# Patient Record
Sex: Male | Born: 1965 | Race: White | Hispanic: No | Marital: Married | Smoking: Never smoker
Health system: Southern US, Community
[De-identification: ages and names within clinical notes are randomized; demographics above are authoritative.]

## PROBLEM LIST (undated history)

## (undated) DIAGNOSIS — R519 Headache, unspecified: Secondary | ICD-10-CM

## (undated) DIAGNOSIS — T7840XA Allergy, unspecified, initial encounter: Secondary | ICD-10-CM

## (undated) DIAGNOSIS — G4730 Sleep apnea, unspecified: Secondary | ICD-10-CM

## (undated) DIAGNOSIS — J309 Allergic rhinitis, unspecified: Secondary | ICD-10-CM

## (undated) DIAGNOSIS — E119 Type 2 diabetes mellitus without complications: Secondary | ICD-10-CM

## (undated) DIAGNOSIS — R51 Headache: Secondary | ICD-10-CM

## (undated) DIAGNOSIS — E781 Pure hyperglyceridemia: Secondary | ICD-10-CM

## (undated) DIAGNOSIS — M109 Gout, unspecified: Secondary | ICD-10-CM

## (undated) HISTORY — DX: Allergic rhinitis, unspecified: J30.9

## (undated) HISTORY — DX: Allergy, unspecified, initial encounter: T78.40XA

## (undated) HISTORY — PX: POLYPECTOMY: SHX149

## (undated) HISTORY — PX: CHOLECYSTECTOMY: SHX55

## (undated) HISTORY — DX: Headache: R51

## (undated) HISTORY — DX: Type 2 diabetes mellitus without complications: E11.9

## (undated) HISTORY — DX: Gout, unspecified: M10.9

## (undated) HISTORY — PX: APPENDECTOMY: SHX54

## (undated) HISTORY — PX: SEPTOPLASTY: SUR1290

## (undated) HISTORY — DX: Headache, unspecified: R51.9

---

## 2007-07-26 HISTORY — PX: ROTATOR CUFF REPAIR: SHX139

## 2012-08-16 ENCOUNTER — Encounter (HOSPITAL_COMMUNITY): Payer: Self-pay | Admitting: Emergency Medicine

## 2012-08-16 ENCOUNTER — Emergency Department (HOSPITAL_COMMUNITY)
Admission: EM | Admit: 2012-08-16 | Discharge: 2012-08-16 | Disposition: A | Discharge status: Home / Self Care | Attending: Emergency Medicine | Admitting: Emergency Medicine

## 2012-08-16 DIAGNOSIS — Z79899 Other long term (current) drug therapy: Secondary | ICD-10-CM | POA: Insufficient documentation

## 2012-08-16 DIAGNOSIS — M545 Low back pain, unspecified: Secondary | ICD-10-CM | POA: Insufficient documentation

## 2012-08-16 DIAGNOSIS — Z9089 Acquired absence of other organs: Secondary | ICD-10-CM | POA: Insufficient documentation

## 2012-08-16 DIAGNOSIS — R109 Unspecified abdominal pain: Secondary | ICD-10-CM | POA: Insufficient documentation

## 2012-08-16 DIAGNOSIS — Z8639 Personal history of other endocrine, nutritional and metabolic disease: Secondary | ICD-10-CM | POA: Insufficient documentation

## 2012-08-16 DIAGNOSIS — Z862 Personal history of diseases of the blood and blood-forming organs and certain disorders involving the immune mechanism: Secondary | ICD-10-CM | POA: Insufficient documentation

## 2012-08-16 DIAGNOSIS — M549 Dorsalgia, unspecified: Secondary | ICD-10-CM

## 2012-08-16 HISTORY — DX: Pure hyperglyceridemia: E78.1

## 2012-08-16 LAB — POCT I-STAT, CHEM 8
BUN: 26 mg/dL — ABNORMAL HIGH (ref 6–23)
Chloride: 109 mEq/L (ref 96–112)
Creatinine, Ser: 1.1 mg/dL (ref 0.50–1.35)
Potassium: 4.2 mEq/L (ref 3.5–5.1)
Sodium: 142 mEq/L (ref 135–145)

## 2012-08-16 LAB — URINALYSIS, ROUTINE W REFLEX MICROSCOPIC
Glucose, UA: 100 mg/dL — AB
Hgb urine dipstick: NEGATIVE
Leukocytes, UA: NEGATIVE
Protein, ur: NEGATIVE mg/dL
Specific Gravity, Urine: 1.026 (ref 1.005–1.030)
Urobilinogen, UA: 1 mg/dL (ref 0.0–1.0)

## 2012-08-16 LAB — CBC WITH DIFFERENTIAL/PLATELET
Basophils Absolute: 0 10*3/uL (ref 0.0–0.1)
Lymphocytes Relative: 28 % (ref 12–46)
Lymphs Abs: 1.1 10*3/uL (ref 0.7–4.0)
Neutro Abs: 2.4 10*3/uL (ref 1.7–7.7)
Platelets: 199 10*3/uL (ref 150–400)
RBC: 5.22 MIL/uL (ref 4.22–5.81)
RDW: 12.9 % (ref 11.5–15.5)
WBC: 4 10*3/uL (ref 4.0–10.5)

## 2012-08-16 MED ORDER — HYDROCODONE-ACETAMINOPHEN 5-325 MG PO TABS: ORAL_TABLET | ORAL | Status: DC

## 2012-08-16 NOTE — ED Notes (Addendum)
Patient with lower back pain, on right flank.  No nausea or vomiting at this time.  No problems urinating.  Urine is clear per patient.  Pain is worse with movement.

## 2012-08-16 NOTE — ED Notes (Signed)
1000cc of NS started wide open

## 2012-08-16 NOTE — ED Notes (Signed)
Report given to Ricky, RN

## 2012-08-16 NOTE — ED Provider Notes (Signed)
History     CSN: [id number]  Arrival date & time 08/16/12  1191   First MD Initiated Contact with Patient 08/16/12 [phone]      Chief Complaint  Patient presents with  . Back Pain    (Consider location/radiation/quality/duration/timing/severity/associated sxs/prior treatment) HPI Comments: Patient presents with complaint of right flank pain over the 4 days that is has been moving from his flank to his right lower abdomen and groin. Pain has been 7/10 at its worst, currently 4/10. No urine or stool changes. No fever, nausea, vomiting, or diarrhea. Patient does not have a history of kidney stones. He has a history of appendectomy. Patient takes Flexeril and Mobic at home for pain and headaches. No other medications. Onset acute. Course is waxing and waning. Nothing makes symptoms better or worse.   Patient is a 47 y.o. male presenting with back pain. The history is provided by the patient and the spouse.  Back Pain  Pertinent negatives include no chest pain, no fever, no headaches, no abdominal pain and no dysuria.    Past Medical History  Diagnosis Date  . Hypertriglyceridemia     Past Surgical History  Procedure Date  . Appendectomy   . Septoplasty     History reviewed. No pertinent family history.  History  Substance Use Topics  . Smoking status: Never Smoker   . Smokeless tobacco: Not on file  . Alcohol Use: No      Review of Systems  Constitutional: Negative for fever.  HENT: Negative for sore throat and rhinorrhea.   Eyes: Negative for redness.  Respiratory: Negative for cough.   Cardiovascular: Negative for chest pain.  Gastrointestinal: Negative for nausea, vomiting, abdominal pain and diarrhea.  Genitourinary: Positive for flank pain. Negative for dysuria, hematuria, decreased urine volume and discharge.  Musculoskeletal: Positive for back pain. Negative for myalgias.  Skin: Negative for rash.  Neurological: Negative for headaches.    Allergies  Review of  patient's allergies indicates no known allergies.  Home Medications   Current Outpatient Rx  Name  Route  Sig  Dispense  Refill  . BACLOFEN 10 MG PO TABS   Oral   Take 10 mg by mouth 3 (three) times daily.         Marland Kitchen CLONAZEPAM 1 MG PO TABS   Oral   Take 1 mg by mouth 2 (two) times daily as needed. For anxiety         . CYCLOBENZAPRINE HCL 10 MG PO TABS   Oral   Take 10 mg by mouth 3 (three) times daily as needed. For pain         . MELOXICAM 15 MG PO TABS   Oral   Take 15 mg by mouth daily.         Marland Kitchen HYDROCODONE-ACETAMINOPHEN 5-325 MG PO TABS      Take 1-2 tablets every 6 hours as needed for severe pain   12 tablet   0     BP 122/62  Pulse 79  Temp 97.9 F (36.6 C) (Oral)  Resp 18  SpO2 98%  Physical Exam  Nursing note and vitals reviewed. Constitutional: He appears well-developed and well-nourished.  HENT:  Head: Normocephalic and atraumatic.  Eyes: Conjunctivae normal are normal. Right eye exhibits no discharge. Left eye exhibits no discharge.  Neck: Normal range of motion. Neck supple.  Cardiovascular: Normal rate, regular rhythm and normal heart sounds.   Pulmonary/Chest: Effort normal and breath sounds normal.  Abdominal: Soft. There is no  tenderness.  Genitourinary: Penis normal. Right testis shows no swelling and no tenderness. Left testis shows no swelling and no tenderness. No discharge found.  Musculoskeletal:       Thoracic back: He exhibits no tenderness and no bony tenderness.       Lumbar back: He exhibits tenderness. He exhibits normal range of motion, no bony tenderness and no swelling.       Back:  Neurological: He is alert.  Skin: Skin is warm and dry.  Psychiatric: He has a normal mood and affect.    ED Course  Procedures (including critical care time)  Labs Reviewed  URINALYSIS, ROUTINE W REFLEX MICROSCOPIC - Abnormal; Notable for the following:    Glucose, UA 100 (*)     All other components within normal limits  POCT  I-STAT, CHEM 8 - Abnormal; Notable for the following:    BUN 26 (*)     Glucose, Bld 131 (*)     All other components within normal limits  CBC WITH DIFFERENTIAL   No results found.   1. Back pain     7:14 AM Patient seen and examined. Work-up initiated. Patient declines pain medication.   Vital signs reviewed and are as follows: Filed Vitals:   08/16/12 0655  BP: 122/62  Pulse: 79  Temp: 97.9 F (36.6 C)  Resp: 18   8:42 AM Updated patient on results. He is pain free unless he moves.   Patient given option of symptomatic management vs CT to ensure no stone. He elects symptomatic management. Will d/c on pain medication and close f/u with his PCP.  Patient urged to return with worsening symptoms or other concerns. Patient verbalized understanding and agrees with plan.   Patient counseled on use of narcotic pain medications. Counseled not to combine these medications with others containing tylenol. Urged not to drink alcohol, drive, or perform any other activities that requires focus while taking these medications. The patient verbalizes understanding and agrees with the plan.   Wife states she will monitor and have patient f/u.    MDM  Patient with back pain, positional, seems musculoskeletal. Nephrolithiasis is also a possibility however given the positional nature with negative UA, normal kidney function, there is no indication for CT at this current time. Patient appears well. Will continue symptomatic control and have patient follow up with his primary care physician. He should and family are in agreement with this plan. Do not suspect PE based on history, risk factors, and age.        Renne Crigler, [place] 08/16/12 [phone]

## 2012-08-16 NOTE — ED Provider Notes (Signed)
Medical screening examination/treatment/procedure(s) were performed by non-physician practitioner and as supervising physician I was immediately available for consultation/collaboration.   Carleene Cooper III, MD 08/16/12 2041

## 2014-06-05 ENCOUNTER — Other Ambulatory Visit (HOSPITAL_COMMUNITY): Payer: Self-pay | Admitting: Respiratory Therapy

## 2014-06-05 DIAGNOSIS — R0602 Shortness of breath: Secondary | ICD-10-CM

## 2014-06-16 ENCOUNTER — Ambulatory Visit (HOSPITAL_COMMUNITY)
Admission: RE | Admit: 2014-06-16 | Discharge: 2014-06-16 | Disposition: A | Discharge status: Home / Self Care | Source: Ambulatory Visit | Attending: Internal Medicine | Admitting: Internal Medicine

## 2014-06-16 DIAGNOSIS — R0602 Shortness of breath: Secondary | ICD-10-CM | POA: Diagnosis present

## 2014-06-16 LAB — PULMONARY FUNCTION TEST
DL/VA % PRED: 125 %
DL/VA: 5.75 ml/min/mmHg/L
DLCO COR: 35.91 ml/min/mmHg
DLCO UNC % PRED: 115 %
DLCO UNC: 35.91 ml/min/mmHg
DLCO cor % pred: 115 %
FEF 25-75 PRE: 4.36 L/s
FEF2575-%Pred-Pre: 127 %
FEV1-%Pred-Pre: 103 %
FEV1-Pre: 3.94 L
FEV1FVC-%PRED-PRE: 106 %
FEV6-%PRED-PRE: 99 %
FEV6-Pre: 4.7 L
FEV6FVC-%PRED-PRE: 102 %
FVC-%Pred-Pre: 96 %
FVC-Pre: 4.73 L
PRE FEV1/FVC RATIO: 83 %
Pre FEV6/FVC Ratio: 99 %
RV % pred: 77 %
RV: 1.52 L
TLC % pred: 93 %
TLC: 6.35 L

## 2014-06-16 MED ORDER — ALBUTEROL SULFATE (2.5 MG/3ML) 0.083% IN NEBU
2.5000 mg | INHALATION_SOLUTION | Freq: Once | RESPIRATORY_TRACT | Status: AC
  Administered 2014-06-16: 2.5 mg via RESPIRATORY_TRACT

## 2014-07-25 LAB — HM COLONOSCOPY

## 2015-09-23 ENCOUNTER — Encounter: Payer: Self-pay | Admitting: Primary Care

## 2015-09-23 ENCOUNTER — Ambulatory Visit (INDEPENDENT_AMBULATORY_CARE_PROVIDER_SITE_OTHER): Admitting: Primary Care

## 2015-09-23 VITALS — BP 144/86 | HR 97 | Temp 98.9°F | Ht 69.0 in | Wt 255.4 lb

## 2015-09-23 DIAGNOSIS — E1165 Type 2 diabetes mellitus with hyperglycemia: Secondary | ICD-10-CM | POA: Insufficient documentation

## 2015-09-23 DIAGNOSIS — J309 Allergic rhinitis, unspecified: Secondary | ICD-10-CM | POA: Diagnosis not present

## 2015-09-23 DIAGNOSIS — R51 Headache: Secondary | ICD-10-CM

## 2015-09-23 DIAGNOSIS — E119 Type 2 diabetes mellitus without complications: Secondary | ICD-10-CM

## 2015-09-23 DIAGNOSIS — E785 Hyperlipidemia, unspecified: Secondary | ICD-10-CM | POA: Insufficient documentation

## 2015-09-23 DIAGNOSIS — Z794 Long term (current) use of insulin: Secondary | ICD-10-CM

## 2015-09-23 DIAGNOSIS — R519 Headache, unspecified: Secondary | ICD-10-CM | POA: Insufficient documentation

## 2015-09-23 NOTE — Assessment & Plan Note (Signed)
No prior history. Symptoms of nasal congestion x 1 month. No improvement with Dymista. Will have him stop Afrin, switch to Flonase, add Zyrtec HS.

## 2015-09-23 NOTE — Assessment & Plan Note (Signed)
Diagnosed 4-5 years ago. Currently managed on Janumet 100/1000, Metformin 1000, and Levemir 45 units HS. Poor diet. Does not exercise. Unsure of his last A1C, believes he may be due, Will have him schedule labs in the next month, in the meantime will obtain records.

## 2015-09-23 NOTE — Patient Instructions (Signed)
Nasal Congestion: Try using Flonase (fluticasone) nasal spray. Instill 2 sprays in each nostril once daily.   Start taking Zyrtec every night at bedtime. This is an antihistamine which will help to reduce your congestion.  I will obtain records from Dr. Julianne Rice office.  Please schedule a physical with me within the next 3-6 months. You may also schedule a lab only appointment 3-4 days prior. We will discuss your lab results in detail during your physical.  It was a pleasure to meet you today! Please don't hesitate to call me with any questions. Welcome to [facility]!

## 2015-09-23 NOTE — Progress Notes (Signed)
Subjective:    Patient ID: Devon Jackson, male    DOB: 04-12-1966, 50 y.o.   MRN: [id number]  HPI  Mr. Wydra is a 50 year old male who presents today to establish care and discuss the problems mentioned below. Will obtain old records. He's unsure of his last physical.   1) Type 2 Diabetes: Diagnosed about 4 years ago. Currently managed on Janumet XR 100/1000mg  once daily, Levemir 45 units HS, and Metformin 1000 mg every morning. His last A1C was about 3 months ago and was approximately 7 per patient.   He endorses a fair diet which consists of: Breakfast: Fast food Lunch: Fast food Dinner: Home cooked meals (casserole, ham, pasta, etc) Snacks: Chips, popcorn, jerky Desserts: None Beverages: Diet pepsi, occasional sweet tea.  Exercise: He does not currently exercise.  2) Frequent Headaches: Chronic for years. He is managed on a few medications for his headaches by prior PCP. He will start by taking Aleve. If no improvement then will take 1 flexeril. If no improvement then will taken an additional flexeril. If his headache persists he will take 1 clonopin tablet.   Headaches occur once weekly on average. Most of the time the aleve will provide relief in pain, and will occasionally have to take flexeril. Headaches are located to the left occipital region mostly. Denies photophobia and nausea. His clonopin RX is from over 1.5 years ago.  3) Gout: Diagnosed years ago. Has a prescription for colchicine 0.6 mg PRN. His last use of the colchicine was about year ago. No recent symptoms.  4) Hyperlipidemia: Diagnosed years ago. Currently managed on fenofibrate for elevated triglycerides. Unsure of last lipid panel. He was told that he was going to be started on a new prescription strength fish oil pill but this required prior authorization from his insurance. He's been trialed on Lovaza, gemfibrozil, and Lipitor in the past. Unable to tolerate Lipitor as it caused moderate muscle cramping.   5)  Allergic Rhinitis: Nasal congestion. Intermittently for the past one month. He's been using Afrin nasal spray and Dymista . The dymista did not help to improve symptoms, the Afrin provides temporary relief. Denies fevers, cough, sinus pressure, prior history of this in the past.  Review of Systems  Constitutional: Negative for fever.  HENT: Positive for congestion.   Respiratory: Negative for shortness of breath.   Cardiovascular: Negative for chest pain.  Musculoskeletal: Negative for arthralgias.  Allergic/Immunologic: Positive for environmental allergies.  Neurological: Positive for headaches. Negative for numbness.       Past Medical History  Diagnosis Date  . Hypertriglyceridemia     Social History   Social History  . Marital Status: Married    Spouse Name: N/A  . Number of Children: N/A  . Years of Education: N/A   Occupational History  . Not on file.   Social History Main Topics  . Smoking status: Never Smoker   . Smokeless tobacco: Not on file  . Alcohol Use: 0.0 oz/week    0 Standard drinks or equivalent per week     Comment: rarely  . Drug Use: No  . Sexual Activity: Not on file   Other Topics Concern  . Not on file   Social History Narrative   Married.   3 children.   Works as a Therapist, nutritional.   Enjoys relaxing, spending time outdoors.    Past Surgical History  Procedure Laterality Date  . Appendectomy    . Septoplasty    . Rotator cuff  repair  2009  . Polypectomy      Family History  Problem Relation Age of Onset  . Alcohol abuse Mother   . Diabetes Father   . Breast cancer Paternal Grandmother   . Lung cancer Paternal Grandfather     Allergies  Allergen Reactions  . Atorvastatin     Other reaction(s): Myalgias (intolerance)    Current Outpatient Prescriptions on File Prior to Visit  Medication Sig Dispense Refill  . clonazePAM (KLONOPIN) 1 MG tablet Take 1 mg by mouth as needed. For anxiety    . cyclobenzaprine (FLEXERIL) 10 MG  tablet Take 10 mg by mouth as needed. For pain    . meloxicam (MOBIC) 15 MG tablet Take 15 mg by mouth daily.     No current facility-administered medications on file prior to visit.    BP 144/86 mmHg  Pulse 97  Temp(Src) 98.9 F (37.2 C) (Oral)  Ht 5\' 9"  (1.753 m)  Wt 255 lb 6.4 oz (115.849 kg)  BMI 37.70 kg/m2  SpO2 94%    Objective:   Physical Exam  Constitutional: He appears well-nourished.  HENT:  Right Ear: Tympanic membrane and ear canal normal.  Left Ear: Tympanic membrane and ear canal normal.  Nose: Mucosal edema present.  Mouth/Throat: Oropharynx is clear and moist.  Neck: Neck supple.  Cardiovascular: Normal rate and regular rhythm.   Pulmonary/Chest: Effort normal and breath sounds normal.  Skin: Skin is warm and dry.  Psychiatric: He has a normal mood and affect.          Assessment & Plan:

## 2015-09-23 NOTE — Progress Notes (Signed)
Pre visit review using our clinic review tool, if applicable. No additional management support is needed unless otherwise documented below in the visit note. 

## 2015-09-23 NOTE — Assessment & Plan Note (Signed)
More so elevation in trigs. Managed on fenofibrate 150 mg. Failed treatment on gemfibrozil, lipitor (myalgias), and lovaza. Will obtain records and repeat lipids at upcoming physical.

## 2015-09-23 NOTE — Assessment & Plan Note (Signed)
Once weekly for years. Takes aleve mostly, flexeril occasionally, clonopin rarely.

## 2015-09-24 ENCOUNTER — Telehealth: Payer: Self-pay | Admitting: Primary Care

## 2015-11-13 ENCOUNTER — Other Ambulatory Visit: Payer: Self-pay

## 2015-11-13 MED ORDER — FREESTYLE LANCETS MISC: Status: DC

## 2015-11-13 MED ORDER — GLUCOSE BLOOD VI STRP: ORAL_STRIP | Status: DC

## 2015-11-13 NOTE — Telephone Encounter (Signed)
V/M left requesting test strips and lancets sent to [facility]. I spoke with pt and he uses freestyle insulinx test strip and lancets and checks BS 2-3 times a day. Pt seen 09/23/2015. Advised pt done.

## 2015-11-18 ENCOUNTER — Other Ambulatory Visit: Payer: Self-pay | Admitting: Primary Care

## 2015-11-18 DIAGNOSIS — Z125 Encounter for screening for malignant neoplasm of prostate: Secondary | ICD-10-CM

## 2015-11-18 DIAGNOSIS — M109 Gout, unspecified: Secondary | ICD-10-CM

## 2015-11-18 DIAGNOSIS — E119 Type 2 diabetes mellitus without complications: Secondary | ICD-10-CM

## 2015-11-18 DIAGNOSIS — E785 Hyperlipidemia, unspecified: Secondary | ICD-10-CM

## 2015-11-20 ENCOUNTER — Other Ambulatory Visit: Payer: Self-pay | Admitting: Primary Care

## 2015-11-20 ENCOUNTER — Telehealth: Payer: Self-pay | Admitting: Primary Care

## 2015-11-20 ENCOUNTER — Encounter: Payer: Self-pay | Admitting: Primary Care

## 2015-11-20 NOTE — Telephone Encounter (Signed)
Will you please check on the status of Mr. Devon Jackson test strips through [facility]? He is having difficulty. Thanks.

## 2015-11-20 NOTE — Telephone Encounter (Signed)
Pt left v/m requesting cb about PA for test strips; see 11/20/15 pt email also.

## 2015-11-23 ENCOUNTER — Other Ambulatory Visit (INDEPENDENT_AMBULATORY_CARE_PROVIDER_SITE_OTHER)

## 2015-11-23 DIAGNOSIS — E119 Type 2 diabetes mellitus without complications: Secondary | ICD-10-CM

## 2015-11-23 DIAGNOSIS — M109 Gout, unspecified: Secondary | ICD-10-CM

## 2015-11-23 DIAGNOSIS — Z125 Encounter for screening for malignant neoplasm of prostate: Secondary | ICD-10-CM | POA: Diagnosis not present

## 2015-11-23 DIAGNOSIS — E785 Hyperlipidemia, unspecified: Secondary | ICD-10-CM

## 2015-11-23 LAB — COMPREHENSIVE METABOLIC PANEL
ALT: 21 U/L (ref 0–53)
AST: 14 U/L (ref 0–37)
Albumin: 4.4 g/dL (ref 3.5–5.2)
Alkaline Phosphatase: 47 U/L (ref 39–117)
BUN: 22 mg/dL (ref 6–23)
CALCIUM: 9.3 mg/dL (ref 8.4–10.5)
CHLORIDE: 105 meq/L (ref 96–112)
CO2: 28 meq/L (ref 19–32)
CREATININE: 1.09 mg/dL (ref 0.40–1.50)
GFR: 75.84 mL/min (ref >60.00)
Glucose, Bld: 165 mg/dL — ABNORMAL HIGH (ref 70–99)
POTASSIUM: 4.6 meq/L (ref 3.5–5.1)
Sodium: 139 mEq/L (ref 135–145)
Total Bilirubin: 0.5 mg/dL (ref 0.2–1.2)
Total Protein: 6.6 g/dL (ref 6.0–8.3)

## 2015-11-23 LAB — URIC ACID: Uric Acid, Serum: 3.6 mg/dL — ABNORMAL LOW (ref 4.0–7.8)

## 2015-11-23 LAB — LIPID PANEL
CHOL/HDL RATIO: 6
CHOLESTEROL: 161 mg/dL (ref 0–200)
HDL: 27.6 mg/dL — AB (ref >39.00)
NonHDL: 133.85
TRIGLYCERIDES: 249 mg/dL — AB (ref 0.0–149.0)
VLDL: 49.8 mg/dL — AB (ref 0.0–40.0)

## 2015-11-23 LAB — MICROALBUMIN / CREATININE URINE RATIO
Creatinine,U: 137.5 mg/dL
Microalb Creat Ratio: 0.5 mg/g (ref 0.0–30.0)

## 2015-11-23 LAB — PSA: PSA: 0.44 ng/mL (ref 0.10–4.00)

## 2015-11-23 LAB — LDL CHOLESTEROL, DIRECT: LDL DIRECT: 107 mg/dL

## 2015-11-23 LAB — HEMOGLOBIN A1C: Hgb A1c MFr Bld: 8.2 % — ABNORMAL HIGH (ref 4.6–6.5)

## 2015-11-23 MED ORDER — GLUCOSE BLOOD VI STRP: ORAL_STRIP | Status: DC

## 2015-11-23 NOTE — Addendum Note (Signed)
Addended by: Jacqualin Combes on: 11/23/2015 11:41 AM   Modules accepted: Orders

## 2015-11-23 NOTE — Telephone Encounter (Signed)
Did not get anything that would be faxed from [facility] for patient. Called [facility] and asked them to re-fax the forms for the PA.

## 2015-11-23 NOTE — Telephone Encounter (Signed)
Received the forms from [place]. Completed the forms and faxed to [facility]. Now waiting on response.

## 2015-11-23 NOTE — Telephone Encounter (Deleted)
Did not get anything from [facility] regarding PA. Will sent PA for the test strips.

## 2015-11-25 MED ORDER — BLOOD GLUCOSE MONITOR KIT: PACK | Status: AC

## 2015-11-25 NOTE — Telephone Encounter (Signed)
Received fax from [place] that the prior auth for freestyle insulinx test strip

## 2015-11-25 NOTE — Telephone Encounter (Signed)
Called and notified patient of the outcome of the prior auth. Patient verbalized understanding. Patient stated that can we send him a new meter and test strips that the insurance would cover. Per Anda Kraft, okay to send.

## 2015-11-25 NOTE — Telephone Encounter (Signed)
Already faxed the Rx for the new meter and test strips.

## 2015-11-26 ENCOUNTER — Ambulatory Visit (INDEPENDENT_AMBULATORY_CARE_PROVIDER_SITE_OTHER): Admitting: Primary Care

## 2015-11-26 ENCOUNTER — Encounter: Payer: Self-pay | Admitting: Primary Care

## 2015-11-26 VITALS — BP 126/80 | HR 82 | Temp 98.2°F | Ht 69.0 in | Wt 255.4 lb

## 2015-11-26 DIAGNOSIS — E785 Hyperlipidemia, unspecified: Secondary | ICD-10-CM | POA: Diagnosis not present

## 2015-11-26 DIAGNOSIS — J309 Allergic rhinitis, unspecified: Secondary | ICD-10-CM

## 2015-11-26 DIAGNOSIS — Z0001 Encounter for general adult medical examination with abnormal findings: Secondary | ICD-10-CM | POA: Insufficient documentation

## 2015-11-26 DIAGNOSIS — E119 Type 2 diabetes mellitus without complications: Secondary | ICD-10-CM

## 2015-11-26 DIAGNOSIS — Z794 Long term (current) use of insulin: Secondary | ICD-10-CM

## 2015-11-26 DIAGNOSIS — Z0000 Encounter for general adult medical examination without abnormal findings: Secondary | ICD-10-CM | POA: Insufficient documentation

## 2015-11-26 MED ORDER — METFORMIN HCL 1000 MG PO TABS: 1000.0000 mg | ORAL_TABLET | Freq: Every day | ORAL | Status: DC

## 2015-11-26 MED ORDER — JANUMET XR 100-1000 MG PO TB24: 1.0000 | ORAL_TABLET | Freq: Every day | ORAL | Status: DC

## 2015-11-26 MED ORDER — INSULIN DETEMIR 100 UNIT/ML ~~LOC~~ SOLN: SUBCUTANEOUS | Status: DC

## 2015-11-26 MED ORDER — MONTELUKAST SODIUM 10 MG PO TABS: 10.0000 mg | ORAL_TABLET | Freq: Every day | ORAL | Status: DC

## 2015-11-26 NOTE — Assessment & Plan Note (Signed)
TC stable, LDL slightly above goal, Trigs at 250. Managed on fenofibrate. He's failed Lovaza, Gemfibrozil, and experienced allergic reaction to Lipitor in the past. Will continue Fenofibrate now, add in OTC fish oil 1000 mg TID. Repeat Lipids in 3 months. If no improvement, will need to consider Crestor.  Discussed the importance of a healthy diet and regular exercise in order for weight loss and to reduce risk of other medical diseases.

## 2015-11-26 NOTE — Assessment & Plan Note (Signed)
A1C of 8.2 on recent labs which is worse from prior. Poor diet and does not regularly exercise. Will increase Levemir to 15 units in the morning and continue 45 units in the evening. Continue JanuMet and Metformin. Declines pneumonia vaccination today. Urine microalbumin on file and is unremarkable. Not managed on Ace. Cannot tolerate statins.  Discussed the importance of a healthy diet and regular exercise in order for weight loss and to reduce risk of other medical diseases.  Will repeat A1C in 3 months.

## 2015-11-26 NOTE — Progress Notes (Signed)
Subjective:    Patient ID: Devon Jackson, male    DOB: 1965/08/27, 50 y.o.   MRN: [id number]  HPI  Devon Jackson is a 50 year old male who presents today for complete physical.  Immunizations: -Tetanus: Unsure believes it's been within 10 years -Influenza: Did not complete last season -Pneumonia: Never completed.   Diet: He endorses a healthy diet. Breakfast: Sausage egg Mcmuffin, hashbrowns, soda Lunch: Fast food Dinner: Chicken casseroles, beef tips, vegetables Snacks: Hot fries, popcorn, pickles, hot dogs Desserts: Occasionally Beverages: Diet sodas, sweet tea, no water  Exercise: He does some mild weight lifting some days weekly. Eye exam: Completed 2 years ago, due again in June. Dental exam: Completed yesterday. Colonoscopy: Completed in 2016, due for repeat in 1 year.  1) Cough: Present for 1 week. Had cold symptoms for the past 2 weeks which resolved. Overall he's feeling better from the cold and does not feel sick today. He continues to experience nasal congestion, sneezing. He has been using his albuterol inhaler for the past several days with temporary improvement as he can get a deeper breath in to cough. Denies fevers. He's taken Sudafed, Flonase, and Zyrtec in the past without improvement.   Review of Systems  Constitutional: Positive for fatigue. Negative for fever.  HENT: Positive for congestion and rhinorrhea.   Respiratory: Positive for cough, shortness of breath and wheezing.   Cardiovascular: Negative for chest pain.  Gastrointestinal: Negative for diarrhea and constipation.  Genitourinary: Negative for difficulty urinating.  Musculoskeletal: Negative for myalgias and arthralgias.  Skin: Negative for rash.  Allergic/Immunologic: Positive for environmental allergies.  Neurological: Negative for dizziness, numbness and headaches.  Psychiatric/Behavioral:       Denies concerns with anxiety and depression       Past Medical History  Diagnosis Date  .  Hypertriglyceridemia   . Gout   . Frequent headaches   . Type 2 diabetes mellitus ([place])   . Allergic rhinitis      Social History   Social History  . Marital Status: Married    Spouse Name: N/A  . Number of Children: N/A  . Years of Education: N/A   Occupational History  . Not on file.   Social History Main Topics  . Smoking status: Never Smoker   . Smokeless tobacco: Not on file  . Alcohol Use: 0.0 oz/week    0 Standard drinks or equivalent per week     Comment: rarely  . Drug Use: No  . Sexual Activity: Not on file   Other Topics Concern  . Not on file   Social History Narrative   Married.   3 children.   Works as a Therapist, nutritional.   Enjoys relaxing, spending time outdoors.    Past Surgical History  Procedure Laterality Date  . Appendectomy    . Septoplasty    . Rotator cuff repair  2009  . Polypectomy      Family History  Problem Relation Age of Onset  . Alcohol abuse Mother   . Diabetes Father   . Breast cancer Paternal Grandmother   . Lung cancer Paternal Grandfather     Allergies  Allergen Reactions  . Atorvastatin     Other reaction(s): Myalgias (intolerance)    Current Outpatient Prescriptions on File Prior to Visit  Medication Sig Dispense Refill  . albuterol (PROAIR HFA) 108 (90 Base) MCG/ACT inhaler Inhale 2 puffs into the lungs every 4 (four) hours as needed.     . blood glucose meter  kit and supplies KIT Dispense based on patient and insurance preference. Use up to 3 times daily as directed. (Dx is E11.9). 1 each 0  . CIALIS 20 MG tablet Take 20 mg by mouth daily as needed.     . clonazePAM (KLONOPIN) 1 MG tablet Take 1 mg by mouth as needed. For anxiety    . clotrimazole-betamethasone (LOTRISONE) cream Apply topically.    . colchicine 0.6 MG tablet Take 0.6 mg by mouth as needed.     . cyclobenzaprine (FLEXERIL) 10 MG tablet Take 10 mg by mouth as needed. For pain    . DEXILANT 60 MG capsule Take 60 mg by mouth as needed.     .  Fenofibrate 150 MG CAPS Take 150 mg by mouth at bedtime.     Marland Kitchen glucose blood (FREESTYLE INSULINX TEST) test strip Test blood sugar 2-3 times daily and as directed. 100 each 5  . insulin detemir (LEVEMIR) 100 UNIT/ML injection Inject 45 Units into the skin at bedtime.    Marland Kitchen JANUMET XR [phone] MG TB24 Take 1 tablet by mouth at bedtime.     . Lancets (FREESTYLE) lancets Test blood sugar 2-3 times daily and as directed. Dx E11.9 300 each 1  . meloxicam (MOBIC) 15 MG tablet Take 15 mg by mouth daily.     No current facility-administered medications on file prior to visit.    BP 126/80 mmHg  Pulse 82  Temp(Src) 98.2 F (36.8 C) (Oral)  Ht _0  (1.753 m)  Wt 255 lb 6.4 oz (115.849 kg)  BMI 37.70 kg/m2  SpO2 96%    Objective:   Physical Exam  Constitutional: He is oriented to person, place, and time. He appears well-nourished.  HENT:  Right Ear: Tympanic membrane and ear canal normal.  Left Ear: Tympanic membrane and ear canal normal.  Nose: Nose normal. Right sinus exhibits no maxillary sinus tenderness and no frontal sinus tenderness. Left sinus exhibits no maxillary sinus tenderness and no frontal sinus tenderness.  Mouth/Throat: Oropharynx is clear and moist.  Eyes: Conjunctivae and EOM are normal. Pupils are equal, round, and reactive to light.  Neck: Neck supple. Carotid bruit is not present. No thyromegaly present.  Cardiovascular: Normal rate, regular rhythm and normal heart sounds.   Pulmonary/Chest: Effort normal and breath sounds normal. He has no wheezes. He has no rales.  Cough present during exam.  Abdominal: Soft. Bowel sounds are normal. There is no tenderness.  Musculoskeletal: Normal range of motion.  Neurological: He is alert and oriented to person, place, and time. He has normal reflexes. No cranial nerve deficit.  Skin: Skin is warm and dry.  Psychiatric: He has a normal mood and affect.          Assessment & Plan:

## 2015-11-26 NOTE — Progress Notes (Signed)
Pre visit review using our clinic review tool, if applicable. No additional management support is needed unless otherwise documented below in the visit note. 

## 2015-11-26 NOTE — Assessment & Plan Note (Signed)
No improvement with Zyrtec and Flonase. Now has cough x 1 week without evidence of viral or bacterial involvement. Will try Singulair 10 mg HS. He is to update me if no improvement.

## 2015-11-26 NOTE — Assessment & Plan Note (Signed)
Td UTD per patient, declines pneumovax. Colonoscopy completed in late 2016, due for repeat in 1 year due to numerous polyps. Poor diet and does not exercise. Stressed the importance of both, especially given diabetes and hyperlipidemia.  Exam with dry cough and evidence of allergic rhinitis. Labs with hyperglycemia and hyperlipidemia.  Follow up in 1 year for repeat physical, follow up in 6 months for re-evaluation of chronic conditions, repeat labs in 3 months.

## 2015-11-26 NOTE — Patient Instructions (Addendum)
Your blood sugars are too high. You MUST improve your diet.  Please limit carbohydrates in the form of white bread, rice, fast food, fried foods, sweets, sodas, etc. Increase your consumption of fresh fruits and vegetables, lean protein, and water.  Ensure you are consuming 64 ounces of water daily.  Start exercising. You should be getting 1 hour of moderate intensity exercise 5 days weekly.  Increase Levemir to 15 units in the morning. Continue 45 units in the evening.  Continue to check your sugars and notify me if you get any readings below 80.  Start Fish Oil 1000 mg three times daily with meals.   Complete xray(s) prior to leaving today. I will notify you of your results once received.  Schedule a lab only appointment in 3 months for re-evaluation of diabetes and cholesterol.  Follow up with me in 6 months for re-evaluation.  Diabetes Mellitus and Food It is important for you to manage your blood sugar (glucose) level. Your blood glucose level can be greatly affected by what you eat. Eating healthier foods in the appropriate amounts throughout the day at about the same time each day will help you control your blood glucose level. It can also help slow or prevent worsening of your diabetes mellitus. Healthy eating may even help you improve the level of your blood pressure and reach or maintain a healthy weight.  General recommendations for healthful eating and cooking habits include:  Eating meals and snacks regularly. Avoid going long periods of time without eating to lose weight.  Eating a diet that consists mainly of plant-based foods, such as fruits, vegetables, nuts, legumes, and whole grains.  Using low-heat cooking methods, such as baking, instead of high-heat cooking methods, such as deep frying. Work with your dietitian to make sure you understand how to use the Nutrition Facts information on food labels. HOW CAN FOOD AFFECT ME? Carbohydrates Carbohydrates affect your  blood glucose level more than any other type of food. Your dietitian will help you determine how many carbohydrates to eat at each meal and teach you how to count carbohydrates. Counting carbohydrates is important to keep your blood glucose at a healthy level, especially if you are using insulin or taking certain medicines for diabetes mellitus. Alcohol Alcohol can cause sudden decreases in blood glucose (hypoglycemia), especially if you use insulin or take certain medicines for diabetes mellitus. Hypoglycemia can be a life-threatening condition. Symptoms of hypoglycemia (sleepiness, dizziness, and disorientation) are similar to symptoms of having too much alcohol.  If your health care provider has given you approval to drink alcohol, do so in moderation and use the following guidelines:  Women should not have more than one drink per day, and men should not have more than two drinks per day. One drink is equal to:  12 oz of beer.  5 oz of wine.  1 oz of hard liquor.  Do not drink on an empty stomach.  Keep yourself hydrated. Have water, diet soda, or unsweetened iced tea.  Regular soda, juice, and other mixers might contain a lot of carbohydrates and should be counted. WHAT FOODS ARE NOT RECOMMENDED? As you make food choices, it is important to remember that all foods are not the same. Some foods have fewer nutrients per serving than other foods, even though they might have the same number of calories or carbohydrates. It is difficult to get your body what it needs when you eat foods with fewer nutrients. Examples of foods that you should avoid  that are high in calories and carbohydrates but low in nutrients include:  Trans fats (most processed foods list trans fats on the Nutrition Facts label).  Regular soda.  Juice.  Candy.  Sweets, such as cake, pie, doughnuts, and cookies.  Fried foods. WHAT FOODS CAN I EAT? Eat nutrient-rich foods, which will nourish your body and keep you  healthy. The food you should eat also will depend on several factors, including:  The calories you need.  The medicines you take.  Your weight.  Your blood glucose level.  Your blood pressure level.  Your cholesterol level. You should eat a variety of foods, including:  Protein.  Lean cuts of meat.  Proteins low in saturated fats, such as fish, egg whites, and beans. Avoid processed meats.  Fruits and vegetables.  Fruits and vegetables that may help control blood glucose levels, such as apples, mangoes, and yams.  Dairy products.  Choose fat-free or low-fat dairy products, such as milk, yogurt, and cheese.  Grains, bread, pasta, and rice.  Choose whole grain products, such as multigrain bread, whole oats, and brown rice. These foods may help control blood pressure.  Fats.  Foods containing healthful fats, such as nuts, avocado, olive oil, canola oil, and fish. DOES EVERYONE WITH DIABETES MELLITUS HAVE THE SAME MEAL PLAN? Because every person with diabetes mellitus is different, there is not one meal plan that works for everyone. It is very important that you meet with a dietitian who will help you create a meal plan that is just right for you.   This information is not intended to replace advice given to you by your health care provider. Make sure you discuss any questions you have with your health care provider.   Document Released: 04/07/2005 Document Revised: 08/01/2014 Document Reviewed: 06/07/2013 Elsevier Interactive Patient Education [employer].

## 2015-11-27 ENCOUNTER — Other Ambulatory Visit: Payer: Self-pay | Admitting: Primary Care

## 2015-11-27 DIAGNOSIS — R0602 Shortness of breath: Secondary | ICD-10-CM

## 2015-11-27 MED ORDER — ALBUTEROL SULFATE HFA 108 (90 BASE) MCG/ACT IN AERS: 2.0000 | INHALATION_SPRAY | Freq: Four times a day (QID) | RESPIRATORY_TRACT | Status: DC | PRN

## 2015-12-04 ENCOUNTER — Encounter: Payer: Self-pay | Admitting: Primary Care

## 2015-12-10 ENCOUNTER — Telehealth: Payer: Self-pay | Admitting: Primary Care

## 2015-12-10 NOTE — Telephone Encounter (Signed)
Message left for patient to return my call. Also sent patient a MyChart message. 

## 2015-12-10 NOTE — Telephone Encounter (Signed)
-----   Message from Pleas Koch, NP sent at 11/26/2015 12:05 PM EDT ----- Regarding: Blood sugars Will you please call and check on Mr. Devon Jackson blood sugars?

## 2015-12-11 NOTE — Telephone Encounter (Signed)
Noted  

## 2015-12-11 NOTE — Telephone Encounter (Signed)
Patient stated that he just received test strips about a week ago. The few times I have tested it was 120, 140 , and 170.

## 2015-12-14 ENCOUNTER — Encounter: Payer: Self-pay | Admitting: Primary Care

## 2015-12-15 ENCOUNTER — Other Ambulatory Visit: Payer: Self-pay | Admitting: Primary Care

## 2015-12-15 DIAGNOSIS — J309 Allergic rhinitis, unspecified: Secondary | ICD-10-CM

## 2015-12-15 DIAGNOSIS — R0602 Shortness of breath: Secondary | ICD-10-CM

## 2015-12-15 MED ORDER — BD PEN NEEDLE NANO U/F 32G X 4 MM MISC: Status: DC

## 2015-12-15 MED ORDER — MONTELUKAST SODIUM 10 MG PO TABS: 10.0000 mg | ORAL_TABLET | Freq: Every day | ORAL | Status: DC

## 2015-12-15 MED ORDER — ALBUTEROL SULFATE HFA 108 (90 BASE) MCG/ACT IN AERS: 2.0000 | INHALATION_SPRAY | Freq: Four times a day (QID) | RESPIRATORY_TRACT | Status: DC | PRN

## 2015-12-15 NOTE — Telephone Encounter (Signed)
Received faxed refill request from [facility] for montelukast 10 mg and proair inhaler to 90 days supply.  Sent to refills to [facility].

## 2016-02-02 ENCOUNTER — Encounter: Payer: Self-pay | Admitting: Primary Care

## 2016-02-03 ENCOUNTER — Other Ambulatory Visit: Payer: Self-pay | Admitting: Primary Care

## 2016-02-03 DIAGNOSIS — Z794 Long term (current) use of insulin: Principal | ICD-10-CM

## 2016-02-03 DIAGNOSIS — E119 Type 2 diabetes mellitus without complications: Secondary | ICD-10-CM

## 2016-02-03 MED ORDER — INSULIN DETEMIR 100 UNIT/ML ~~LOC~~ SOLN: SUBCUTANEOUS | Status: DC

## 2016-02-05 ENCOUNTER — Other Ambulatory Visit: Payer: Self-pay | Admitting: Primary Care

## 2016-02-05 DIAGNOSIS — E119 Type 2 diabetes mellitus without complications: Secondary | ICD-10-CM

## 2016-02-05 DIAGNOSIS — Z794 Long term (current) use of insulin: Principal | ICD-10-CM

## 2016-02-08 NOTE — Telephone Encounter (Signed)
Received faxed refill request for Levemir Flextouch Pen 3 mL from [facility].  According to the fax, reason for clarification:  The quantity originally prescribed is less than allowed on the patient's prescription plan. Increasing the days supply may allow the patient to take full advantage of their plan benefits.

## 2016-02-09 MED ORDER — INSULIN DETEMIR 100 UNIT/ML FLEXPEN: PEN_INJECTOR | SUBCUTANEOUS | Status: DC

## 2016-02-25 ENCOUNTER — Other Ambulatory Visit: Payer: Self-pay | Admitting: Primary Care

## 2016-02-25 DIAGNOSIS — E785 Hyperlipidemia, unspecified: Secondary | ICD-10-CM

## 2016-02-25 NOTE — Telephone Encounter (Signed)
Received faxed refill request for  Fenofibrate 150 mg capsules Take 1 capsule by mouth at bedtime.   Medication have not been prescribed by [facility]. Last seen on 11/26/2015. Next lab appt on 03/03/2016.

## 2016-02-28 MED ORDER — FENOFIBRATE 150 MG PO CAPS: 150.0000 mg | ORAL_CAPSULE | Freq: Every day | ORAL | 1 refills | Status: DC

## 2016-03-03 ENCOUNTER — Other Ambulatory Visit (INDEPENDENT_AMBULATORY_CARE_PROVIDER_SITE_OTHER)

## 2016-03-03 DIAGNOSIS — Z794 Long term (current) use of insulin: Secondary | ICD-10-CM

## 2016-03-03 DIAGNOSIS — E119 Type 2 diabetes mellitus without complications: Secondary | ICD-10-CM | POA: Diagnosis not present

## 2016-03-03 DIAGNOSIS — E785 Hyperlipidemia, unspecified: Secondary | ICD-10-CM | POA: Diagnosis not present

## 2016-03-03 LAB — LIPID PANEL
CHOL/HDL RATIO: 5
Cholesterol: 175 mg/dL (ref 0–200)
HDL: 32 mg/dL — ABNORMAL LOW (ref >39.00)
NONHDL: 143.07
Triglycerides: 372 mg/dL — ABNORMAL HIGH (ref 0.0–149.0)
VLDL: 74.4 mg/dL — ABNORMAL HIGH (ref 0.0–40.0)

## 2016-03-03 LAB — LDL CHOLESTEROL, DIRECT: LDL DIRECT: 97 mg/dL

## 2016-03-03 LAB — HEMOGLOBIN A1C: HEMOGLOBIN A1C: 8 % — AB (ref 4.6–6.5)

## 2016-03-07 ENCOUNTER — Ambulatory Visit (INDEPENDENT_AMBULATORY_CARE_PROVIDER_SITE_OTHER): Admitting: Primary Care

## 2016-03-07 ENCOUNTER — Encounter: Payer: Self-pay | Admitting: Primary Care

## 2016-03-07 VITALS — BP 126/82 | HR 83 | Temp 97.7°F | Ht 69.0 in | Wt 260.4 lb

## 2016-03-07 DIAGNOSIS — E119 Type 2 diabetes mellitus without complications: Secondary | ICD-10-CM | POA: Diagnosis not present

## 2016-03-07 DIAGNOSIS — R519 Headache, unspecified: Secondary | ICD-10-CM

## 2016-03-07 DIAGNOSIS — E785 Hyperlipidemia, unspecified: Secondary | ICD-10-CM | POA: Diagnosis not present

## 2016-03-07 DIAGNOSIS — R51 Headache: Secondary | ICD-10-CM | POA: Diagnosis not present

## 2016-03-07 DIAGNOSIS — K219 Gastro-esophageal reflux disease without esophagitis: Secondary | ICD-10-CM | POA: Diagnosis not present

## 2016-03-07 DIAGNOSIS — J309 Allergic rhinitis, unspecified: Secondary | ICD-10-CM

## 2016-03-07 DIAGNOSIS — Z794 Long term (current) use of insulin: Secondary | ICD-10-CM

## 2016-03-07 MED ORDER — CLONAZEPAM 1 MG PO TABS: 1.0000 mg | ORAL_TABLET | ORAL | 0 refills | Status: DC | PRN

## 2016-03-07 MED ORDER — DEXILANT 60 MG PO CPDR: 60.0000 mg | DELAYED_RELEASE_CAPSULE | ORAL | 3 refills | Status: DC | PRN

## 2016-03-07 MED ORDER — CHOLESTYRAMINE 4 G PO PACK: 4.0000 g | PACK | Freq: Two times a day (BID) | ORAL | 1 refills | Status: DC

## 2016-03-07 MED ORDER — INSULIN DETEMIR 100 UNIT/ML FLEXPEN: PEN_INJECTOR | SUBCUTANEOUS | 1 refills | Status: DC

## 2016-03-07 NOTE — Assessment & Plan Note (Signed)
Slightly improved, but still over goal, especially given age. Would like to see him less than 7. Long discussion today regarding importance of diabetic diet and regular exercise as this is what's holding him back.   Increase Levemir to 20 units in the morning, continue 45 HS. Also advised to check sugars BID and record readings.  Repeat A1C in 3 months.

## 2016-03-07 NOTE — Progress Notes (Signed)
Subjective:    Patient ID: Devon Jackson, male    DOB: 1965-11-20, 50 y.o.   MRN: [id number]  HPI  [name] is a 50 year old male who presents today for follow up of recent labs.  1) Type 2 Diabetes: Recent A1C of 8.0. He is currently managed on Levemir 15 units in the morning and 45 units in the evening; Janumet, and Metformin. Last visit he was encouraged to work on improvements in his diet and to start exercising.   He's not checking his blood sugars routinely, only sparingly. He continues to eat fast food, starchy foods, fried foods and is not exercising. He is aware of what constitutes a diabetic diet. He also works in a sedentary job with little exercise.  2) Hyperlipidemia: Recent TC and LDL stable, Triglycerides of 372, which has increased over 100 points since last visit. He is managed on Fenofibrate and Fish Oil. He cannot tolerate Lipitor due to myalgias/hives in the past. He's also failed Lovaza and Gemfibrozil.   He's currently taking Fish Oil 3000 mg and Fenofibrate. He continues to eat fast food 1-2 times daily and does not exercise. He's never tried cholestyramine for lipid control in the past.  3) Frequent Headaches: Long history of tension headaches, overall well managed. He will typically take Aleve as needed, and Flexeril if no improvement after Aleve. He has a prescription for Clonazepam that he uses very sparingly as needed for severe headaches. He is requesting a refill of this medication today. His last refill was 1 year ago per patient.  4) Allergic Rhinitis/Nasal Congestion: No improvement with Singulair that was initiated last visit. Also failed Zyrtec and Flonase. Continues to suffer from congestion to the left nostril with difficulty blowing out. He also sleeps with a Cpap and has had to remove due to decreased inability to breathe out of his left nostril.  Review of Systems  HENT: Positive for congestion. Negative for rhinorrhea and sinus pressure.     Respiratory: Negative for shortness of breath.   Cardiovascular: Negative for chest pain.  Neurological: Negative for dizziness and numbness.       Headaches overall stable.       Past Medical History:  Diagnosis Date  . Allergic rhinitis   . Frequent headaches   . Gout   . Hypertriglyceridemia   . Type 2 diabetes mellitus ([place])      Social History   Social History  . Marital status: Married    Spouse name: N/A  . Number of children: N/A  . Years of education: N/A   Occupational History  . Not on file.   Social History Main Topics  . Smoking status: Never Smoker  . Smokeless tobacco: Not on file  . Alcohol use 0.0 oz/week     Comment: rarely  . Drug use: No  . Sexual activity: Not on file   Other Topics Concern  . Not on file   Social History Narrative   Married.   3 children.   Works as a Therapist, nutritional.   Enjoys relaxing, spending time outdoors.    Past Surgical History:  Procedure Laterality Date  . APPENDECTOMY    . POLYPECTOMY    . ROTATOR CUFF REPAIR  2009  . SEPTOPLASTY      Family History  Problem Relation Age of Onset  . Alcohol abuse Mother   . Diabetes Father   . Breast cancer Paternal Grandmother   . Lung cancer Paternal Grandfather  Allergies  Allergen Reactions  . Atorvastatin     Other reaction(s): Myalgias (intolerance)    Current Outpatient Prescriptions on File Prior to Visit  Medication Sig Dispense Refill  . albuterol (PROAIR HFA) 108 (90 Base) MCG/ACT inhaler Inhale 2 puffs into the lungs every 6 (six) hours as needed. 24 g 4  . BD PEN NEEDLE NANO U/F 32G X 4 MM MISC Use to check blood sugar 300 each 4  . blood glucose meter kit and supplies KIT Dispense based on patient and insurance preference. Use up to 3 times daily as directed. (Dx is E11.9). 1 each 0  . CIALIS 20 MG tablet Take 20 mg by mouth daily as needed.     . clotrimazole-betamethasone (LOTRISONE) cream Apply topically.    . colchicine 0.6 MG tablet Take  0.6 mg by mouth as needed.     . cyclobenzaprine (FLEXERIL) 10 MG tablet Take 10 mg by mouth as needed. For pain    . Fenofibrate 150 MG CAPS Take 1 capsule (150 mg total) by mouth at bedtime. 90 each 1  . glucose blood (FREESTYLE INSULINX TEST) test strip Test blood sugar 2-3 times daily and as directed. 100 each 5  . insulin detemir (LEVEMIR) 100 UNIT/ML injection Inject 15 units in the morning and 45 units at bedtime. 30 mL 3  . JANUMET XR [phone] MG TB24 Take 1 tablet by mouth at bedtime. 90 tablet 3  . Lancets (FREESTYLE) lancets Test blood sugar 2-3 times daily and as directed. Dx E11.9 300 each 1  . meloxicam (MOBIC) 15 MG tablet Take 15 mg by mouth daily.    . metFORMIN (GLUCOPHAGE) 1000 MG tablet Take 1 tablet (1,000 mg total) by mouth at bedtime. 90 tablet 3  . montelukast (SINGULAIR) 10 MG tablet Take 1 tablet (10 mg total) by mouth at bedtime. (Patient not taking: Reported on 03/07/2016) 90 tablet 3   No current facility-administered medications on file prior to visit.     BP 126/82   Pulse 83   Temp 97.7 F (36.5 C) (Oral)   Ht 5' 9"  (1.753 m)   Wt 260 lb 6.4 oz (118.1 kg)   SpO2 96%   BMI 38.45 kg/m    Objective:   Physical Exam  Constitutional: He appears well-nourished.  HENT:  Right Ear: Tympanic membrane and ear canal normal.  Left Ear: Tympanic membrane and ear canal normal.  Nose: No mucosal edema. Right sinus exhibits no maxillary sinus tenderness and no frontal sinus tenderness. Left sinus exhibits no maxillary sinus tenderness and no frontal sinus tenderness.  Mouth/Throat: Oropharynx is clear and moist.  No obvious obstruction to nasal passageways.   Eyes: Conjunctivae are normal.  Neck: Neck supple.  Cardiovascular: Normal rate and regular rhythm.   Pulmonary/Chest: Effort normal and breath sounds normal. He has no wheezes. He has no rales.  Skin: Skin is warm and dry.          Assessment & Plan:

## 2016-03-07 NOTE — Assessment & Plan Note (Signed)
Trigs increased from 249 to 372. Stop Fish Oil as this is not working. Continue fenofibrate, start cholestyramine.  Cannot tolerate statins, failed Lovaza and Gemfibrozil in the past. Discussed the importance of a healthy diet and regular exercise in order for weight loss and to reduce cholesterol.3  Repeat lipids in 3 months.

## 2016-03-07 NOTE — Patient Instructions (Signed)
We've increased your Levemir to 20 units in the morning and continued 45 units in the evening.  You must check your blood sugars at least twice daily (once in the morning before breakfast and also in the evening 2 hours after dinner). Please keep track of these readings and notify me of levels above 200 or below 80 consistently.  Stop Fish Oil. Continue Fenofibrate. Start cholestyramine 4 g packets. Take 1 packet by mouth twice daily with food.   It is important that you improve your diet. Please limit carbohydrates in the form of white bread, rice, pasta, fast food, junk food, sugary drinks, etc. Increase your consumption of fresh fruits and vegetables, whole grains, lean protein.  Ensure you are consuming 64 ounces of water daily.  Start exercising. You should be getting 1 hour of moderate intensity exercise 3-5 days weekly.  Schedule a lab only appointment in 3 months for re-evaluation of cholesterol and diabetes.   It was a pleasure to see you today!  Food Choices to Lower Your Triglycerides Triglycerides are a type of fat in your blood. High levels of triglycerides can increase the risk of heart disease and stroke. If your triglyceride levels are high, the foods you eat and your eating habits are very important. Choosing the right foods can help lower your triglycerides.  WHAT GENERAL GUIDELINES DO I NEED TO FOLLOW?  Lose weight if you are overweight.   Limit or avoid alcohol.   Fill one half of your plate with vegetables and green salads.   Limit fruit to two servings a day. Choose fruit instead of juice.   Make one fourth of your plate whole grains. Look for the word "whole" as the first word in the ingredient list.  Fill one fourth of your plate with lean protein foods.  Enjoy fatty fish (such as salmon, mackerel, sardines, and tuna) three times a week.   Choose healthy fats.   Limit foods high in starch and sugar.  Eat more home-cooked food and less restaurant,  buffet, and fast food.  Limit fried foods.  Cook foods using methods other than frying.  Limit saturated fats.  Check ingredient lists to avoid foods with partially hydrogenated oils (trans fats) in them. WHAT FOODS CAN I EAT?  Grains Whole grains, such as whole wheat or whole grain breads, crackers, cereals, and pasta. Unsweetened oatmeal, bulgur, barley, quinoa, or brown rice. Corn or whole wheat flour tortillas.  Vegetables Fresh or frozen vegetables (raw, steamed, roasted, or grilled). Green salads. Fruits All fresh, canned (in natural juice), or frozen fruits. Meat and Other Protein Products Ground beef (85% or leaner), grass-fed beef, or beef trimmed of fat. Skinless chicken or [place]. Ground chicken or [place]. Pork trimmed of fat. All fish and seafood. Eggs. Dried beans, peas, or lentils. Unsalted nuts or seeds. Unsalted canned or dry beans. Dairy Low-fat dairy products, such as skim or 1% milk, 2% or reduced-fat cheeses, low-fat ricotta or cottage cheese, or plain low-fat yogurt. Fats and Oils Tub margarines without trans fats. Light or reduced-fat mayonnaise and salad dressings. Avocado. Safflower, olive, or canola oils. Natural peanut or almond butter. The items listed above may not be a complete list of recommended foods or beverages. Contact your dietitian for more options. WHAT FOODS ARE NOT RECOMMENDED?  Grains White bread. White pasta. White rice. Cornbread. Bagels, pastries, and croissants. Crackers that contain trans fat. Vegetables White potatoes. Corn. Creamed or fried vegetables. Vegetables in a cheese sauce. Fruits Dried fruits. Canned fruit in  light or heavy syrup. Fruit juice. Meat and Other Protein Products Fatty cuts of meat. Ribs, chicken wings, bacon, sausage, bologna, salami, chitterlings, fatback, hot dogs, bratwurst, and packaged luncheon meats. Dairy Whole or 2% milk, cream, half-and-half, and cream cheese. Whole-fat or sweetened yogurt. Full-fat  cheeses. Nondairy creamers and whipped toppings. Processed cheese, cheese spreads, or cheese curds. Sweets and Desserts Corn syrup, sugars, honey, and molasses. Candy. Jam and jelly. Syrup. Sweetened cereals. Cookies, pies, cakes, donuts, muffins, and ice cream. Fats and Oils Butter, stick margarine, lard, shortening, ghee, or bacon fat. Coconut, palm kernel, or palm oils. Beverages Alcohol. Sweetened drinks (such as sodas, lemonade, and fruit drinks or punches). The items listed above may not be a complete list of foods and beverages to avoid. Contact your dietitian for more information.   This information is not intended to replace advice given to you by your health care provider. Make sure you discuss any questions you have with your health care provider.   Document Released: 04/28/2004 Document Revised: 08/01/2014 Document Reviewed: 05/15/2013 Elsevier Interactive Patient Education [employer].

## 2016-03-07 NOTE — Assessment & Plan Note (Signed)
Overall stable. Requesting refill of Clonazepam that he uses very sparingly for severe headaches. Discussed this is to be used rarely. Refill of 30 tablets provided today which should last at least 6 months.

## 2016-03-07 NOTE — Assessment & Plan Note (Signed)
Patient describing today what sounds to be nasal obstruction/anatomical problem. No obvious obstruction noted on examination today. Discussed options and he would like to defer further evaluation for now. Advised he trial off singulair for now to see if he notices a difference.

## 2016-03-07 NOTE — Progress Notes (Signed)
Pre visit review using our clinic review tool, if applicable. No additional management support is needed unless otherwise documented below in the visit note. 

## 2016-05-18 ENCOUNTER — Encounter: Payer: Self-pay | Admitting: Primary Care

## 2016-05-19 ENCOUNTER — Other Ambulatory Visit: Payer: Self-pay | Admitting: Primary Care

## 2016-05-19 DIAGNOSIS — R51 Headache: Principal | ICD-10-CM

## 2016-05-19 DIAGNOSIS — R519 Headache, unspecified: Secondary | ICD-10-CM

## 2016-05-19 MED ORDER — CYCLOBENZAPRINE HCL 10 MG PO TABS: ORAL_TABLET | ORAL | 1 refills | Status: DC

## 2016-05-28 ENCOUNTER — Other Ambulatory Visit: Payer: Self-pay | Admitting: Primary Care

## 2016-05-28 DIAGNOSIS — Z794 Long term (current) use of insulin: Secondary | ICD-10-CM

## 2016-05-28 DIAGNOSIS — E782 Mixed hyperlipidemia: Secondary | ICD-10-CM

## 2016-05-28 DIAGNOSIS — E119 Type 2 diabetes mellitus without complications: Secondary | ICD-10-CM

## 2016-05-30 ENCOUNTER — Ambulatory Visit: Admitting: Primary Care

## 2016-05-30 ENCOUNTER — Other Ambulatory Visit (INDEPENDENT_AMBULATORY_CARE_PROVIDER_SITE_OTHER)

## 2016-05-30 DIAGNOSIS — E782 Mixed hyperlipidemia: Secondary | ICD-10-CM | POA: Diagnosis not present

## 2016-05-30 DIAGNOSIS — Z794 Long term (current) use of insulin: Secondary | ICD-10-CM | POA: Diagnosis not present

## 2016-05-30 DIAGNOSIS — E119 Type 2 diabetes mellitus without complications: Secondary | ICD-10-CM | POA: Diagnosis not present

## 2016-05-30 LAB — LIPID PANEL
CHOL/HDL RATIO: 5
CHOLESTEROL: 168 mg/dL (ref 0–200)
HDL: 32.1 mg/dL — ABNORMAL LOW (ref >39.00)

## 2016-05-30 LAB — LDL CHOLESTEROL, DIRECT: LDL DIRECT: 86 mg/dL

## 2016-05-30 LAB — HEMOGLOBIN A1C: Hgb A1c MFr Bld: 7 % — ABNORMAL HIGH (ref 4.6–6.5)

## 2016-06-07 ENCOUNTER — Ambulatory Visit: Admitting: Primary Care

## 2016-06-10 ENCOUNTER — Ambulatory Visit (INDEPENDENT_AMBULATORY_CARE_PROVIDER_SITE_OTHER): Admitting: Primary Care

## 2016-06-10 ENCOUNTER — Encounter: Payer: Self-pay | Admitting: Primary Care

## 2016-06-10 VITALS — BP 136/86 | HR 90 | Temp 98.2°F | Ht 69.0 in | Wt 261.4 lb

## 2016-06-10 DIAGNOSIS — E782 Mixed hyperlipidemia: Secondary | ICD-10-CM

## 2016-06-10 DIAGNOSIS — E781 Pure hyperglyceridemia: Secondary | ICD-10-CM

## 2016-06-10 DIAGNOSIS — Z794 Long term (current) use of insulin: Secondary | ICD-10-CM

## 2016-06-10 DIAGNOSIS — E119 Type 2 diabetes mellitus without complications: Secondary | ICD-10-CM

## 2016-06-10 DIAGNOSIS — Z23 Encounter for immunization: Secondary | ICD-10-CM | POA: Diagnosis not present

## 2016-06-10 NOTE — Progress Notes (Signed)
Pre visit review using our clinic review tool, if applicable. No additional management support is needed unless otherwise documented below in the visit note. 

## 2016-06-10 NOTE — Addendum Note (Signed)
Addended by: Jacqualin Combes on: 06/10/2016 11:16 AM   Modules accepted: Orders

## 2016-06-10 NOTE — Patient Instructions (Addendum)
Stop Cholestyramine. Continue Fenofibrate 150 mg. Start Fish Oil 1000 mg three times daily with meals.  You MUST stop eating fast food, fried foods, fatty foods.  Start exercising. You should be getting 150 minutes of moderate intensity exercise weekly.  Schedule a lab only appointment in 3 months for recheck of your cholesterol.  It was a pleasure to see you today!

## 2016-06-10 NOTE — Assessment & Plan Note (Signed)
Recent A1C of 7.0.  Continue Levemir 20 units in the morning and 45 units in the evening, Janumet and Metformin. Pneumovax 23 provided today.

## 2016-06-10 NOTE — Assessment & Plan Note (Addendum)
Trigs increased from 342 to 568 since initiation of cholestyramine.  LDL and TC stable. Stop Cholestyramine. Continue Fenofirbate. Start Fish Oil 1000 mg three times daily. Strongly encouraged changes in diet and he continues to eat fast food 2 times daily. Strongly encouraged he start exercising. Will have him start Aspirin 81 mg once daily. Repeat lipids in 3 months. May need referral to cards.

## 2016-06-10 NOTE — Progress Notes (Signed)
 Subjective:    Patient ID: Devon Jackson, male    DOB: 12/26/1965, 50 y.o.   MRN: [id number]  HPI  Devon Jackson is a 50 year old male who presents today for follow up.   1) Hypertriglyceridemia: Recent triglyceride level of 563. He is cannot tolerate statin medications. LDL of 86, TC of 168 now. He is currently managed on fenofibrate. He was prescribed cholestyramine last visit. He's failed treatment on Lovaza and Gemfibrozil in the past. He endorses a poor diet and is not currently exercise. He denies chest pain, lower extremity edema, visual changes.  2) Type 2 Diabetes: Currently managed on Levemir 20 units in the morning and 45 units in the evening, Janumet XR 100-1000 mg once daily, and Metformin 1000 mg once daily. His recent A1C was 7.0 which is an improvement from 8.0 three months ago.  His diet currently includes: Breakfast: Fast food, grits, egg sandwich, skips Lunch: Fast food Dinner: Beans, country ham, tacos, pasta, soup Snacks: Chips Desserts: Occasionally Beverages: Diet pepsi, no water  Exercise: He does not currently exercise.  Review of Systems  Eyes: Negative for visual disturbance.  Respiratory: Negative for shortness of breath.   Cardiovascular: Negative for chest pain and leg swelling.  Neurological: Negative for numbness.       Past Medical History:  Diagnosis Date  . Allergic rhinitis   . Frequent headaches   . Gout   . Hypertriglyceridemia   . Type 2 diabetes mellitus (HCC)      Social History   Social History  . Marital status: Married    Spouse name: N/A  . Number of children: N/A  . Years of education: N/A   Occupational History  . Not on file.   Social History Main Topics  . Smoking status: Never Smoker  . Smokeless tobacco: Not on file  . Alcohol use 0.0 oz/week     Comment: rarely  . Drug use: No  . Sexual activity: Not on file   Other Topics Concern  . Not on file   Social History Narrative   Married.   3 children.   Works  as a service manager.   Enjoys relaxing, spending time outdoors.    Past Surgical History:  Procedure Laterality Date  . APPENDECTOMY    . POLYPECTOMY    . ROTATOR CUFF REPAIR  2009  . SEPTOPLASTY      Family History  Problem Relation Age of Onset  . Alcohol abuse Mother   . Diabetes Father   . Breast cancer Paternal Grandmother   . Lung cancer Paternal Grandfather     Allergies  Allergen Reactions  . Atorvastatin     Other reaction(s): Myalgias (intolerance)    Current Outpatient Prescriptions on File Prior to Visit  Medication Sig Dispense Refill  . albuterol (PROAIR HFA) 108 (90 Base) MCG/ACT inhaler Inhale 2 puffs into the lungs every 6 (six) hours as needed. 24 g 4  . BD PEN NEEDLE NANO U/F 32G X 4 MM MISC Use to check blood sugar 300 each 4  . blood glucose meter kit and supplies KIT Dispense based on patient and insurance preference. Use up to 3 times daily as directed. (Dx is E11.9). 1 each 0  . cholestyramine (QUESTRAN) 4 g packet Take 1 packet (4 g total) by mouth 2 (two) times daily. 180 each 1  . CIALIS 20 MG tablet Take 20 mg by mouth daily as needed.     . clonazePAM (KLONOPIN) 1 MG tablet   Take 1 tablet (1 mg total) by mouth as needed (severe headache). 30 tablet 0  . clotrimazole-betamethasone (LOTRISONE) cream Apply topically.    . colchicine 0.6 MG tablet Take 0.6 mg by mouth as needed.     . cyclobenzaprine (FLEXERIL) 10 MG tablet Take 1 tablet by mouth once to twice daily as needed for severe headaches. 60 tablet 1  . DEXILANT 60 MG capsule Take 1 capsule (60 mg total) by mouth as needed. 90 capsule 3  . Fenofibrate 150 MG CAPS Take 1 capsule (150 mg total) by mouth at bedtime. 90 each 1  . glucose blood (FREESTYLE INSULINX TEST) test strip Test blood sugar 2-3 times daily and as directed. 100 each 5  . insulin detemir (LEVEMIR) 100 UNIT/ML injection Inject 15 units in the morning and 45 units at bedtime. 30 mL 3  . Insulin Detemir (LEVEMIR) 100 UNIT/ML Pen  Inject 20 units in the morning and 45 units in the evening. 45 mL 1  . JANUMET XR 100-1000 MG TB24 Take 1 tablet by mouth at bedtime. 90 tablet 3  . Lancets (FREESTYLE) lancets Test blood sugar 2-3 times daily and as directed. Dx E11.9 300 each 1  . meloxicam (MOBIC) 15 MG tablet Take 15 mg by mouth daily.    . metFORMIN (GLUCOPHAGE) 1000 MG tablet Take 1 tablet (1,000 mg total) by mouth at bedtime. 90 tablet 3  . montelukast (SINGULAIR) 10 MG tablet Take 1 tablet (10 mg total) by mouth at bedtime. 90 tablet 3   No current facility-administered medications on file prior to visit.     BP 136/86   Pulse 90   Temp 98.2 F (36.8 C) (Oral)   Ht 5' 9" (1.753 m)   Wt 261 lb 6.4 oz (118.6 kg)   SpO2 95%   BMI 38.60 kg/m    Objective:   Physical Exam  Constitutional: He appears well-nourished.  Neck: Neck supple.  Cardiovascular: Normal rate and regular rhythm.   Pulmonary/Chest: Breath sounds normal. No respiratory distress.  Skin: Skin is warm and dry.          Assessment & Plan:   

## 2016-07-24 ENCOUNTER — Encounter: Payer: Self-pay | Admitting: Primary Care

## 2016-07-26 ENCOUNTER — Other Ambulatory Visit: Payer: Self-pay | Admitting: Primary Care

## 2016-07-27 ENCOUNTER — Other Ambulatory Visit: Payer: Self-pay | Admitting: Primary Care

## 2016-07-27 DIAGNOSIS — N529 Male erectile dysfunction, unspecified: Secondary | ICD-10-CM

## 2016-07-27 MED ORDER — CIALIS 20 MG PO TABS: 20.0000 mg | ORAL_TABLET | Freq: Every day | ORAL | 1 refills | Status: DC | PRN

## 2016-08-01 ENCOUNTER — Other Ambulatory Visit: Payer: Self-pay | Admitting: Primary Care

## 2016-08-01 DIAGNOSIS — Z794 Long term (current) use of insulin: Principal | ICD-10-CM

## 2016-08-01 DIAGNOSIS — E119 Type 2 diabetes mellitus without complications: Secondary | ICD-10-CM

## 2016-08-03 MED ORDER — INSULIN DETEMIR 100 UNIT/ML FLEXPEN: PEN_INJECTOR | SUBCUTANEOUS | 1 refills | Status: DC

## 2016-08-03 NOTE — Addendum Note (Signed)
Addended by: Jacqualin Combes on: 08/03/2016 11:59 AM   Modules accepted: Orders

## 2016-08-07 ENCOUNTER — Encounter: Payer: Self-pay | Admitting: Primary Care

## 2016-08-08 ENCOUNTER — Other Ambulatory Visit: Payer: Self-pay | Admitting: Primary Care

## 2016-08-08 NOTE — Telephone Encounter (Signed)
Devon Jackson, please see My Chart message. Do you know anything about his Levemir or CPAP supplies? I told him to check with his insurance regarding the Levemir.

## 2016-08-26 ENCOUNTER — Other Ambulatory Visit: Payer: Self-pay | Admitting: Primary Care

## 2016-08-26 DIAGNOSIS — E785 Hyperlipidemia, unspecified: Secondary | ICD-10-CM

## 2016-09-14 ENCOUNTER — Other Ambulatory Visit (INDEPENDENT_AMBULATORY_CARE_PROVIDER_SITE_OTHER)

## 2016-09-14 DIAGNOSIS — E781 Pure hyperglyceridemia: Secondary | ICD-10-CM | POA: Diagnosis not present

## 2016-09-14 LAB — LIPID PANEL
Cholesterol: 149 mg/dL (ref 0–200)
HDL: 26.2 mg/dL — ABNORMAL LOW (ref >39.00)
NONHDL: 122.96
TRIGLYCERIDES: 330 mg/dL — AB (ref 0.0–149.0)
Total CHOL/HDL Ratio: 6
VLDL: 66 mg/dL — ABNORMAL HIGH (ref 0.0–40.0)

## 2016-09-14 LAB — LDL CHOLESTEROL, DIRECT: LDL DIRECT: 87 mg/dL

## 2016-10-31 ENCOUNTER — Other Ambulatory Visit: Payer: Self-pay | Admitting: Primary Care

## 2016-11-24 ENCOUNTER — Other Ambulatory Visit: Payer: Self-pay | Admitting: Primary Care

## 2016-11-24 DIAGNOSIS — J309 Allergic rhinitis, unspecified: Secondary | ICD-10-CM

## 2016-12-12 ENCOUNTER — Ambulatory Visit: Admitting: Primary Care

## 2016-12-12 ENCOUNTER — Other Ambulatory Visit: Payer: Self-pay | Admitting: Primary Care

## 2016-12-12 DIAGNOSIS — N529 Male erectile dysfunction, unspecified: Secondary | ICD-10-CM

## 2016-12-13 ENCOUNTER — Other Ambulatory Visit: Payer: Self-pay | Admitting: Primary Care

## 2016-12-13 ENCOUNTER — Ambulatory Visit (INDEPENDENT_AMBULATORY_CARE_PROVIDER_SITE_OTHER): Admitting: Primary Care

## 2016-12-13 VITALS — BP 122/82 | HR 62 | Temp 97.8°F | Ht 69.0 in | Wt 222.0 lb

## 2016-12-13 DIAGNOSIS — R51 Headache: Secondary | ICD-10-CM | POA: Diagnosis not present

## 2016-12-13 DIAGNOSIS — Z794 Long term (current) use of insulin: Secondary | ICD-10-CM | POA: Diagnosis not present

## 2016-12-13 DIAGNOSIS — E785 Hyperlipidemia, unspecified: Secondary | ICD-10-CM | POA: Diagnosis not present

## 2016-12-13 DIAGNOSIS — E119 Type 2 diabetes mellitus without complications: Secondary | ICD-10-CM

## 2016-12-13 DIAGNOSIS — M6530 Trigger finger, unspecified finger: Secondary | ICD-10-CM | POA: Insufficient documentation

## 2016-12-13 DIAGNOSIS — R519 Headache, unspecified: Secondary | ICD-10-CM

## 2016-12-13 DIAGNOSIS — Z23 Encounter for immunization: Secondary | ICD-10-CM

## 2016-12-13 DIAGNOSIS — M65331 Trigger finger, right middle finger: Secondary | ICD-10-CM

## 2016-12-13 LAB — COMPREHENSIVE METABOLIC PANEL
ALBUMIN: 4.5 g/dL (ref 3.5–5.2)
ALK PHOS: 37 U/L — AB (ref 39–117)
ALT: 21 U/L (ref 0–53)
AST: 16 U/L (ref 0–37)
BILIRUBIN TOTAL: 0.6 mg/dL (ref 0.2–1.2)
BUN: 20 mg/dL (ref 6–23)
CALCIUM: 9.9 mg/dL (ref 8.4–10.5)
CO2: 30 mEq/L (ref 19–32)
Chloride: 106 mEq/L (ref 96–112)
Creatinine, Ser: 0.99 mg/dL (ref 0.40–1.50)
GFR: 84.39 mL/min (ref >60.00)
GLUCOSE: 123 mg/dL — AB (ref 70–99)
Potassium: 4.4 mEq/L (ref 3.5–5.1)
Sodium: 141 mEq/L (ref 135–145)
TOTAL PROTEIN: 6.9 g/dL (ref 6.0–8.3)

## 2016-12-13 LAB — LIPID PANEL
CHOLESTEROL: 134 mg/dL (ref 0–200)
HDL: 39.1 mg/dL (ref >39.00)
LDL Cholesterol: 80 mg/dL (ref 0–99)
NONHDL: 95.1
Total CHOL/HDL Ratio: 3
Triglycerides: 75 mg/dL (ref 0.0–149.0)
VLDL: 15 mg/dL (ref 0.0–40.0)

## 2016-12-13 LAB — MICROALBUMIN / CREATININE URINE RATIO
CREATININE, U: 106.3 mg/dL
MICROALB/CREAT RATIO: 0.7 mg/g (ref 0.0–30.0)
Microalb, Ur: 0.7 mg/dL (ref 0.0–1.9)

## 2016-12-13 LAB — HEMOGLOBIN A1C: Hgb A1c MFr Bld: 6.3 % (ref 4.6–6.5)

## 2016-12-13 NOTE — Patient Instructions (Addendum)
Complete lab work prior to leaving today. I will notify you of your results once received.   For acid reflux try taking Pepcid or Zantac as needed. Please notify me if this medication doesn't help.   Congratulations on your weight loss, keep up the great work!   Follow up in 6 months for your physical.  It was a pleasure to see you today!

## 2016-12-13 NOTE — Addendum Note (Signed)
Addended by: Jacqualin Combes on: 12/13/2016 12:32 PM   Modules accepted: Orders

## 2016-12-13 NOTE — Assessment & Plan Note (Signed)
Weight loss of 30 pounds since last visit. Check lipids today, continue fenofibrate and Fish Oil.  Cannot tolerate statins.

## 2016-12-13 NOTE — Assessment & Plan Note (Addendum)
Weight loss of 30 pounds since last visit, commended him on this success. Will consider eliminating Levemir if A1C is appropriate. Long discussion regarding importance of continuing healhty lifestyle changes. A1C and urine microalbumin pending.

## 2016-12-13 NOTE — Assessment & Plan Note (Signed)
Overall no significant improvement on Singulair. Will have him continue this with the breathe right strips if improving.

## 2016-12-13 NOTE — Progress Notes (Signed)
 Subjective:    Patient ID: Devon Jackson, male    DOB: 06/09/1966, 51 y.o.   MRN: [id number]  HPI  Mr. Devon Jackson is a 51 year old male who presents today for follow up.  1) Hyperlipidemia: Currently managed on Fenofibrate. He cannot tolerate statin medications and has failed numerous medications. TC of 149 and LDL of 87 in February 2018. He is also taking Fish Oil 1200 mg x 3 every morning.   2) Type 2 Diabetes: Currently managed on Janumet 100/1000 mg, Metformin 1000 mg daily, Levemir 20 units in the morning and 45 units evening. His last A1c in November 2017 was 7.0. He is due for recheck today. Over the past 2 months he's been taking Levemir 10 units in the morning and has not been taking his Metformin 1000 mg. He's lost 30 pounds since his last visit since staring the Herbal Life diet in March 2018.  He's checking his sugars once-twice daily. His fasting AM sugars are running high 90's-low 110's; his evening sugars are running in the 80's-90's.   Wt Readings from Last 3 Encounters:  12/13/16 222 lb (100.7 kg)  06/10/16 261 lb 6.4 oz (118.6 kg)  03/07/16 260 lb 6.4 oz (118.1 kg)     3) Allergic Rhinitis: Currently managed on Singulair 10 mg. He's also using breathe right strips. Overall he's not noticed much improvement with Singulair, but the breathe right strips help.  4) GERD: Currently managed on Dexilant 60 mg for which he was initiated on by prior PCP.  He last took his Dexilant tablet 1-2 months ago. He uses this medication PRN. He does not use Tums or H2 Blockers.  5) Frequent Headaches: Currently managed on Meloxicam 15 mg, Flexeril 10 mg PRN. Using Clonazepam for severe headaches which occur infrequently. His headaches have significantly reduced since switching jobs in early March 2018.   6) Trigger Finger: Located to right third digit that is chronic for years. Once completed an injection which was ineffective, this was years ago. 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  Review of Systems  Constitutional: Negative for fatigue.  Eyes: Negative for visual disturbance.  Respiratory: Negative for shortness of breath.   Cardiovascular: Negative for chest pain.  Musculoskeletal:       Trigger finger  Neurological: Negative for dizziness, numbness and headaches.       Past Medical History:  Diagnosis Date  . Allergic rhinitis   . Frequent headaches   . Gout   . Hypertriglyceridemia   . Type 2 diabetes mellitus (HCC)      Social History   Social History  . Marital status: Married    Spouse name: N/A  . Number of children: N/A  . Years of education: N/A   Occupational History  . Not on file.   Social History Main Topics  . Smoking status: Never Smoker  . Smokeless tobacco: Not on file  . Alcohol use 0.0 oz/week     Comment: rarely  . Drug use: No  . Sexual activity: Not on file   Other Topics Concern  . Not on file   Social History Narrative   Married.   3 children.   Works as a service manager.   Enjoys relaxing, spending time outdoors.    Past Surgical History:  Procedure Laterality Date  . APPENDECTOMY    . POLYPECTOMY    . ROTATOR CUFF REPAIR  2009  . SEPTOPLASTY        Family History  Problem Relation Age of Onset  . Alcohol abuse Mother   . Diabetes Father   . Breast cancer Paternal Grandmother   . Lung cancer Paternal Grandfather     Allergies  Allergen Reactions  . Atorvastatin     Other reaction(s): Myalgias (intolerance)    Current Outpatient Prescriptions on File Prior to Visit  Medication Sig Dispense Refill  . albuterol (PROAIR HFA) 108 (90 Base) MCG/ACT inhaler Inhale 2 puffs into the lungs every 6 (six) hours as needed. 24 g 4  . BD PEN NEEDLE NANO U/F 32G X 4 MM MISC USE TO CHECK BLOOD SUGAR AS DIRECTED 300 each 4  . blood glucose meter kit and supplies KIT Dispense based on patient and insurance preference. Use up to 3 times daily as directed. (Dx is E11.9). 1 each 0  .  CIALIS 20 MG tablet TAKE 1 TABLET DAILY AS NEEDED FOR ERECTILE DYSFUNCTION 21 tablet 1  . clonazePAM (KLONOPIN) 1 MG tablet Take 1 tablet (1 mg total) by mouth as needed (severe headache). 30 tablet 0  . clotrimazole-betamethasone (LOTRISONE) cream Apply topically.    . colchicine 0.6 MG tablet Take 0.6 mg by mouth as needed.     . cyclobenzaprine (FLEXERIL) 10 MG tablet Take 1 tablet by mouth once to twice daily as needed for severe headaches. 60 tablet 1  . DEXILANT 60 MG capsule Take 1 capsule (60 mg total) by mouth as needed. 90 capsule 3  . FREESTYLE LITE test strip USE UP TO THREE TIMES A DAY AS DIRECTED 300 each 1  . Insulin Detemir (LEVEMIR) 100 UNIT/ML Pen Inject 20 units in the morning and 45 units in the evening. (Patient taking differently: Inject 10 units in the evening.) 15 pen 1  . JANUMET XR [phone] MG TB24 Take 1 tablet by mouth at bedtime. 90 tablet 3  . Lancets (FREESTYLE) lancets Test blood sugar 2-3 times daily and as directed. Dx E11.9 300 each 1  . LEVEMIR FLEXTOUCH 100 UNIT/ML Pen INJECT 15 UNITS IN THE MORNING AND 45 UNITS IN THE EVENING 45 mL 1  . LIPOFEN 150 MG CAPS TAKE 1 CAPSULE AT BEDTIME 90 capsule 1  . montelukast (SINGULAIR) 10 MG tablet TAKE 1 TABLET AT BEDTIME 90 tablet 3  . metFORMIN (GLUCOPHAGE) 1000 MG tablet TAKE 1 TABLET AT BEDTIME. (Patient not taking: Reported on 12/13/2016) 90 tablet 1   No current facility-administered medications on file prior to visit.     BP 122/82   Pulse 62   Temp 97.8 F (36.6 C) (Oral)   Ht 5' 9" (1.753 m)   Wt 222 lb (100.7 kg)   SpO2 97%   BMI 32.78 kg/m    Objective:   Physical Exam  Constitutional: He appears well-nourished.  Neck: Neck supple.  Cardiovascular: Normal rate and regular rhythm.   Pulmonary/Chest: Effort normal and breath sounds normal.  Skin: Skin is warm and dry.          Assessment & Plan:

## 2016-12-13 NOTE — Assessment & Plan Note (Signed)
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 He has cut himself several times since starting his new occupation and is unsure of his last tetanus. Td provided today. Continue to monitor.

## 2016-12-13 NOTE — Assessment & Plan Note (Signed)
Third digit of right hand. Chronic for years. Will have him start with Sports Medicine, may need to refer later.

## 2016-12-23 LAB — HM DIABETES EYE EXAM

## 2016-12-29 ENCOUNTER — Other Ambulatory Visit: Payer: Self-pay | Admitting: Primary Care

## 2017-01-04 ENCOUNTER — Encounter: Payer: Self-pay | Admitting: Internal Medicine

## 2017-02-22 ENCOUNTER — Other Ambulatory Visit: Payer: Self-pay | Admitting: Primary Care

## 2017-02-22 DIAGNOSIS — E785 Hyperlipidemia, unspecified: Secondary | ICD-10-CM

## 2017-03-04 ENCOUNTER — Other Ambulatory Visit: Payer: Self-pay | Admitting: Primary Care

## 2017-03-04 DIAGNOSIS — K219 Gastro-esophageal reflux disease without esophagitis: Secondary | ICD-10-CM

## 2017-03-08 ENCOUNTER — Ambulatory Visit (INDEPENDENT_AMBULATORY_CARE_PROVIDER_SITE_OTHER): Admitting: Family Medicine

## 2017-03-08 ENCOUNTER — Encounter: Payer: Self-pay | Admitting: Family Medicine

## 2017-03-08 VITALS — BP 114/68 | HR 84 | Temp 97.8°F | Ht 69.0 in | Wt 219.8 lb

## 2017-03-08 DIAGNOSIS — M65331 Trigger finger, right middle finger: Secondary | ICD-10-CM | POA: Diagnosis not present

## 2017-03-08 NOTE — Progress Notes (Signed)
Dr. Frederico Hamman T. Lex Linhares, MD, [facility] [place], [address] Phone: [phone] Fax: [phone]  03/08/2017  Patient: Devon Jackson, MRN: [id number], DOB: [birth year], 51 y.o.  Primary Physician:  Pleas Koch, NP   Chief Complaint  Patient presents with  . Acute Visit    Pt here today c/o stiffiness in his trigger finger on his right hand, Pt states finger does pop and has some pain   Subjective:   Devon Jackson is a 51 y.o. very pleasant male patient who presents with the following:  R middle finger: per report ongoing x "30 years." Triggering worse and pain x 2-3 years.   H/o injection x 1 distantly  Past Medical History, Surgical History, Social History, Family History, Problem List, Medications, and Allergies have been reviewed and updated if relevant.  Patient Active Problem List   Diagnosis Date Noted  . Trigger finger 12/13/2016  . Preventative health care 11/26/2015  . Type 2 diabetes mellitus ([place]) 09/23/2015  . Frequent headaches 09/23/2015  . Hyperlipidemia 09/23/2015  . Allergic rhinitis 09/23/2015    Past Medical History:  Diagnosis Date  . Allergic rhinitis   . Frequent headaches   . Gout   . Hypertriglyceridemia   . Type 2 diabetes mellitus ([place])     Past Surgical History:  Procedure Laterality Date  . APPENDECTOMY    . POLYPECTOMY    . ROTATOR CUFF REPAIR  2009  . SEPTOPLASTY      Social History   Social History  . Marital status: Married    Spouse name: N/A  . Number of children: N/A  . Years of education: N/A   Occupational History  . Not on file.   Social History Main Topics  . Smoking status: Never Smoker  . Smokeless tobacco: Never Used  . Alcohol use 0.0 oz/week     Comment: rarely  . Drug use: No  . Sexual activity: Not on file   Other Topics Concern  . Not on file   Social History Narrative   Married.   3 children.   Works as a Therapist, nutritional.   Enjoys relaxing, spending time outdoors.    Family History  Problem Relation Age of Onset  . Alcohol abuse Mother   . Diabetes Father   . Breast cancer Paternal Grandmother   . Lung cancer Paternal Grandfather     Allergies  Allergen Reactions  . Atorvastatin     Other reaction(s): Myalgias (intolerance)    Medication list reviewed and updated in full in [place].  GEN: No fevers, chills. Nontoxic. Primarily MSK c/o today. MSK: Detailed in the HPI GI: tolerating PO intake without difficulty Neuro: No numbness, parasthesias, or tingling associated. Otherwise the pertinent positives of the ROS are noted above.   Objective:   BP 114/68 (BP Location: Left Arm, Patient Position: Sitting, Cuff Size: Normal)   Pulse 84   Temp 97.8 F (36.6 C) (Oral)   Ht 5' 9"  (1.753 m)   Wt 219 lb 12.8 oz (99.7 kg)   SpO2 96%   BMI 32.46 kg/m    GEN: WDWN, NAD, Non-toxic, Alert & Oriented x 3 HEENT: Atraumatic, Normocephalic.  Ears and Nose: No external deformity. EXTR: No clubbing/cyanosis/edema NEURO: Normal gait.  PSYCH: Normally interactive. Conversant. Not depressed or anxious appearing.  Calm demeanor.   R hand Ecchymosis or edema: neg ROM wrist/hand/digits: full  Carpals, MCP's, digits: NT Distal Ulna and Radius: NT  Ecchymosis or edema: neg No instability Cysts/nodules: 3rd Digit triggering: 3rd Finkelstein's test: neg Snuffbox tenderness: neg Scaphoid tubercle: NT Resisted supination: NT Full composite fist, no malrotation Grip, all digits: 5/5 str DIPJT: NT PIP JT: NT MCP JT: NT No tenosynovitis Axial load test: neg Phalen's: neg Tinel's: neg Atrophy: neg  Hand sensation: intact   Radiology: No results found.  Assessment and Plan:   Acquired trigger finger of right middle finger - Plan: Ambulatory referral to Hand Surgery  Declines trigger finger injection.   He would like to see hand surgery for definitive release.   Follow-up: No Follow-up  on file.  Future Appointments Date Time Provider [place]  06/12/2017 7:30 AM [facility]  06/23/2017 7:30 AM Pleas Koch, NP [facility]   Orders Placed This Encounter  Procedures  . Ambulatory referral to Hand Surgery    Signed,  Frederico Hamman T. Chastin Riesgo, MD   Allergies as of 03/08/2017      Reactions   Atorvastatin    Other reaction(s): Myalgias (intolerance)      Medication List       Accurate as of 03/08/17 11:07 AM. Always use your most recent med list.          albuterol 108 (90 Base) MCG/ACT inhaler Commonly known as:  PROAIR HFA Inhale 2 puffs into the lungs every 6 (six) hours as needed.   BD PEN NEEDLE NANO U/F 32G X 4 MM Misc Generic drug:  Insulin Pen Needle USE TO CHECK BLOOD SUGAR AS DIRECTED   blood glucose meter kit and supplies Kit Dispense based on patient and insurance preference. Use up to 3 times daily as directed. (Dx is E11.9).   CIALIS 20 MG tablet Generic drug:  tadalafil TAKE 1 TABLET DAILY AS NEEDED FOR ERECTILE DYSFUNCTION   clonazePAM 1 MG tablet Commonly known as:  KLONOPIN Take 1 tablet (1 mg total) by mouth as needed (severe headache).   clotrimazole-betamethasone cream Commonly known as:  LOTRISONE Apply topically.   colchicine 0.6 MG tablet Take 0.6 mg by mouth as needed.   cyclobenzaprine 10 MG tablet Commonly known as:  FLEXERIL Take 1 tablet by mouth once to twice daily as needed for severe headaches.   DEXILANT 60 MG capsule Generic drug:  dexlansoprazole TAKE 1 CAPSULE AS NEEDED   freestyle lancets USE TO TEST BLOOD SUGAR 2 TO 3 TIMES DAILY AND AS DIRECTED   FREESTYLE LITE test strip Generic drug:  glucose blood USE UP TO THREE TIMES A DAY AS DIRECTED   JANUMET XR [phone] MG Tb24 Generic drug:  SitaGLIPtin-MetFORMIN HCl TAKE 1 TABLET AT BEDTIME   LIPOFEN 150 MG Caps Generic drug:  Fenofibrate TAKE 1 CAPSULE AT BEDTIME   montelukast 10 MG tablet Commonly  known as:  SINGULAIR TAKE 1 TABLET AT BEDTIME

## 2017-03-08 NOTE — Patient Instructions (Signed)

## 2017-04-29 ENCOUNTER — Other Ambulatory Visit: Payer: Self-pay | Admitting: Primary Care

## 2017-05-23 ENCOUNTER — Other Ambulatory Visit: Payer: Self-pay | Admitting: Primary Care

## 2017-05-23 DIAGNOSIS — N529 Male erectile dysfunction, unspecified: Secondary | ICD-10-CM

## 2017-06-02 ENCOUNTER — Other Ambulatory Visit: Payer: Self-pay | Admitting: Primary Care

## 2017-06-04 ENCOUNTER — Other Ambulatory Visit: Payer: Self-pay | Admitting: Primary Care

## 2017-06-04 DIAGNOSIS — E119 Type 2 diabetes mellitus without complications: Secondary | ICD-10-CM

## 2017-06-04 DIAGNOSIS — Z794 Long term (current) use of insulin: Principal | ICD-10-CM

## 2017-06-04 DIAGNOSIS — E782 Mixed hyperlipidemia: Secondary | ICD-10-CM

## 2017-06-12 ENCOUNTER — Other Ambulatory Visit: Payer: Self-pay | Admitting: Primary Care

## 2017-06-12 ENCOUNTER — Other Ambulatory Visit (INDEPENDENT_AMBULATORY_CARE_PROVIDER_SITE_OTHER)

## 2017-06-12 DIAGNOSIS — Z794 Long term (current) use of insulin: Secondary | ICD-10-CM | POA: Diagnosis not present

## 2017-06-12 DIAGNOSIS — E782 Mixed hyperlipidemia: Secondary | ICD-10-CM

## 2017-06-12 DIAGNOSIS — E119 Type 2 diabetes mellitus without complications: Secondary | ICD-10-CM

## 2017-06-12 LAB — BASIC METABOLIC PANEL
BUN: 20 mg/dL (ref 6–23)
CALCIUM: 9.4 mg/dL (ref 8.4–10.5)
CO2: 30 mEq/L (ref 19–32)
CREATININE: 1.01 mg/dL (ref 0.40–1.50)
Chloride: 104 mEq/L (ref 96–112)
GFR: 82.31 mL/min (ref >60.00)
Glucose, Bld: 137 mg/dL — ABNORMAL HIGH (ref 70–99)
Potassium: 4.5 mEq/L (ref 3.5–5.1)
Sodium: 139 mEq/L (ref 135–145)

## 2017-06-12 LAB — LIPID PANEL
CHOL/HDL RATIO: 5
Cholesterol: 156 mg/dL (ref 0–200)
HDL: 34.4 mg/dL — AB (ref >39.00)
LDL Cholesterol: 97 mg/dL (ref 0–99)
NONHDL: 121.33
TRIGLYCERIDES: 121 mg/dL (ref 0.0–149.0)
VLDL: 24.2 mg/dL (ref 0.0–40.0)

## 2017-06-12 LAB — HEMOGLOBIN A1C: HEMOGLOBIN A1C: 6.3 % (ref 4.6–6.5)

## 2017-06-19 ENCOUNTER — Encounter: Admitting: Primary Care

## 2017-06-23 ENCOUNTER — Encounter: Payer: Self-pay | Admitting: Primary Care

## 2017-06-23 ENCOUNTER — Ambulatory Visit (INDEPENDENT_AMBULATORY_CARE_PROVIDER_SITE_OTHER): Admitting: Primary Care

## 2017-06-23 VITALS — BP 110/70 | HR 84 | Temp 97.8°F | Ht 69.0 in | Wt 224.8 lb

## 2017-06-23 DIAGNOSIS — M65331 Trigger finger, right middle finger: Secondary | ICD-10-CM | POA: Diagnosis not present

## 2017-06-23 DIAGNOSIS — I499 Cardiac arrhythmia, unspecified: Secondary | ICD-10-CM

## 2017-06-23 DIAGNOSIS — Z794 Long term (current) use of insulin: Secondary | ICD-10-CM | POA: Diagnosis not present

## 2017-06-23 DIAGNOSIS — R51 Headache: Secondary | ICD-10-CM | POA: Diagnosis not present

## 2017-06-23 DIAGNOSIS — Z0000 Encounter for general adult medical examination without abnormal findings: Secondary | ICD-10-CM

## 2017-06-23 DIAGNOSIS — Z23 Encounter for immunization: Secondary | ICD-10-CM | POA: Diagnosis not present

## 2017-06-23 DIAGNOSIS — E782 Mixed hyperlipidemia: Secondary | ICD-10-CM

## 2017-06-23 DIAGNOSIS — J309 Allergic rhinitis, unspecified: Secondary | ICD-10-CM

## 2017-06-23 DIAGNOSIS — K219 Gastro-esophageal reflux disease without esophagitis: Secondary | ICD-10-CM | POA: Diagnosis not present

## 2017-06-23 DIAGNOSIS — R519 Headache, unspecified: Secondary | ICD-10-CM

## 2017-06-23 DIAGNOSIS — E119 Type 2 diabetes mellitus without complications: Secondary | ICD-10-CM

## 2017-06-23 DIAGNOSIS — G4733 Obstructive sleep apnea (adult) (pediatric): Secondary | ICD-10-CM | POA: Diagnosis not present

## 2017-06-23 NOTE — Patient Instructions (Addendum)
You will be contacted regarding your referral to pulmonology.  Please let us know if you have not heard back within one week.   Start exercising. You should be getting 150 minutes of moderate intensity exercise weekly.  Increase vegetables, fruit, whole grains.  Ensure you are consuming 64 ounces of water daily.  Schedule a follow up visit in 6 months.  It was a pleasure to see you today!

## 2017-06-23 NOTE — Assessment & Plan Note (Signed)
Improved overall. Not using clonazepam much.

## 2017-06-23 NOTE — Assessment & Plan Note (Addendum)
Recent A1C of 6.3. Remains off of insulin. Continues to count calories and watch portion sizes, commended him on this. Eye exam UTD. Pneumonia vaccination UTD. Urine microalbumin UTD, negative. Lipids stable. Follow up in 6 months.

## 2017-06-23 NOTE — Addendum Note (Signed)
Addended by: Jacqualin Combes on: 06/23/2017 12:29 PM   Modules accepted: Orders

## 2017-06-23 NOTE — Assessment & Plan Note (Signed)
Has not used CPAP in months given decreased ability to breathe. No recent sleep study. Has lost significant amount of weight through lifestyle changes. Referral placed to pulmonology for repeat study and evaluation.

## 2017-06-23 NOTE — Assessment & Plan Note (Signed)
Compliant to Singulair, using breathe right strips as CPAP causes difficulty breathing.

## 2017-06-23 NOTE — Assessment & Plan Note (Signed)
Td and pneumonia UTD, influenza vaccination UTD. PSA UTD. Commended him on maintaining his weight loss, recommended to continue. Exam unremarkable. Labs stable. Follow up in 1 year for CPE.

## 2017-06-23 NOTE — Assessment & Plan Note (Signed)
No use of Dexilant since weight loss. Continue to monitor.

## 2017-06-23 NOTE — Assessment & Plan Note (Signed)
Completed cortisone injections x 2. Will be undergoing procedure Monday next week.

## 2017-06-23 NOTE — Assessment & Plan Note (Signed)
LDL at goal. Commended him on weight loss, encouraged regular exercise and continued improvement on weight.

## 2017-06-23 NOTE — Assessment & Plan Note (Addendum)
Noted on exam, patient endorses long history of this. No documentation of this in chart.  ECG obtained today: NSR rate of 75. PVC's noted on ECG, bigeminy. No ST elevation.

## 2017-06-23 NOTE — Progress Notes (Signed)
Subjective:    Patient ID: Devon Jackson, male    DOB: 12-05-1965, 51 y.o.   MRN: [id number]  HPI  Devon Jackson is a 51 year old male who presents today for complete physical.  Immunizations: -Tetanus: Completed in 2018 -Influenza: Due -Pneumonia: Completed in 2017   Diet: He is semi-doing Herbal Life, he is calorie counting.  Breakfast: skips, sometimes protein shake Lunch: Fast food, trying to make healthier choices Dinner: Devon Jackson meat, baked meat, beans, some vegetables Snacks: Occasionally beef jerky, chips, peanut butter Desserts: 2-3 times weekly Beverages: Diet soda, little water  Exercise: He does not currently exercise  Eye exam: Completed  Dental exam: UTD Colonoscopy: Completed in 2016, due in 2021 PSA: Completed in 2017   Review of Systems  Constitutional: Negative for unexpected weight change.  HENT: Negative for rhinorrhea.   Respiratory: Negative for cough and shortness of breath.   Cardiovascular: Negative for chest pain.  Gastrointestinal: Negative for constipation and diarrhea.  Genitourinary: Negative for difficulty urinating.  Musculoskeletal: Negative for arthralgias and myalgias.  Skin: Negative for rash.  Allergic/Immunologic: Negative for environmental allergies.  Neurological: Negative for dizziness, numbness and headaches.  Psychiatric/Behavioral:       Denies concerns for anxiety or depression       Past Medical History:  Diagnosis Date  . Allergic rhinitis   . Frequent headaches   . Gout   . Hypertriglyceridemia   . Type 2 diabetes mellitus ([place])      Social History   Socioeconomic History  . Marital status: Married    Spouse name: Not on file  . Number of children: Not on file  . Years of education: Not on file  . Highest education level: Not on file  Social Needs  . Financial resource strain: Not on file  . Food insecurity - worry: Not on file  . Food insecurity - inability: Not on file  . Transportation needs - medical:  Not on file  . Transportation needs - non-medical: Not on file  Occupational History  . Not on file  Tobacco Use  . Smoking status: Never Smoker  . Smokeless tobacco: Never Used  Substance and Sexual Activity  . Alcohol use: Yes    Alcohol/week: 0.0 oz    Comment: rarely  . Drug use: No  . Sexual activity: Not on file  Other Topics Concern  . Not on file  Social History Narrative   Married.   3 children.   Works as a Therapist, nutritional.   Enjoys relaxing, spending time outdoors.    Past Surgical History:  Procedure Laterality Date  . APPENDECTOMY    . POLYPECTOMY    . ROTATOR CUFF REPAIR  2009  . SEPTOPLASTY      Family History  Problem Relation Age of Onset  . Alcohol abuse Mother   . Diabetes Father   . Breast cancer Paternal Grandmother   . Lung cancer Paternal Grandfather     Allergies  Allergen Reactions  . Atorvastatin     Other reaction(s): Myalgias (intolerance)    Current Outpatient Medications on File Prior to Visit  Medication Sig Dispense Refill  . albuterol (PROAIR HFA) 108 (90 Base) MCG/ACT inhaler Inhale 2 puffs into the lungs every 6 (six) hours as needed. 24 g 4  . BD PEN NEEDLE NANO U/F 32G X 4 MM MISC USE TO CHECK BLOOD SUGAR AS DIRECTED 300 each 4  . blood glucose meter kit and supplies KIT Dispense based on patient and insurance  preference. Use up to 3 times daily as directed. (Dx is E11.9). 1 each 0  . CIALIS 20 MG tablet TAKE 1 TABLET DAILY AS NEEDED FOR ERECTILE DYSFUNCTION 21 tablet 1  . clonazePAM (KLONOPIN) 1 MG tablet Take 1 tablet (1 mg total) by mouth as needed (severe headache). 30 tablet 0  . clotrimazole-betamethasone (LOTRISONE) cream Apply topically.    . colchicine 0.6 MG tablet Take 0.6 mg by mouth as needed.     . cyclobenzaprine (FLEXERIL) 10 MG tablet Take 1 tablet by mouth once to twice daily as needed for severe headaches. 60 tablet 1  . DEXILANT 60 MG capsule TAKE 1 CAPSULE AS NEEDED 90 capsule 1  . Exenatide ER 2 MG PEN  Inject 2 mg into the skin.    Marland Kitchen FREESTYLE LITE test strip USE UP TO THREE TIMES A DAY AS DIRECTED 300 each 1  . JANUMET XR [phone] MG TB24 TAKE 1 TABLET AT BEDTIME 90 tablet 1  . Lancets (FREESTYLE) lancets USE TO TEST BLOOD SUGAR 2 TO 3 TIMES DAILY AND AS DIRECTED 300 each 1  . LIPOFEN 150 MG CAPS TAKE 1 CAPSULE AT BEDTIME 90 capsule 1  . metFORMIN (GLUCOPHAGE) 1000 MG tablet Take 1,000 mg by mouth daily with breakfast.     . montelukast (SINGULAIR) 10 MG tablet TAKE 1 TABLET AT BEDTIME 90 tablet 3   No current facility-administered medications on file prior to visit.     BP 110/70   Pulse 84   Temp 97.8 F (36.6 C) (Oral)   Ht 5' 9"  (1.753 m)   Wt 224 lb 12.8 oz (102 kg)   SpO2 97%   BMI 33.20 kg/m    Objective:   Physical Exam  Constitutional: He is oriented to person, place, and time. He appears well-nourished.  HENT:  Right Ear: Tympanic membrane and ear canal normal.  Left Ear: Tympanic membrane and ear canal normal.  Nose: Nose normal. Right sinus exhibits no maxillary sinus tenderness and no frontal sinus tenderness. Left sinus exhibits no maxillary sinus tenderness and no frontal sinus tenderness.  Mouth/Throat: Oropharynx is clear and moist.  Eyes: Conjunctivae and EOM are normal. Pupils are equal, round, and reactive to light.  Neck: Neck supple. Carotid bruit is not present. No thyromegaly present.  Cardiovascular: Normal rate and normal heart sounds.  Endorses long history of regular irregular rate. Was told by numerous MD's this was benign.   Pulmonary/Chest: Effort normal and breath sounds normal. He has no wheezes. He has no rales.  Abdominal: Soft. Bowel sounds are normal. There is no tenderness.  Musculoskeletal: Normal range of motion.  Neurological: He is alert and oriented to person, place, and time. He has normal reflexes. No cranial nerve deficit.  Skin: Skin is warm and dry.  Psychiatric: He has a normal mood and affect.          Assessment &  Plan:

## 2017-06-24 ENCOUNTER — Encounter: Payer: Self-pay | Admitting: Primary Care

## 2017-06-28 ENCOUNTER — Ambulatory Visit (INDEPENDENT_AMBULATORY_CARE_PROVIDER_SITE_OTHER): Admitting: Internal Medicine

## 2017-06-28 ENCOUNTER — Encounter: Payer: Self-pay | Admitting: Internal Medicine

## 2017-06-28 VITALS — BP 142/82 | HR 77 | Resp 16 | Ht 69.0 in | Wt 231.0 lb

## 2017-06-28 DIAGNOSIS — G4719 Other hypersomnia: Secondary | ICD-10-CM | POA: Diagnosis not present

## 2017-06-28 NOTE — Progress Notes (Addendum)
[facility] Consultation      Assessment and Plan:  Excessive daytime sleepiness --Symptoms and signs of obstructive sleep apnea.  --Will send for sleep study.    Date: 06/28/2017  MRN# [id number] Devon Jackson 03-18-1966    Devon Jackson is a 51 y.o. old male seen in consultation for chief complaint of:    Chief Complaint  Patient presents with  . Advice Only    Referred by Alma Friendly for evaluation of sleep.  . 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 He quit using cpap after losing 40lbs and some discomfort. Pt wants to d/c use and wants new sleep study to see if he needs cpap therapy.    HPI:   Patient is a 51 year old male with a history of known obstructive sleep apnea.  He notes some daytime sleepiness.  He has a history of sleep apnea tested over 10 years ago, has since stopped using it as he has lost about 40 pounds and was not feeling sleepy anymore.  He goes to bed between 10 and 10:30 PM.  He falls asleep within 5-10 minutes.  He usually gets out of bed at 5:45 AM.  He stopped using CPAP about 4-6 months ago as he did not like it, but he is a Games developer. He notes that he was having trouble breathing with the machine because the pressure was too high other times he did not get enough pressure. He started using it in 2009 and has been struggling with it since that time.  He is currently using breathe rite strips, his wife says that he snores less. He is a Dealer and drives a service truck.  No history of TMJ, no jaw pain.     PMHX:   Past Medical History:  Diagnosis Date  . Allergic rhinitis   . Frequent headaches   . Gout   . Hypertriglyceridemia   . Type 2 diabetes mellitus ([place])    Surgical Hx:  Past Surgical History:  Procedure Laterality Date  . APPENDECTOMY    . POLYPECTOMY    . ROTATOR CUFF REPAIR  2009  . SEPTOPLASTY     Family Hx:  Family History  Problem Relation Age of Onset  . Alcohol  abuse Mother   . Diabetes Father   . Breast cancer Paternal Grandmother   . Lung cancer Paternal Grandfather    Social Hx:   Social History   Tobacco Use  . Smoking status: Never Smoker  . Smokeless tobacco: Never Used  Substance Use Topics  . Alcohol use: Yes    Alcohol/week: 0.0 oz    Comment: rarely  . Drug use: No   Medication:    Current Outpatient Medications:  .  albuterol (PROAIR HFA) 108 (90 Base) MCG/ACT inhaler, Inhale 2 puffs into the lungs every 6 (six) hours as needed., Disp: 24 g, Rfl: 4 .  BD PEN NEEDLE NANO U/F 32G X 4 MM MISC, USE TO CHECK BLOOD SUGAR AS DIRECTED, Disp: 300 each, Rfl: 4 .  blood glucose meter kit and supplies KIT, Dispense based on patient and insurance preference. Use up to 3 times daily as directed. (Dx is E11.9)., Disp: 1 each, Rfl: 0 .  CIALIS 20 MG tablet, TAKE 1 TABLET DAILY AS NEEDED FOR ERECTILE DYSFUNCTION, Disp: 21 tablet, Rfl: 1 .  clonazePAM (KLONOPIN) 1 MG tablet, Take 1 tablet (1 mg total) by mouth as needed (severe headache)., Disp: 30 tablet, Rfl: 0 .  clotrimazole-betamethasone (LOTRISONE) cream, Apply topically., Disp: , Rfl:  .  colchicine 0.6 MG tablet, Take 0.6 mg by mouth as needed. , Disp: , Rfl:  .  cyclobenzaprine (FLEXERIL) 10 MG tablet, Take 1 tablet by mouth once to twice daily as needed for severe headaches., Disp: 60 tablet, Rfl: 1 .  DEXILANT 60 MG capsule, TAKE 1 CAPSULE AS NEEDED, Disp: 90 capsule, Rfl: 1 .  Exenatide ER 2 MG PEN, Inject 2 mg into the skin., Disp: , Rfl:  .  FREESTYLE LITE test strip, USE UP TO THREE TIMES A DAY AS DIRECTED, Disp: 300 each, Rfl: 1 .  JANUMET XR [phone] MG TB24, TAKE 1 TABLET AT BEDTIME, Disp: 90 tablet, Rfl: 1 .  Lancets (FREESTYLE) lancets, USE TO TEST BLOOD SUGAR 2 TO 3 TIMES DAILY AND AS DIRECTED, Disp: 300 each, Rfl: 1 .  LIPOFEN 150 MG CAPS, TAKE 1 CAPSULE AT BEDTIME, Disp: 90 capsule, Rfl: 1 .  metFORMIN (GLUCOPHAGE) 1000 MG tablet, Take 1,000 mg by mouth daily with  breakfast. , Disp: , Rfl:  .  montelukast (SINGULAIR) 10 MG tablet, TAKE 1 TABLET AT BEDTIME, Disp: 90 tablet, Rfl: 3   Allergies:  Atorvastatin  Review of Systems: Gen:  Denies  fever, sweats, chills HEENT: Denies blurred vision, double vision. bleeds, sore throat Cvc:  No dizziness, chest pain. Resp:   Denies cough or sputum production, shortness of breath Gi: Denies swallowing difficulty, stomach pain. Gu:  Denies bladder incontinence, burning urine Ext:   No Joint pain, stiffness. Skin: No skin rash,  hives  Endoc:  No polyuria, polydipsia. Psych: No depression, insomnia. Other:  All other systems were reviewed with the patient and were negative other that what is mentioned in the HPI.   Physical Examination:   VS: BP (!) 142/82 (BP Location: Left Arm, Cuff Size: Large)   Pulse 77   Resp 16   Ht 5' 9"  (1.753 m)   Wt 231 lb (104.8 kg)   SpO2 99%   BMI 34.11 kg/m   General Appearance: No distress  Neuro:without focal findings,  speech normal,  HEENT: PERRLA, EOM intact.   Pulmonary: normal breath sounds, No wheezing.  CardiovascularNormal S1,S2.  No m/r/g.   Abdomen: Benign, Soft, non-tender. Renal:  No costovertebral tenderness  GU:  No performed at this time. Endoc: No evident thyromegaly, no signs of acromegaly. Skin:   warm, no rashes, no ecchymosis  Extremities: normal, no cyanosis, clubbing.  Other findings:    LABORATORY PANEL:   CBC No results for input(s): WBC, HGB, HCT, PLT in the last 168 hours. ------------------------------------------------------------------------------------------------------------------  Chemistries  No results for input(s): NA, K, CL, CO2, GLUCOSE, BUN, CREATININE, CALCIUM, MG, AST, ALT, ALKPHOS, BILITOT in the last 168 hours.  Invalid input(s): GFRCGP ------------------------------------------------------------------------------------------------------------------  Cardiac Enzymes No results for input(s): TROPONINI in the  last 168 hours. ------------------------------------------------------------  RADIOLOGY:  No results found.     Thank  you for the consultation and for allowing [facility] to assist in the care of your patient. Our recommendations are noted above.  Please contact us if we can be of further service.   Marda Stalker, MD.  Board Certified in Internal Medicine, Pulmonary Medicine, [place], and Sleep Medicine.  [facility] Office Number: [phone]  Patricia Pesa, M.D.  Merton Border, M.D  06/28/2017

## 2017-06-28 NOTE — Patient Instructions (Signed)
--

## 2017-07-11 ENCOUNTER — Ambulatory Visit: Attending: Internal Medicine

## 2017-07-11 DIAGNOSIS — G4733 Obstructive sleep apnea (adult) (pediatric): Secondary | ICD-10-CM | POA: Diagnosis present

## 2017-07-11 DIAGNOSIS — G4719 Other hypersomnia: Secondary | ICD-10-CM | POA: Diagnosis not present

## 2017-07-11 DIAGNOSIS — R0683 Snoring: Secondary | ICD-10-CM | POA: Diagnosis not present

## 2017-07-11 DIAGNOSIS — R5383 Other fatigue: Secondary | ICD-10-CM | POA: Insufficient documentation

## 2017-07-24 ENCOUNTER — Telehealth: Payer: Self-pay | Admitting: *Deleted

## 2017-07-24 DIAGNOSIS — G4733 Obstructive sleep apnea (adult) (pediatric): Secondary | ICD-10-CM

## 2017-07-24 NOTE — Telephone Encounter (Signed)
Message left for patient to return call for sleep study results.

## 2017-07-26 NOTE — Telephone Encounter (Signed)
Patient aware of results of sleep study. Pt currently has his own cpap machine. Orders need to go to [facility].

## 2017-08-21 ENCOUNTER — Other Ambulatory Visit: Payer: Self-pay | Admitting: Primary Care

## 2017-08-21 DIAGNOSIS — E785 Hyperlipidemia, unspecified: Secondary | ICD-10-CM

## 2017-08-27 ENCOUNTER — Other Ambulatory Visit: Payer: Self-pay | Admitting: Primary Care

## 2017-08-27 DIAGNOSIS — Z794 Long term (current) use of insulin: Principal | ICD-10-CM

## 2017-08-27 DIAGNOSIS — E119 Type 2 diabetes mellitus without complications: Secondary | ICD-10-CM

## 2017-08-28 NOTE — Telephone Encounter (Signed)
Ok to refill? Electronically refill request for metFORMIN (GLUCOPHAGE) 1000 MG tablet  It just that was added on 06/23/2017. Last seen on 06/23/2017

## 2017-08-28 NOTE — Telephone Encounter (Signed)
Refill sent to pharmacy.   

## 2017-08-31 ENCOUNTER — Other Ambulatory Visit: Payer: Self-pay | Admitting: Primary Care

## 2017-08-31 DIAGNOSIS — K219 Gastro-esophageal reflux disease without esophagitis: Secondary | ICD-10-CM

## 2017-10-05 ENCOUNTER — Telehealth: Payer: Self-pay | Admitting: Internal Medicine

## 2017-10-05 NOTE — Telephone Encounter (Signed)
Pt calling stating is going to see his previous sleep doctors for future appointments  Nothing else needed.

## 2017-10-23 ENCOUNTER — Ambulatory Visit (INDEPENDENT_AMBULATORY_CARE_PROVIDER_SITE_OTHER): Admitting: Primary Care

## 2017-10-23 ENCOUNTER — Ambulatory Visit: Payer: Self-pay

## 2017-10-23 ENCOUNTER — Encounter: Payer: Self-pay | Admitting: Primary Care

## 2017-10-23 VITALS — BP 140/84 | HR 114 | Temp 98.0°F | Ht 69.0 in | Wt 235.0 lb

## 2017-10-23 DIAGNOSIS — R6889 Other general symptoms and signs: Secondary | ICD-10-CM | POA: Diagnosis not present

## 2017-10-23 DIAGNOSIS — J069 Acute upper respiratory infection, unspecified: Secondary | ICD-10-CM | POA: Diagnosis not present

## 2017-10-23 LAB — POC INFLUENZA A&B (BINAX/QUICKVUE)
INFLUENZA A, POC: POSITIVE — AB
Influenza B, POC: NEGATIVE

## 2017-10-23 MED ORDER — HYDROCOD POLST-CPM POLST ER 10-8 MG/5ML PO SUER: 5.0000 mL | Freq: Two times a day (BID) | ORAL | 0 refills | Status: DC | PRN

## 2017-10-23 MED ORDER — AZITHROMYCIN 250 MG PO TABS: ORAL_TABLET | ORAL | 0 refills | Status: DC

## 2017-10-23 NOTE — Progress Notes (Signed)
Subjective:    Patient ID: Devon Jackson, male    DOB: 10-Nov-1965, 52 y.o.   MRN: [id number]  HPI  Devon Jackson is a 52 year old male with a history of allergic rhinitis, OSA, type 2 diabetes who presents today with a chief complaint of cough.  He also reports fevers (101), nasal congestion. His last fever was yesterday. He's been taking Dayquil and Nyquil around the clock with reduction in fever.  He's expelling thick, yellow mucous from his nasal cavity. His symptoms began 5 days ago. He denies exposure to influenza or pneumonia. He had his flu shot this past season.    Review of Systems  Constitutional: Positive for fatigue and fever.  HENT: Positive for congestion, sinus pressure and sore throat.   Respiratory: Positive for cough.        Past Medical History:  Diagnosis Date  . Allergic rhinitis   . Frequent headaches   . Gout   . Hypertriglyceridemia   . Type 2 diabetes mellitus ([place])      Social History   Socioeconomic History  . Marital status: Married    Spouse name: Not on file  . Number of children: Not on file  . Years of education: Not on file  . Highest education level: Not on file  Occupational History  . Not on file  Social Needs  . Financial resource strain: Not on file  . Food insecurity:    Worry: Not on file    Inability: Not on file  . Transportation needs:    Medical: Not on file    Non-medical: Not on file  Tobacco Use  . Smoking status: Never Smoker  . Smokeless tobacco: Never Used  Substance and Sexual Activity  . Alcohol use: Yes    Alcohol/week: 0.0 oz    Comment: rarely  . Drug use: No  . Sexual activity: Not on file  Lifestyle  . Physical activity:    Days per week: Not on file    Minutes per session: Not on file  . Stress: Not on file  Relationships  . Social connections:    Talks on phone: Not on file    Gets together: Not on file    Attends religious service: Not on file    Active member of club or organization: Not on file      Attends meetings of clubs or organizations: Not on file    Relationship status: Not on file  . Intimate partner violence:    Fear of current or ex partner: Not on file    Emotionally abused: Not on file    Physically abused: Not on file    Forced sexual activity: Not on file  Other Topics Concern  . Not on file  Social History Narrative   Married.   3 children.   Works as a Therapist, nutritional.   Enjoys relaxing, spending time outdoors.    Past Surgical History:  Procedure Laterality Date  . APPENDECTOMY    . POLYPECTOMY    . ROTATOR CUFF REPAIR  2009  . SEPTOPLASTY      Family History  Problem Relation Age of Onset  . Alcohol abuse Mother   . Diabetes Father   . Breast cancer Paternal Grandmother   . Lung cancer Paternal Grandfather     Allergies  Allergen Reactions  . Atorvastatin     Other reaction(s): Myalgias (intolerance)    Current Outpatient Medications on File Prior to Visit  Medication Sig Dispense Refill  .  albuterol (PROAIR HFA) 108 (90 Base) MCG/ACT inhaler Inhale 2 puffs into the lungs every 6 (six) hours as needed. 24 g 4  . BD PEN NEEDLE NANO U/F 32G X 4 MM MISC USE TO CHECK BLOOD SUGAR AS DIRECTED 300 each 4  . blood glucose meter kit and supplies KIT Dispense based on patient and insurance preference. Use up to 3 times daily as directed. (Dx is E11.9). 1 each 0  . CIALIS 20 MG tablet TAKE 1 TABLET DAILY AS NEEDED FOR ERECTILE DYSFUNCTION 21 tablet 1  . clonazePAM (KLONOPIN) 1 MG tablet Take 1 tablet (1 mg total) by mouth as needed (severe headache). 30 tablet 0  . clotrimazole-betamethasone (LOTRISONE) cream Apply topically.    . colchicine 0.6 MG tablet Take 0.6 mg by mouth as needed.     . cyclobenzaprine (FLEXERIL) 10 MG tablet Take 1 tablet by mouth once to twice daily as needed for severe headaches. 60 tablet 1  . DEXILANT 60 MG capsule TAKE 1 CAPSULE AS NEEDED 90 capsule 1  . Exenatide ER 2 MG PEN Inject 2 mg into the skin.    Marland Kitchen FREESTYLE LITE  test strip USE UP TO THREE TIMES A DAY AS DIRECTED 300 each 1  . JANUMET XR [phone] MG TB24 TAKE 1 TABLET AT BEDTIME 90 tablet 1  . Lancets (FREESTYLE) lancets USE TO TEST BLOOD SUGAR 2 TO 3 TIMES DAILY AND AS DIRECTED 300 each 1  . LIPOFEN 150 MG CAPS TAKE 1 CAPSULE AT BEDTIME 90 capsule 1  . metFORMIN (GLUCOPHAGE) 1000 MG tablet TAKE 1 TABLET AT BEDTIME. 90 tablet 1  . montelukast (SINGULAIR) 10 MG tablet TAKE 1 TABLET AT BEDTIME 90 tablet 3   No current facility-administered medications on file prior to visit.     BP 140/84   Pulse (!) 114   Temp 98 F (36.7 C) (Oral)   Ht 5' 9"  (1.753 m)   Wt 235 lb (106.6 kg)   SpO2 95%   BMI 34.70 kg/m    Objective:   Physical Exam  Constitutional: He appears well-nourished. He appears ill.  HENT:  Right Ear: Tympanic membrane and ear canal normal.  Left Ear: Tympanic membrane and ear canal normal.  Nose: No mucosal edema. Right sinus exhibits no maxillary sinus tenderness and no frontal sinus tenderness. Left sinus exhibits no maxillary sinus tenderness and no frontal sinus tenderness.  Mouth/Throat: Oropharynx is clear and moist.  Eyes: Conjunctivae are normal.  Neck: Neck supple.  Cardiovascular: Regular rhythm. Tachycardia present.  Pulmonary/Chest: Effort normal and breath sounds normal. He has no wheezes. He has no rales.  Skin: Skin is warm and dry.          Assessment & Plan:  Influenza:  Cough, congestion, fatigue, fevers x 5 days. Exam today with overall clear lungs, sinus tachycardia, appears ill. Rapid Flu: Positive for A Out of window for Tamiflu treatment. Rx for Tussionex course sent to pharmacy to use PRN.  Fluids, rest, work note provided.   Pleas Koch, NP

## 2017-10-23 NOTE — Telephone Encounter (Signed)
Pt has appt 10/23/17 at 4 pm with Gentry Fitz NP.

## 2017-10-23 NOTE — Telephone Encounter (Signed)
Noted, will evaluate. 

## 2017-10-23 NOTE — Addendum Note (Signed)
Addended by: Jacqualin Combes on: 10/23/2017 04:29 PM   Modules accepted: Orders

## 2017-10-23 NOTE — Telephone Encounter (Signed)
Pt. Called with c/o sinus pressure in face, nasal congestion, and cough with intermittent wheeze.  Stated the initial symptoms started about 5 days ago.  Reported fever of 101 on Sat.; reported temp. today of 98 degrees. Coughing without much production.  Stated the nasal secretions are "yellow with streaks of blood.  Also c/o "feeling run down".  Has been taking Dayquil and Nyquil.  Reported he has shortness of breath with exertion, at his baseline.  Denied any increased shortness of breath at this time.  Care advice given per protocol.  Appt. scheduled today at 4:00 PM with PCP.  Verb. Understanding; agreed with plan.        Reason for Disposition . [1] Sinus pain (not just congestion) AND [2] fever  Answer Assessment - Initial Assessment Questions 1. LOCATION: "Where does it hurt?"      Pain below the eyes/ pressure 2. ONSET: "When did the sinus pain start?"  (e.g., hours, days)     Wednesday 3/27 3. SEVERITY: "How bad is the pain?"   (Scale 1-10; mild, moderate or severe)   - MILD (1-3): doesn't interfere with normal activities    - MODERATE (4-7): interferes with normal activities (e.g., work or school) or awakens from sleep   - SEVERE (8-10): excruciating pain and patient unable to do any normal activities       Mild 4. RECURRENT SYMPTOM: "Have you ever had sinus problems before?" If so, ask: "When was the last time?" and "What happened that time?"      Rarely gets sick; when I do, this is how it presents 5. NASAL CONGESTION: "Is the nose blocked?" If so, ask, "Can you open it or must you breathe through the mouth?"     Nasal congestion; intermittent blockage 6. NASAL DISCHARGE: "Do you have discharge from your nose?" If so ask, "What color?"     Yellow with red streaks 7. FEVER: "Do you have a fever?" If so, ask: "What is it, how was it measured, and when did it start?"      101 degrees on Saturday; 99 degrees on Sun.; 98 degrees this AM  8. OTHER SYMPTOMS: "Do you have any other  symptoms?" (e.g., sore throat, cough, earache, difficulty breathing)     Denied sore throat, or earache; c/o shortness of breath with mild exertion; denied increased SOB; coughing with intermittent wheeze   S 9. PREGNANCY: "Is there any chance you are pregnant?" "When was your last menstrual period?"    n/a  Protocols used: SINUS PAIN OR CONGESTION-A-AH

## 2017-10-23 NOTE — Patient Instructions (Addendum)
You may take the Tussionex cough suppressant twice daily as needed for cough and rest. Caution this medication contains codeine and will make you feel drowsy.  Continue Dayquil or Tylenol for fevers and cough symptoms.  Be sure to get plenty of fluids and rest.  It was a pleasure to see you today!   Influenza, Adult Influenza, more commonly known as "the flu," is a viral infection that primarily affects the respiratory tract. The respiratory tract includes organs that help you breathe, such as the lungs, nose, and throat. The flu causes many common cold symptoms, as well as a high fever and body aches. The flu spreads easily from person to person (is contagious). Getting a flu shot (influenza vaccination) every year is the best way to prevent influenza. What are the causes? Influenza is caused by a virus. You can catch the virus by:  Breathing in droplets from an infected person's cough or sneeze.  Touching something that was recently contaminated with the virus and then touching your mouth, nose, or eyes.  What increases the risk? The following factors may make you more likely to get the flu:  Not cleaning your hands frequently with soap and water or alcohol-based hand sanitizer.  Having close contact with many people during cold and flu season.  Touching your mouth, eyes, or nose without washing or sanitizing your hands first.  Not drinking enough fluids or not eating a healthy diet.  Not getting enough sleep or exercise.  Being under a high amount of stress.  Not getting a yearly (annual) flu shot.  You may be at a higher risk of complications from the flu, such as a severe lung infection (pneumonia), if you:  Are over the age of 53.  Are pregnant.  Have a weakened disease-fighting system (immune system). You may have a weakened immune system if you: ? Have HIV or AIDS. ? Are undergoing chemotherapy. ? Aretaking medicines that reduce the activity of (suppress) the  immune system.  Have a long-term (chronic) illness, such as heart disease, kidney disease, diabetes, or lung disease.  Have a liver disorder.  Are obese.  Have anemia.  What are the signs or symptoms? Symptoms of this condition typically last 4-10 days and may include:  Fever.  Chills.  Headache, body aches, or muscle aches.  Sore throat.  Cough.  Runny or congested nose.  Chest discomfort and cough.  Poor appetite.  Weakness or tiredness (fatigue).  Dizziness.  Nausea or vomiting.  How is this diagnosed? This condition may be diagnosed based on your medical history and a physical exam. Your health care provider may do a nose or throat swab test to confirm the diagnosis. How is this treated? If influenza is detected early, you can be treated with antiviral medicine that can reduce the length of your illness and the severity of your symptoms. This medicine may be given by mouth (orally) or through an IV tube that is inserted in one of your veins. The goal of treatment is to relieve symptoms by taking care of yourself at home. This may include taking over-the-counter medicines, drinking plenty of fluids, and adding humidity to the air in your home. In some cases, influenza goes away on its own. Severe influenza or complications from influenza may be treated in a hospital. Follow these instructions at home:  Take over-the-counter and prescription medicines only as told by your health care provider.  Use a cool mist humidifier to add humidity to the air in your home.  This can make breathing easier.  Rest as needed.  Drink enough fluid to keep your urine clear or pale yellow.  Cover your mouth and nose when you cough or sneeze.  Wash your hands with soap and water often, especially after you cough or sneeze. If soap and water are not available, use hand sanitizer.  Stay home from work or school as told by your health care provider. Unless you are visiting your health  care provider, try to avoid leaving home until your fever has been gone for 24 hours without the use of medicine.  Keep all follow-up visits as told by your health care provider. This is important. How is this prevented?  Getting an annual flu shot is the best way to avoid getting the flu. You may get the flu shot in late summer, fall, or winter. Ask your health care provider when you should get your flu shot.  Wash your hands often or use hand sanitizer often.  Avoid contact with people who are sick during cold and flu season.  Eat a healthy diet, drink plenty of fluids, get enough sleep, and exercise regularly. Contact a health care provider if:  You develop new symptoms.  You have: ? Chest pain. ? Diarrhea. ? A fever.  Your cough gets worse.  You produce more mucus.  You feel nauseous or you vomit. Get help right away if:  You develop shortness of breath or difficulty breathing.  Your skin or nails turn a bluish color.  You have severe pain or stiffness in your neck.  You develop a sudden headache or sudden pain in your face or ear.  You cannot stop vomiting. This information is not intended to replace advice given to you by your health care provider. Make sure you discuss any questions you have with your health care provider. Document Released: 07/08/2000 Document Revised: 12/17/2015 Document Reviewed: 05/05/2015 Elsevier Interactive Patient Education  2017 [employer].

## 2017-11-19 ENCOUNTER — Other Ambulatory Visit: Payer: Self-pay | Admitting: Primary Care

## 2017-11-19 DIAGNOSIS — J309 Allergic rhinitis, unspecified: Secondary | ICD-10-CM

## 2017-11-19 DIAGNOSIS — N529 Male erectile dysfunction, unspecified: Secondary | ICD-10-CM

## 2017-11-29 ENCOUNTER — Other Ambulatory Visit: Payer: Self-pay | Admitting: Primary Care

## 2017-12-22 ENCOUNTER — Other Ambulatory Visit: Payer: Self-pay | Admitting: Primary Care

## 2017-12-22 ENCOUNTER — Encounter: Payer: Self-pay | Admitting: Primary Care

## 2017-12-22 ENCOUNTER — Ambulatory Visit (INDEPENDENT_AMBULATORY_CARE_PROVIDER_SITE_OTHER): Admitting: Primary Care

## 2017-12-22 VITALS — BP 124/80 | HR 78 | Temp 98.2°F | Ht 69.0 in | Wt 239.8 lb

## 2017-12-22 DIAGNOSIS — K219 Gastro-esophageal reflux disease without esophagitis: Secondary | ICD-10-CM

## 2017-12-22 DIAGNOSIS — E1165 Type 2 diabetes mellitus with hyperglycemia: Secondary | ICD-10-CM | POA: Diagnosis not present

## 2017-12-22 DIAGNOSIS — E782 Mixed hyperlipidemia: Secondary | ICD-10-CM

## 2017-12-22 DIAGNOSIS — R51 Headache: Secondary | ICD-10-CM | POA: Diagnosis not present

## 2017-12-22 DIAGNOSIS — R519 Headache, unspecified: Secondary | ICD-10-CM

## 2017-12-22 LAB — COMPREHENSIVE METABOLIC PANEL
ALT: 22 U/L (ref 0–53)
AST: 17 U/L (ref 0–37)
Albumin: 4.4 g/dL (ref 3.5–5.2)
Alkaline Phosphatase: 37 U/L — ABNORMAL LOW (ref 39–117)
BUN: 21 mg/dL (ref 6–23)
CHLORIDE: 102 meq/L (ref 96–112)
CO2: 28 mEq/L (ref 19–32)
Calcium: 9.3 mg/dL (ref 8.4–10.5)
Creatinine, Ser: 1 mg/dL (ref 0.40–1.50)
GFR: 83.09 mL/min (ref >60.00)
GLUCOSE: 204 mg/dL — AB (ref 70–99)
POTASSIUM: 4.1 meq/L (ref 3.5–5.1)
SODIUM: 136 meq/L (ref 135–145)
Total Bilirubin: 1 mg/dL (ref 0.2–1.2)
Total Protein: 6.9 g/dL (ref 6.0–8.3)

## 2017-12-22 LAB — HEMOGLOBIN A1C: HEMOGLOBIN A1C: 7.9 % — AB (ref 4.6–6.5)

## 2017-12-22 LAB — MICROALBUMIN / CREATININE URINE RATIO
Creatinine,U: 88.7 mg/dL
MICROALB/CREAT RATIO: 1.1 mg/g (ref 0.0–30.0)
Microalb, Ur: 0.9 mg/dL (ref 0.0–1.9)

## 2017-12-22 MED ORDER — CYCLOBENZAPRINE HCL 10 MG PO TABS: ORAL_TABLET | ORAL | 0 refills | Status: DC

## 2017-12-22 NOTE — Progress Notes (Signed)
Subjective:    Patient ID: Devon Jackson, male    DOB: 1966-06-22, 52 y.o.   MRN: [id number]  HPI  Devon Jackson is a 52 year old male who presents today for follow up.  1) Type 2 Diabetes  Current medications include: Metformin 1000 mg HS, Janumet XR [phone] mg, Exenatide ER 2 mg weekly   He is not checking his blood glucose levels.   Last A1C: 6.3 in November 2018 Last Eye Exam: Due in June 2019 Last Foot Exam:  Pneumonia Vaccination: Completed in 2017 ACE/ARB: Urine microalbumin due today Statin: None, LDL of 97   Diet currently consists of:  Breakfast: Fast food Lunch: Take out food, restaurants Dinner: Meat, beans, fried vegetables, rice Snacks: Occasionally chips Desserts: Ice cream, 2-3 times weekly  Beverages: Diet soda, occasional water  Exercise: He does not exercise   2) Frequent Headaches: Currently managed on Cyclobenzaprine 10 mg and Clonazepam 1 mg PRN. He is taking the Flexeril and Clonazepam sparingly. He is needing a refill of Flexeril. Overall headaches have improved since his occupation has changed.   3) GERD: Currently managed on Dexilant 60 mg as needed and is currently taking 2-3 times weekly. He does experience intermittent nausea when waking, started taking Dexilant more frequently and nausea resolved. No abdominal pain or vomiting.      Review of Systems  Eyes: Negative for visual disturbance.  Respiratory: Negative for shortness of breath.   Cardiovascular: Negative for chest pain.  Gastrointestinal:       Intermittent GERD  Neurological: Negative for dizziness, numbness and headaches.       Past Medical History:  Diagnosis Date  . Allergic rhinitis   . Frequent headaches   . Gout   . Hypertriglyceridemia   . Type 2 diabetes mellitus ([place])      Social History   Socioeconomic History  . Marital status: Married    Spouse name: Not on file  . Number of children: Not on file  . Years of education: Not on file  . Highest  education level: Not on file  Occupational History  . Not on file  Social Needs  . Financial resource strain: Not on file  . Food insecurity:    Worry: Not on file    Inability: Not on file  . Transportation needs:    Medical: Not on file    Non-medical: Not on file  Tobacco Use  . Smoking status: Never Smoker  . Smokeless tobacco: Never Used  Substance and Sexual Activity  . Alcohol use: Yes    Alcohol/week: 0.0 oz    Comment: rarely  . Drug use: No  . Sexual activity: Not on file  Lifestyle  . Physical activity:    Days per week: Not on file    Minutes per session: Not on file  . Stress: Not on file  Relationships  . Social connections:    Talks on phone: Not on file    Gets together: Not on file    Attends religious service: Not on file    Active member of club or organization: Not on file    Attends meetings of clubs or organizations: Not on file    Relationship status: Not on file  . Intimate partner violence:    Fear of current or ex partner: Not on file    Emotionally abused: Not on file    Physically abused: Not on file    Forced sexual activity: Not on file  Other Topics Concern  .  Not on file  Social History Narrative   Married.   3 children.   Works as a Therapist, nutritional.   Enjoys relaxing, spending time outdoors.    Past Surgical History:  Procedure Laterality Date  . APPENDECTOMY    . POLYPECTOMY    . ROTATOR CUFF REPAIR  2009  . SEPTOPLASTY      Family History  Problem Relation Age of Onset  . Alcohol abuse Mother   . Diabetes Father   . Breast cancer Paternal Grandmother   . Lung cancer Paternal Grandfather     Allergies  Allergen Reactions  . Atorvastatin     Other reaction(s): Myalgias (intolerance)    Current Outpatient Medications on File Prior to Visit  Medication Sig Dispense Refill  . albuterol (PROAIR HFA) 108 (90 Base) MCG/ACT inhaler Inhale 2 puffs into the lungs every 6 (six) hours as needed. 24 g 4  . BD PEN NEEDLE NANO  U/F 32G X 4 MM MISC USE TO CHECK BLOOD SUGAR AS DIRECTED 300 each 4  . blood glucose meter kit and supplies KIT Dispense based on patient and insurance preference. Use up to 3 times daily as directed. (Dx is E11.9). 1 each 0  . chlorpheniramine-HYDROcodone (TUSSIONEX PENNKINETIC ER) 10-8 MG/5ML SUER Take 5 mLs by mouth every 12 (twelve) hours as needed for cough. 50 mL 0  . CIALIS 20 MG tablet TAKE 1 TABLET DAILY AS NEEDED FOR ERECTILE DYSFUNCTION 21 tablet 1  . clonazePAM (KLONOPIN) 1 MG tablet Take 1 tablet (1 mg total) by mouth as needed (severe headache). 30 tablet 0  . clotrimazole-betamethasone (LOTRISONE) cream Apply topically.    . colchicine 0.6 MG tablet Take 0.6 mg by mouth as needed.     Marland Kitchen DEXILANT 60 MG capsule TAKE 1 CAPSULE AS NEEDED 90 capsule 1  . FREESTYLE LITE test strip USE UP TO THREE TIMES A DAY AS DIRECTED 300 each 1  . JANUMET XR [phone] MG TB24 TAKE 1 TABLET AT BEDTIME 90 tablet 1  . Lancets (FREESTYLE) lancets USE TO TEST BLOOD SUGAR 2 TO 3 TIMES DAILY AND AS DIRECTED 300 each 1  . LIPOFEN 150 MG CAPS TAKE 1 CAPSULE AT BEDTIME 90 capsule 1  . meloxicam (MOBIC) 15 MG tablet TK 1 T PO QD WC  3  . metFORMIN (GLUCOPHAGE) 1000 MG tablet TAKE 1 TABLET AT BEDTIME. 90 tablet 1  . montelukast (SINGULAIR) 10 MG tablet TAKE 1 TABLET AT BEDTIME 90 tablet 3   No current facility-administered medications on file prior to visit.     BP 124/80   Pulse 78   Temp 98.2 F (36.8 C) (Oral)   Ht 5' 9"  (1.753 m)   Wt 239 lb 12 oz (108.7 kg)   SpO2 98%   BMI 35.40 kg/m    Objective:   Physical Exam  Constitutional: He appears well-nourished.  Neck: Neck supple.  Cardiovascular: Normal rate and regular rhythm.  Respiratory: Effort normal and breath sounds normal.  Skin: Skin is warm and dry.           Assessment & Plan:

## 2017-12-22 NOTE — Assessment & Plan Note (Signed)
Unfortunately is no longer eating a healthy diet, suspect A1C to be increased. Repeat A1C, urine microalbumin pending.  Continue Metformin and Janumet for now, consider adding in insulin again if needed. Await results.   Discussed to change diet and start exercising.   Foot exam today. Eye exam due in June, he is scheduled. Pneumonia vaccination UTD.  Follow up in 6 months.

## 2017-12-22 NOTE — Assessment & Plan Note (Signed)
Using Dexilant PRN, suspect waking with nausea is GERD occurring HS. Discussed to resume Dexilant daily if nausea or other symptoms return.

## 2017-12-22 NOTE — Patient Instructions (Addendum)
Stop by the lab prior to leaving today. I will notify you of your results once received.   You MUST work on improving your diet. Stop eating fast food, fried food, fatty foods. Increase vegetables, fruit, whole grains, lean protein.  Ensure you are consuming 64 ounces of water daily.  Start exercising. You should be getting 150 minutes of moderate intensity exercise weekly.  I'll be in touch soon regarding your lab results.   Please schedule a follow up appointment in 6 months for diabetes check and physical.   It was a pleasure to see you today!    Diabetes Mellitus and Nutrition When you have diabetes (diabetes mellitus), it is very important to have healthy eating habits because your blood sugar (glucose) levels are greatly affected by what you eat and drink. Eating healthy foods in the appropriate amounts, at about the same times every day, can help you:  Control your blood glucose.  Lower your risk of heart disease.  Improve your blood pressure.  Reach or maintain a healthy weight.  Every person with diabetes is different, and each person has different needs for a meal plan. Your health care provider may recommend that you work with a diet and nutrition specialist (dietitian) to make a meal plan that is best for you. Your meal plan may vary depending on factors such as:  The calories you need.  The medicines you take.  Your weight.  Your blood glucose, blood pressure, and cholesterol levels.  Your activity level.  Other health conditions you have, such as heart or kidney disease.  How do carbohydrates affect me? Carbohydrates affect your blood glucose level more than any other type of food. Eating carbohydrates naturally increases the amount of glucose in your blood. Carbohydrate counting is a method for keeping track of how many carbohydrates you eat. Counting carbohydrates is important to keep your blood glucose at a healthy level, especially if you use insulin or  take certain oral diabetes medicines. It is important to know how many carbohydrates you can safely have in each meal. This is different for every person. Your dietitian can help you calculate how many carbohydrates you should have at each meal and for snack. Foods that contain carbohydrates include:  Bread, cereal, rice, pasta, and crackers.  Potatoes and corn.  Peas, beans, and lentils.  Milk and yogurt.  Fruit and juice.  Desserts, such as cakes, cookies, ice cream, and candy.  How does alcohol affect me? Alcohol can cause a sudden decrease in blood glucose (hypoglycemia), especially if you use insulin or take certain oral diabetes medicines. Hypoglycemia can be a life-threatening condition. Symptoms of hypoglycemia (sleepiness, dizziness, and confusion) are similar to symptoms of having too much alcohol. If your health care provider says that alcohol is safe for you, follow these guidelines:  Limit alcohol intake to no more than 1 drink per day for nonpregnant women and 2 drinks per day for men. One drink equals 12 oz of beer, 5 oz of wine, or 1 oz of hard liquor.  Do not drink on an empty stomach.  Keep yourself hydrated with water, diet soda, or unsweetened iced tea.  Keep in mind that regular soda, juice, and other mixers may contain a lot of sugar and must be counted as carbohydrates.  What are tips for following this plan? Reading food labels  Start by checking the serving size on the label. The amount of calories, carbohydrates, fats, and other nutrients listed on the label are based on  one serving of the food. Many foods contain more than one serving per package.  Check the total grams (g) of carbohydrates in one serving. You can calculate the number of servings of carbohydrates in one serving by dividing the total carbohydrates by 15. For example, if a food has 30 g of total carbohydrates, it would be equal to 2 servings of carbohydrates.  Check the number of grams (g)  of saturated and trans fats in one serving. Choose foods that have low or no amount of these fats.  Check the number of milligrams (mg) of sodium in one serving. Most people should limit total sodium intake to less than 2,300 mg per day.  Always check the nutrition information of foods labeled as "low-fat" or "nonfat". These foods may be higher in added sugar or refined carbohydrates and should be avoided.  Talk to your dietitian to identify your daily goals for nutrients listed on the label. Shopping  Avoid buying canned, premade, or processed foods. These foods tend to be high in fat, sodium, and added sugar.  Shop around the outside edge of the grocery store. This includes fresh fruits and vegetables, bulk grains, fresh meats, and fresh dairy. Cooking  Use low-heat cooking methods, such as baking, instead of high-heat cooking methods like deep frying.  Cook using healthy oils, such as olive, canola, or sunflower oil.  Avoid cooking with butter, cream, or high-fat meats. Meal planning  Eat meals and snacks regularly, preferably at the same times every day. Avoid going long periods of time without eating.  Eat foods high in fiber, such as fresh fruits, vegetables, beans, and whole grains. Talk to your dietitian about how many servings of carbohydrates you can eat at each meal.  Eat 4-6 ounces of lean protein each day, such as lean meat, chicken, fish, eggs, or tofu. 1 ounce is equal to 1 ounce of meat, chicken, or fish, 1 egg, or 1/4 cup of tofu.  Eat some foods each day that contain healthy fats, such as avocado, nuts, seeds, and fish. Lifestyle   Check your blood glucose regularly.  Exercise at least 30 minutes 5 or more days each week, or as told by your health care provider.  Take medicines as told by your health care provider.  Do not use any products that contain nicotine or tobacco, such as cigarettes and e-cigarettes. If you need help quitting, ask your health care  provider.  Work with a Social worker or diabetes educator to identify strategies to manage stress and any emotional and social challenges. What are some questions to ask my health care provider?  Do I need to meet with a diabetes educator?  Do I need to meet with a dietitian?  What number can I call if I have questions?  When are the best times to check my blood glucose? Where to find more information:  American Diabetes Association: diabetes.org/food-and-fitness/food  Academy of Nutrition and Dietetics: PokerClues.dk  [employer] of Diabetes and Digestive and Kidney Diseases (NIH): ContactWire.be Summary  A healthy meal plan will help you control your blood glucose and maintain a healthy lifestyle.  Working with a diet and nutrition specialist (dietitian) can help you make a meal plan that is best for you.  Keep in mind that carbohydrates and alcohol have immediate effects on your blood glucose levels. It is important to count carbohydrates and to use alcohol carefully. This information is not intended to replace advice given to you by your health care provider. Make sure you  discuss any questions you have with your health care provider. Document Released: 04/07/2005 Document Revised: 08/15/2016 Document Reviewed: 08/15/2016 Elsevier Interactive Patient Education  [employer].

## 2017-12-22 NOTE — Assessment & Plan Note (Signed)
Overall infrequent since changes in occupation. Refill sent for Flexeril. He is using both Flexeril and Clonazepam very sparingly. Continue same.

## 2018-02-17 ENCOUNTER — Other Ambulatory Visit: Payer: Self-pay | Admitting: Primary Care

## 2018-02-17 DIAGNOSIS — E785 Hyperlipidemia, unspecified: Secondary | ICD-10-CM

## 2018-02-24 ENCOUNTER — Other Ambulatory Visit: Payer: Self-pay | Admitting: Primary Care

## 2018-02-24 DIAGNOSIS — E119 Type 2 diabetes mellitus without complications: Secondary | ICD-10-CM

## 2018-02-24 DIAGNOSIS — Z794 Long term (current) use of insulin: Principal | ICD-10-CM

## 2018-02-27 ENCOUNTER — Other Ambulatory Visit: Payer: Self-pay | Admitting: Primary Care

## 2018-02-27 DIAGNOSIS — K219 Gastro-esophageal reflux disease without esophagitis: Secondary | ICD-10-CM

## 2018-02-27 NOTE — Telephone Encounter (Signed)
Last prescribed on 08/31/2017. Last office visit on 12/22/2017.

## 2018-02-28 NOTE — Telephone Encounter (Signed)
Noted, refill sent to pharmacy. 

## 2018-05-18 ENCOUNTER — Other Ambulatory Visit: Payer: Self-pay | Admitting: Primary Care

## 2018-05-18 DIAGNOSIS — E785 Hyperlipidemia, unspecified: Secondary | ICD-10-CM

## 2018-05-18 DIAGNOSIS — N529 Male erectile dysfunction, unspecified: Secondary | ICD-10-CM

## 2018-05-28 ENCOUNTER — Other Ambulatory Visit: Payer: Self-pay | Admitting: Primary Care

## 2018-05-28 DIAGNOSIS — K219 Gastro-esophageal reflux disease without esophagitis: Secondary | ICD-10-CM

## 2018-06-19 ENCOUNTER — Other Ambulatory Visit: Payer: Self-pay | Admitting: Primary Care

## 2018-06-19 DIAGNOSIS — Z794 Long term (current) use of insulin: Secondary | ICD-10-CM

## 2018-06-19 DIAGNOSIS — Z125 Encounter for screening for malignant neoplasm of prostate: Secondary | ICD-10-CM

## 2018-06-19 DIAGNOSIS — E782 Mixed hyperlipidemia: Secondary | ICD-10-CM

## 2018-06-19 DIAGNOSIS — E119 Type 2 diabetes mellitus without complications: Secondary | ICD-10-CM

## 2018-06-25 ENCOUNTER — Other Ambulatory Visit (INDEPENDENT_AMBULATORY_CARE_PROVIDER_SITE_OTHER)

## 2018-06-25 DIAGNOSIS — Z125 Encounter for screening for malignant neoplasm of prostate: Secondary | ICD-10-CM | POA: Diagnosis not present

## 2018-06-25 DIAGNOSIS — E119 Type 2 diabetes mellitus without complications: Secondary | ICD-10-CM | POA: Diagnosis not present

## 2018-06-25 DIAGNOSIS — Z794 Long term (current) use of insulin: Secondary | ICD-10-CM | POA: Diagnosis not present

## 2018-06-25 DIAGNOSIS — E782 Mixed hyperlipidemia: Secondary | ICD-10-CM | POA: Diagnosis not present

## 2018-06-25 LAB — LIPID PANEL
CHOL/HDL RATIO: 7
Cholesterol: 198 mg/dL (ref 0–200)
HDL: 30.1 mg/dL — ABNORMAL LOW (ref >39.00)
NonHDL: 167.62
Triglycerides: 376 mg/dL — ABNORMAL HIGH (ref 0.0–149.0)
VLDL: 75.2 mg/dL — ABNORMAL HIGH (ref 0.0–40.0)

## 2018-06-25 LAB — COMPREHENSIVE METABOLIC PANEL
ALK PHOS: 46 U/L (ref 39–117)
ALT: 17 U/L (ref 0–53)
AST: 9 U/L (ref 0–37)
Albumin: 4.4 g/dL (ref 3.5–5.2)
BUN: 17 mg/dL (ref 6–23)
CO2: 29 meq/L (ref 19–32)
CREATININE: 1.04 mg/dL (ref 0.40–1.50)
Calcium: 9.4 mg/dL (ref 8.4–10.5)
Chloride: 101 mEq/L (ref 96–112)
GFR: 79.26 mL/min (ref >60.00)
Glucose, Bld: 249 mg/dL — ABNORMAL HIGH (ref 70–99)
Potassium: 4.2 mEq/L (ref 3.5–5.1)
SODIUM: 139 meq/L (ref 135–145)
TOTAL PROTEIN: 6.7 g/dL (ref 6.0–8.3)
Total Bilirubin: 0.7 mg/dL (ref 0.2–1.2)

## 2018-06-25 LAB — PSA: PSA: 0.72 ng/mL (ref 0.10–4.00)

## 2018-06-25 LAB — HEMOGLOBIN A1C: Hgb A1c MFr Bld: 9.3 % — ABNORMAL HIGH (ref 4.6–6.5)

## 2018-06-25 LAB — LDL CHOLESTEROL, DIRECT: LDL DIRECT: 123 mg/dL

## 2018-06-28 ENCOUNTER — Encounter: Admitting: Primary Care

## 2018-06-28 ENCOUNTER — Encounter: Payer: Self-pay | Admitting: Primary Care

## 2018-06-28 ENCOUNTER — Ambulatory Visit (INDEPENDENT_AMBULATORY_CARE_PROVIDER_SITE_OTHER): Admitting: Primary Care

## 2018-06-28 VITALS — BP 124/86 | HR 85 | Temp 98.1°F | Ht 69.0 in | Wt 238.5 lb

## 2018-06-28 DIAGNOSIS — G4733 Obstructive sleep apnea (adult) (pediatric): Secondary | ICD-10-CM

## 2018-06-28 DIAGNOSIS — E119 Type 2 diabetes mellitus without complications: Secondary | ICD-10-CM

## 2018-06-28 DIAGNOSIS — K219 Gastro-esophageal reflux disease without esophagitis: Secondary | ICD-10-CM | POA: Diagnosis not present

## 2018-06-28 DIAGNOSIS — Z23 Encounter for immunization: Secondary | ICD-10-CM | POA: Diagnosis not present

## 2018-06-28 DIAGNOSIS — J309 Allergic rhinitis, unspecified: Secondary | ICD-10-CM

## 2018-06-28 DIAGNOSIS — E782 Mixed hyperlipidemia: Secondary | ICD-10-CM

## 2018-06-28 DIAGNOSIS — R519 Headache, unspecified: Secondary | ICD-10-CM

## 2018-06-28 DIAGNOSIS — Z0000 Encounter for general adult medical examination without abnormal findings: Secondary | ICD-10-CM | POA: Diagnosis not present

## 2018-06-28 DIAGNOSIS — R51 Headache: Secondary | ICD-10-CM

## 2018-06-28 DIAGNOSIS — Z794 Long term (current) use of insulin: Secondary | ICD-10-CM

## 2018-06-28 MED ORDER — METFORMIN HCL 1000 MG PO TABS: 1000.0000 mg | ORAL_TABLET | Freq: Two times a day (BID) | ORAL | 3 refills | Status: DC

## 2018-06-28 MED ORDER — INSULIN DETEMIR 100 UNIT/ML FLEXPEN: 10.0000 [IU] | PEN_INJECTOR | Freq: Every day | SUBCUTANEOUS | 5 refills | Status: DC

## 2018-06-28 MED ORDER — ROSUVASTATIN CALCIUM 5 MG PO TABS: 5.0000 mg | ORAL_TABLET | Freq: Every day | ORAL | 3 refills | Status: DC

## 2018-06-28 NOTE — Progress Notes (Signed)
Subjective:    Patient ID: Devon Jackson, male    DOB: 1966/04/05, 52 y.o.   MRN: [id number]  HPI  Devon Jackson is a 52 year old male who presents today for complete physical.  Immunizations: -Tetanus: Completed in 2018 -Influenza: Due -Pneumonia: Completed in 2017   Diet: He endorses  Breakfast: Fast food Lunch: Fast food Dinner: Meat, starch, vegetable Snacks: Chips, cakes Desserts: 3 days weekly  Beverages: Milk, diet soda, no water  Exercise: Active at work, no exercise Eye exam: Completed in 2019 Dental exam: Completes semi-annually  Colonoscopy: Completed in 2017, due in 2020 PSA: 0.72 in 2019   Review of Systems  Constitutional: Negative for unexpected weight change.  HENT: Negative for rhinorrhea.   Respiratory: Negative for cough and shortness of breath.   Cardiovascular: Negative for chest pain.  Gastrointestinal: Negative for constipation and diarrhea.  Genitourinary: Negative for difficulty urinating.  Musculoskeletal: Positive for arthralgias.       Chronic, intermittent back pain and hip pain  Skin: Negative for rash.  Allergic/Immunologic: Negative for environmental allergies.  Neurological: Negative for dizziness, numbness and headaches.  Psychiatric/Behavioral: The patient is not nervous/anxious.        Past Medical History:  Diagnosis Date  . Allergic rhinitis   . Frequent headaches   . Gout   . Hypertriglyceridemia   . Type 2 diabetes mellitus ([place])      Social History   Socioeconomic History  . Marital status: Married    Spouse name: Not on file  . Number of children: Not on file  . Years of education: Not on file  . Highest education level: Not on file  Occupational History  . Not on file  Social Needs  . Financial resource strain: Not on file  . Food insecurity:    Worry: Not on file    Inability: Not on file  . Transportation needs:    Medical: Not on file    Non-medical: Not on file  Tobacco Use  . Smoking status: Never  Smoker  . Smokeless tobacco: Never Used  Substance and Sexual Activity  . Alcohol use: Yes    Alcohol/week: 0.0 standard drinks    Comment: rarely  . Drug use: No  . Sexual activity: Not on file  Lifestyle  . Physical activity:    Days per week: Not on file    Minutes per session: Not on file  . Stress: Not on file  Relationships  . Social connections:    Talks on phone: Not on file    Gets together: Not on file    Attends religious service: Not on file    Active member of club or organization: Not on file    Attends meetings of clubs or organizations: Not on file    Relationship status: Not on file  . Intimate partner violence:    Fear of current or ex partner: Not on file    Emotionally abused: Not on file    Physically abused: Not on file    Forced sexual activity: Not on file  Other Topics Concern  . Not on file  Social History Narrative   Married.   3 children.   Works as a Therapist, nutritional.   Enjoys relaxing, spending time outdoors.    Past Surgical History:  Procedure Laterality Date  . APPENDECTOMY    . POLYPECTOMY    . ROTATOR CUFF REPAIR  2009  . SEPTOPLASTY      Family History  Problem Relation  Age of Onset  . Alcohol abuse Mother   . Diabetes Father   . Breast cancer Paternal Grandmother   . Lung cancer Paternal Grandfather     Allergies  Allergen Reactions  . Atorvastatin     Other reaction(s): Myalgias (intolerance)    Current Outpatient Medications on File Prior to Visit  Medication Sig Dispense Refill  . albuterol (PROAIR HFA) 108 (90 Base) MCG/ACT inhaler Inhale 2 puffs into the lungs every 6 (six) hours as needed. 24 g 4  . BD PEN NEEDLE NANO U/F 32G X 4 MM MISC USE TO CHECK BLOOD SUGAR AS DIRECTED 300 each 4  . blood glucose meter kit and supplies KIT Dispense based on patient and insurance preference. Use up to 3 times daily as directed. (Dx is E11.9). 1 each 0  . CIALIS 20 MG tablet TAKE 1 TABLET DAILY AS NEEDED FOR ERECTILE  DYSFUNCTION 21 tablet 0  . clotrimazole-betamethasone (LOTRISONE) cream Apply topically.    . cyclobenzaprine (FLEXERIL) 10 MG tablet Take 1 tablet by mouth once to twice daily as needed for severe headaches. 60 tablet 0  . FREESTYLE LITE test strip USE UP TO THREE TIMES A DAY AS DIRECTED 300 each 1  . JANUMET XR [phone] MG TB24 TAKE 1 TABLET AT BEDTIME 90 tablet 1  . Lancets (FREESTYLE) lancets USE TO TEST BLOOD SUGAR 2 TO 3 TIMES DAILY AND AS DIRECTED 300 each 1  . LIPOFEN 150 MG CAPS TAKE 1 CAPSULE AT BEDTIME 90 capsule 1  . meloxicam (MOBIC) 15 MG tablet TK 1 T PO QD WC  3  . metFORMIN (GLUCOPHAGE) 1000 MG tablet TAKE 1 TABLET AT BEDTIME. 90 tablet 1  . montelukast (SINGULAIR) 10 MG tablet TAKE 1 TABLET AT BEDTIME 90 tablet 3   No current facility-administered medications on file prior to visit.     BP 124/86   Pulse 85   Temp 98.1 F (36.7 C) (Oral)   Ht 5' 9"  (1.753 m)   Wt 238 lb 8 oz (108.2 kg)   SpO2 98%   BMI 35.22 kg/m    Objective:   Physical Exam  Constitutional: He is oriented to person, place, and time. He appears well-nourished.  HENT:  Mouth/Throat: No oropharyngeal exudate.  Eyes: Pupils are equal, round, and reactive to light. EOM are normal.  Neck: Neck supple. No thyromegaly present.  Cardiovascular: Normal rate and regular rhythm.  Respiratory: Effort normal and breath sounds normal.  GI: Soft. Bowel sounds are normal. There is no tenderness.  Musculoskeletal: Normal range of motion.  Neurological: He is alert and oriented to person, place, and time.  Skin: Skin is warm and dry.  Psychiatric: He has a normal mood and affect.           Assessment & Plan:

## 2018-06-28 NOTE — Addendum Note (Signed)
Addended by: Jacqualin Combes on: 06/28/2018 08:48 AM   Modules accepted: Orders

## 2018-06-28 NOTE — Assessment & Plan Note (Signed)
Dexilant no longer covered on insurance. He mostly uses the Dexilant PRN. Will have him start with OTC Pepcid PRN, he will update if symptoms return.

## 2018-06-28 NOTE — Patient Instructions (Addendum)
Stop lipofen.  Start rosuvastatin (Crestor) for cholesterol. Take 1 tablet by mouth once daily.  Stop Janumet. Start Metformin 1000 mg twice daily.   Start Levemir insulin. Inject 10 units into the skin every evening.  It is important that you improve your diet. Please limit carbohydrates in the form of white bread, rice, pasta, sweets, fast food, fried food, sugary drinks, etc. Increase your consumption of fresh fruits and vegetables, whole grains, lean protein.  Ensure you are consuming 64 ounces of water daily.  Start exercising. You should be getting 150 minutes of moderate intensity exercise weekly.  You can try famotidine 20 mg (Pepcid) as needed for heartburn symptoms.   You can try generic Xyzal for allergies if needed. This can be taken with Singulair.   Schedule a follow up visit in 3 months for diabetes check.  It was a pleasure to see you today!

## 2018-06-28 NOTE — Assessment & Plan Note (Signed)
Overall stable, using Singulair PRN during Summer weather.

## 2018-06-28 NOTE — Assessment & Plan Note (Signed)
Overall improved since regular use of CPAP machine.

## 2018-06-28 NOTE — Assessment & Plan Note (Signed)
Immunizations UTD.  PSA UTD. Colonoscopy UTD, due in 2020. Discussed the importance of a healthy diet and regular exercise in order for weight loss, and to reduce the risk of any potential medical problems. Exam stable. Labs reviewed. Follow up in 1 year for CPE.

## 2018-06-28 NOTE — Assessment & Plan Note (Signed)
Compliant to CPAP machine nightly, continue same. 

## 2018-06-28 NOTE — Assessment & Plan Note (Addendum)
Recent A1C increased to 9.3. Likely secondary to poor diet and lack of exercise.   Fasting glucose running mid 100's-high 200's Evening glucose same.   Stop janumet ER [phone] mg. Per his request. Increase Metformin to 1000 mg BID.  Start Levemir 10 units HS. Rx sent to pharmacy.  Discussed to continue to monitor glucose and report readings that are consistently above 200.  Follow up in 3 months.

## 2018-06-28 NOTE — Assessment & Plan Note (Addendum)
LDL above goal on recent labs. He is agreeable to trying Crestor, 5 mg tablets sent to pharmacy.   Repeat lipids and LFT's at next visit.

## 2018-07-02 ENCOUNTER — Encounter: Payer: Self-pay | Admitting: *Deleted

## 2018-07-08 DIAGNOSIS — Z794 Long term (current) use of insulin: Principal | ICD-10-CM

## 2018-07-08 DIAGNOSIS — E782 Mixed hyperlipidemia: Secondary | ICD-10-CM

## 2018-07-08 DIAGNOSIS — E119 Type 2 diabetes mellitus without complications: Secondary | ICD-10-CM

## 2018-07-09 MED ORDER — INSULIN DETEMIR 100 UNIT/ML FLEXPEN: 10.0000 [IU] | PEN_INJECTOR | Freq: Every day | SUBCUTANEOUS | 5 refills | Status: DC

## 2018-07-10 MED ORDER — ROSUVASTATIN CALCIUM 5 MG PO TABS: 5.0000 mg | ORAL_TABLET | Freq: Every day | ORAL | 1 refills | Status: DC

## 2018-07-10 MED ORDER — METFORMIN HCL 1000 MG PO TABS: 1000.0000 mg | ORAL_TABLET | Freq: Two times a day (BID) | ORAL | 1 refills | Status: DC

## 2018-07-23 DIAGNOSIS — Z794 Long term (current) use of insulin: Principal | ICD-10-CM

## 2018-07-23 DIAGNOSIS — E119 Type 2 diabetes mellitus without complications: Secondary | ICD-10-CM

## 2018-07-25 MED ORDER — INSULIN DETEMIR 100 UNIT/ML FLEXPEN: 15.0000 [IU] | PEN_INJECTOR | Freq: Every day | SUBCUTANEOUS | 5 refills | Status: DC

## 2018-08-16 ENCOUNTER — Other Ambulatory Visit: Payer: Self-pay | Admitting: Primary Care

## 2018-08-16 DIAGNOSIS — N529 Male erectile dysfunction, unspecified: Secondary | ICD-10-CM

## 2018-09-16 DIAGNOSIS — E119 Type 2 diabetes mellitus without complications: Secondary | ICD-10-CM

## 2018-09-16 DIAGNOSIS — Z794 Long term (current) use of insulin: Principal | ICD-10-CM

## 2018-09-17 MED ORDER — INSULIN DETEMIR 100 UNIT/ML FLEXPEN: 15.0000 [IU] | PEN_INJECTOR | Freq: Every day | SUBCUTANEOUS | 0 refills | Status: DC

## 2018-09-28 ENCOUNTER — Encounter: Payer: Self-pay | Admitting: Primary Care

## 2018-09-28 ENCOUNTER — Ambulatory Visit (INDEPENDENT_AMBULATORY_CARE_PROVIDER_SITE_OTHER): Admitting: Primary Care

## 2018-09-28 VITALS — BP 124/84 | HR 89 | Temp 98.1°F | Ht 69.0 in | Wt 236.0 lb

## 2018-09-28 DIAGNOSIS — Z794 Long term (current) use of insulin: Secondary | ICD-10-CM | POA: Diagnosis not present

## 2018-09-28 DIAGNOSIS — E782 Mixed hyperlipidemia: Secondary | ICD-10-CM

## 2018-09-28 DIAGNOSIS — E119 Type 2 diabetes mellitus without complications: Secondary | ICD-10-CM | POA: Diagnosis not present

## 2018-09-28 LAB — HEPATIC FUNCTION PANEL
ALBUMIN: 4.6 g/dL (ref 3.5–5.2)
ALT: 19 U/L (ref 0–53)
AST: 12 U/L (ref 0–37)
Alkaline Phosphatase: 53 U/L (ref 39–117)
Bilirubin, Direct: 0.2 mg/dL (ref 0.0–0.3)
Total Bilirubin: 0.9 mg/dL (ref 0.2–1.2)
Total Protein: 7 g/dL (ref 6.0–8.3)

## 2018-09-28 LAB — LIPID PANEL
Cholesterol: 128 mg/dL (ref 0–200)
HDL: 31.4 mg/dL — AB (ref >39.00)
LDL Cholesterol: 63 mg/dL (ref 0–99)
NonHDL: 97.05
Total CHOL/HDL Ratio: 4
Triglycerides: 169 mg/dL — ABNORMAL HIGH (ref 0.0–149.0)
VLDL: 33.8 mg/dL (ref 0.0–40.0)

## 2018-09-28 LAB — POCT GLYCOSYLATED HEMOGLOBIN (HGB A1C): Hemoglobin A1C: 7.5 % — AB (ref 4.0–5.6)

## 2018-09-28 MED ORDER — INSULIN DETEMIR 100 UNIT/ML FLEXPEN: 45.0000 [IU] | PEN_INJECTOR | Freq: Every day | SUBCUTANEOUS | 5 refills | Status: DC

## 2018-09-28 NOTE — Patient Instructions (Signed)
Stop by the lab prior to leaving today. I will notify you of your results once received.   We've increased your Levemir to 45 units daily. Continue to monitor your blood sugar twice daily.  Please message me in 1 month if no improvement in glucose readings.  It is important that you improve your diet. Please limit carbohydrates in the form of white bread, rice, pasta, sweets, fast food, fried food, sugary drinks, etc. Increase your consumption of fresh fruits and vegetables, whole grains, lean protein.  Ensure you are consuming 64 ounces of water daily.  Schedule a lab only appointment for 3 months for A1C check.  It was a pleasure to see you today!   Diabetes Mellitus and Nutrition, Adult When you have diabetes (diabetes mellitus), it is very important to have healthy eating habits because your blood sugar (glucose) levels are greatly affected by what you eat and drink. Eating healthy foods in the appropriate amounts, at about the same times every day, can help you:  Control your blood glucose.  Lower your risk of heart disease.  Improve your blood pressure.  Reach or maintain a healthy weight. Every person with diabetes is different, and each person has different needs for a meal plan. Your health care provider may recommend that you work with a diet and nutrition specialist (dietitian) to make a meal plan that is best for you. Your meal plan may vary depending on factors such as:  The calories you need.  The medicines you take.  Your weight.  Your blood glucose, blood pressure, and cholesterol levels.  Your activity level.  Other health conditions you have, such as heart or kidney disease. How do carbohydrates affect me? Carbohydrates, also called carbs, affect your blood glucose level more than any other type of food. Eating carbs naturally raises the amount of glucose in your blood. Carb counting is a method for keeping track of how many carbs you eat. Counting carbs is  important to keep your blood glucose at a healthy level, especially if you use insulin or take certain oral diabetes medicines. It is important to know how many carbs you can safely have in each meal. This is different for every person. Your dietitian can help you calculate how many carbs you should have at each meal and for each snack. Foods that contain carbs include:  Bread, cereal, rice, pasta, and crackers.  Potatoes and corn.  Peas, beans, and lentils.  Milk and yogurt.  Fruit and juice.  Desserts, such as cakes, cookies, ice cream, and candy. How does alcohol affect me? Alcohol can cause a sudden decrease in blood glucose (hypoglycemia), especially if you use insulin or take certain oral diabetes medicines. Hypoglycemia can be a life-threatening condition. Symptoms of hypoglycemia (sleepiness, dizziness, and confusion) are similar to symptoms of having too much alcohol. If your health care provider says that alcohol is safe for you, follow these guidelines:  Limit alcohol intake to no more than 1 drink per day for nonpregnant women and 2 drinks per day for men. One drink equals 12 oz of beer, 5 oz of wine, or 1 oz of hard liquor.  Do not drink on an empty stomach.  Keep yourself hydrated with water, diet soda, or unsweetened iced tea.  Keep in mind that regular soda, juice, and other mixers may contain a lot of sugar and must be counted as carbs. What are tips for following this plan?  Reading food labels  Start by checking the serving size  on the "Nutrition Facts" label of packaged foods and drinks. The amount of calories, carbs, fats, and other nutrients listed on the label is based on one serving of the item. Many items contain more than one serving per package.  Check the total grams (g) of carbs in one serving. You can calculate the number of servings of carbs in one serving by dividing the total carbs by 15. For example, if a food has 30 g of total carbs, it would be  equal to 2 servings of carbs.  Check the number of grams (g) of saturated and trans fats in one serving. Choose foods that have low or no amount of these fats.  Check the number of milligrams (mg) of salt (sodium) in one serving. Most people should limit total sodium intake to less than 2,300 mg per day.  Always check the nutrition information of foods labeled as "low-fat" or "nonfat". These foods may be higher in added sugar or refined carbs and should be avoided.  Talk to your dietitian to identify your daily goals for nutrients listed on the label. Shopping  Avoid buying canned, premade, or processed foods. These foods tend to be high in fat, sodium, and added sugar.  Shop around the outside edge of the grocery store. This includes fresh fruits and vegetables, bulk grains, fresh meats, and fresh dairy. Cooking  Use low-heat cooking methods, such as baking, instead of high-heat cooking methods like deep frying.  Cook using healthy oils, such as olive, canola, or sunflower oil.  Avoid cooking with butter, cream, or high-fat meats. Meal planning  Eat meals and snacks regularly, preferably at the same times every day. Avoid going long periods of time without eating.  Eat foods high in fiber, such as fresh fruits, vegetables, beans, and whole grains. Talk to your dietitian about how many servings of carbs you can eat at each meal.  Eat 4-6 ounces (oz) of lean protein each day, such as lean meat, chicken, fish, eggs, or tofu. One oz of lean protein is equal to: ? 1 oz of meat, chicken, or fish. ? 1 egg. ?  cup of tofu.  Eat some foods each day that contain healthy fats, such as avocado, nuts, seeds, and fish. Lifestyle  Check your blood glucose regularly.  Exercise regularly as told by your health care provider. This may include: ? 150 minutes of moderate-intensity or vigorous-intensity exercise each week. This could be brisk walking, biking, or water aerobics. ? Stretching and  doing strength exercises, such as yoga or weightlifting, at least 2 times a week.  Take medicines as told by your health care provider.  Do not use any products that contain nicotine or tobacco, such as cigarettes and e-cigarettes. If you need help quitting, ask your health care provider.  Work with a Social worker or diabetes educator to identify strategies to manage stress and any emotional and social challenges. Questions to ask a health care provider  Do I need to meet with a diabetes educator?  Do I need to meet with a dietitian?  What number can I call if I have questions?  When are the best times to check my blood glucose? Where to find more information:  American Diabetes Association: diabetes.org  Academy of Nutrition and Dietetics: www.eatright.[employer] of Diabetes and Digestive and Kidney Diseases (NIH): [employer] Summary  A healthy meal plan will help you control your blood glucose and maintain a healthy lifestyle.  Working with a diet and nutrition specialist (dietitian)  can help you make a meal plan that is best for you.  Keep in mind that carbohydrates (carbs) and alcohol have immediate effects on your blood glucose levels. It is important to count carbs and to use alcohol carefully. This information is not intended to replace advice given to you by your health care provider. Make sure you discuss any questions you have with your health care provider. Document Released: 04/07/2005 Document Revised: 02/08/2017 Document Reviewed: 08/15/2016 Elsevier Interactive Patient Education  2019 [employer].

## 2018-09-28 NOTE — Assessment & Plan Note (Signed)
Repeat lipids pending today. "brain fog" since initiation of Crestor, not having myalgias as he did on Liptior.  Will have him hold Crestor for 2 weeks, update if symptoms improve/do not improve. Consider switching to pravastatin if he notices improvement off of Crestor.

## 2018-09-28 NOTE — Progress Notes (Signed)
Subjective:    Patient ID: Devon Jackson, male    DOB: [birth year], 53 y.o.   MRN: [id number]  HPI  Mr. Devon Jackson is a 53 year old male who presents today for follow up of diabetes.  He was last evaluated in December 2019. He sent a message through the My Chart portal endorsing that he had increased his Levemir in his own and was up to 40 units. He increased his Levemir to 40 units 1 month ago.  Current medications include: Levemir 40 units, Metformin 1000 mg BID.   He is checking his blood glucose 2 times daily and is getting readings of: AM fasting: 150's Bedtime: 170's  Last A1C: 9.3 in December 2019 Last Eye Exam: Due in June 2020 Last Foot Exam: Due in May 2020 Pneumonia Vaccination: Completed in 2017 ACE/ARB: Urine microalbumin negative in May 2019 Statin: Crestor  Diet currently consists of:  Breakfast: Fast food (sausage/egg/cheese english muffin with hashbrowns) Lunch: Fast food (hamburgers, fries, chicken nuggets, wings with fries, chinese take out) Dinner: Meat (sometimes fried), vegetable (fried sometimes), starch, bean  Snacks: Chips, pepperoni, cookies Desserts: 3 times weekly  Beverages: Diet soda, little water, sweet tea with Stevia  Exercise: He is not exercising, active at work   [employer] from Last 3 Encounters:  09/28/18 236 lb (107 kg)  06/28/18 238 lb 8 oz (108.2 kg)  12/22/17 239 lb 12 oz (108.7 kg)     Review of Systems  Respiratory: Negative for shortness of breath.   Cardiovascular: Negative for chest pain.  Musculoskeletal: Negative for myalgias.  Neurological: Negative for dizziness and numbness.       Endorses some "brain fog" since starting Crestor in December 2019, had this reaction with Lipitor before.        Past Medical History:  Diagnosis Date  . Allergic rhinitis   . Frequent headaches   . Gout   . Hypertriglyceridemia   . Type 2 diabetes mellitus ([place])      Social History   Socioeconomic History  . Marital status:  Married    Spouse name: Not on file  . Number of children: Not on file  . Years of education: Not on file  . Highest education level: Not on file  Occupational History  . Not on file  Social Needs  . Financial resource strain: Not on file  . Food insecurity:    Worry: Not on file    Inability: Not on file  . Transportation needs:    Medical: Not on file    Non-medical: Not on file  Tobacco Use  . Smoking status: Never Smoker  . Smokeless tobacco: Never Used  Substance and Sexual Activity  . Alcohol use: Yes    Alcohol/week: 0.0 standard drinks    Comment: rarely  . Drug use: No  . Sexual activity: Not on file  Lifestyle  . Physical activity:    Days per week: Not on file    Minutes per session: Not on file  . Stress: Not on file  Relationships  . Social connections:    Talks on phone: Not on file    Gets together: Not on file    Attends religious service: Not on file    Active member of club or organization: Not on file    Attends meetings of clubs or organizations: Not on file    Relationship status: Not on file  . Intimate partner violence:    Fear of current or ex partner: Not on  file    Emotionally abused: Not on file    Physically abused: Not on file    Forced sexual activity: Not on file  Other Topics Concern  . Not on file  Social History Narrative   Married.   3 children.   Works as a Therapist, nutritional.   Enjoys relaxing, spending time outdoors.    Past Surgical History:  Procedure Laterality Date  . APPENDECTOMY    . POLYPECTOMY    . ROTATOR CUFF REPAIR  2009  . SEPTOPLASTY      Family History  Problem Relation Age of Onset  . Alcohol abuse Mother   . Diabetes Father   . Breast cancer Paternal Grandmother   . Lung cancer Paternal Grandfather     Allergies  Allergen Reactions  . Atorvastatin     Other reaction(s): Myalgias (intolerance)    Current Outpatient Medications on File Prior to Visit  Medication Sig Dispense Refill  . albuterol  (PROAIR HFA) 108 (90 Base) MCG/ACT inhaler Inhale 2 puffs into the lungs every 6 (six) hours as needed. 24 g 4  . BD PEN NEEDLE NANO U/F 32G X 4 MM MISC USE TO CHECK BLOOD SUGAR AS DIRECTED 300 each 4  . blood glucose meter kit and supplies KIT Dispense based on patient and insurance preference. Use up to 3 times daily as directed. (Dx is E11.9). 1 each 0  . CIALIS 20 MG tablet TAKE 1 TABLET DAILY AS NEEDED FOR ERECTILE DYSFUNCTION 21 tablet 1  . clotrimazole-betamethasone (LOTRISONE) cream Apply topically.    . cyclobenzaprine (FLEXERIL) 10 MG tablet Take 1 tablet by mouth once to twice daily as needed for severe headaches. 60 tablet 0  . FREESTYLE LITE test strip USE UP TO THREE TIMES A DAY AS DIRECTED 300 each 1  . Lancets (FREESTYLE) lancets USE TO TEST BLOOD SUGAR 2 TO 3 TIMES DAILY AND AS DIRECTED 300 each 1  . meloxicam (MOBIC) 15 MG tablet TK 1 T PO QD WC  3  . metFORMIN (GLUCOPHAGE) 1000 MG tablet Take 1 tablet (1,000 mg total) by mouth 2 (two) times daily with a meal. For diabetes. 180 tablet 1  . montelukast (SINGULAIR) 10 MG tablet TAKE 1 TABLET AT BEDTIME 90 tablet 3  . rosuvastatin (CRESTOR) 5 MG tablet Take 1 tablet (5 mg total) by mouth daily. For cholesterol. 90 tablet 1   No current facility-administered medications on file prior to visit.     BP 124/84   Pulse 89   Temp 98.1 F (36.7 C) (Oral)   Ht 5' 9"  (1.753 m)   Wt 236 lb (107 kg)   SpO2 95%   BMI 34.85 kg/m    Objective:   Physical Exam  Constitutional: He appears well-nourished.  Neck: Neck supple.  Cardiovascular: Normal rate and regular rhythm.  Respiratory: Effort normal and breath sounds normal.  Skin: Skin is warm and dry.           Assessment & Plan:

## 2018-09-28 NOTE — Assessment & Plan Note (Signed)
A1C today improved to 7.5, not at goal. Managed on statin. Urine microalbumin negative in May 2019, due again this May. Pneumonia vaccination UTD. Eye and foot exams UTD.  Strongly advised he work on his diet as it mostly consists of fried/fast food. Encouraged increased water intake.  Increase Levemir to 45 units HS. Repeat A1C in 3 months.

## 2018-11-05 ENCOUNTER — Encounter: Payer: Self-pay | Admitting: Primary Care

## 2018-11-05 ENCOUNTER — Telehealth: Admitting: Physician Assistant

## 2018-11-05 ENCOUNTER — Other Ambulatory Visit: Payer: Self-pay

## 2018-11-05 ENCOUNTER — Ambulatory Visit (INDEPENDENT_AMBULATORY_CARE_PROVIDER_SITE_OTHER): Admitting: Primary Care

## 2018-11-05 VITALS — BP 126/82 | HR 88 | Temp 98.1°F | Ht 69.0 in | Wt 236.8 lb

## 2018-11-05 DIAGNOSIS — Z91030 Bee allergy status: Secondary | ICD-10-CM | POA: Diagnosis not present

## 2018-11-05 DIAGNOSIS — T783XXA Angioneurotic edema, initial encounter: Secondary | ICD-10-CM

## 2018-11-05 DIAGNOSIS — E782 Mixed hyperlipidemia: Secondary | ICD-10-CM | POA: Diagnosis not present

## 2018-11-05 MED ORDER — EPINEPHRINE 0.3 MG/0.3ML IJ SOAJ: 0.3000 mg | INTRAMUSCULAR | 0 refills | Status: DC | PRN

## 2018-11-05 MED ORDER — PRAVASTATIN SODIUM 40 MG PO TABS: 40.0000 mg | ORAL_TABLET | Freq: Every day | ORAL | 3 refills | Status: DC

## 2018-11-05 MED ORDER — METHYLPREDNISOLONE ACETATE 80 MG/ML IJ SUSP
80.0000 mg | Freq: Once | INTRAMUSCULAR | Status: AC
  Administered 2018-11-05: 80 mg via INTRAMUSCULAR

## 2018-11-05 NOTE — Addendum Note (Signed)
Addended by: Jacqualin Combes on: 11/05/2018 11:31 AM   Modules accepted: Orders

## 2018-11-05 NOTE — Assessment & Plan Note (Signed)
"  Brain Fog" improved off of Crestor.  Rx for Pravastatin sent to pharmacy. He will update.

## 2018-11-05 NOTE — Patient Instructions (Signed)
Use the EpiPen as discussed if needed.  Try Pravastatin 40 mg tablets for cholesterol and heart protection. Update me if you have any problems.  It was a pleasure to see you today!  Epinephrine injection (Auto-injector) What is this medicine? EPINEPHRINE (ep i NEF rin) is used for the emergency treatment of severe allergic reactions. You should keep this medicine with you at all times. This medicine may be used for other purposes; ask your health care provider or pharmacist if you have questions. COMMON BRAND NAME(S): Adrenaclick, Auvi-Q, epinephrinesnap, epinephrinesnap-v, EpiPen, EPIsnap Epinephrine, SYMJEPI, Twinject What should I tell my health care provider before I take this medicine? They need to know if you have any of the following conditions: -diabetes -heart disease -high blood pressure -lung or breathing disease, like asthma -Parkinson's disease -thyroid disease -an unusual or allergic reaction to epinephrine, sulfites, other medicines, foods, dyes, or preservatives -pregnant or trying to get pregnant -breast-feeding How should I use this medicine? This medicine is for injection into the outer thigh. Your doctor or health care professional will instruct you on the proper use of the device during an emergency. Read all directions carefully and make sure you understand them. Do not use more often than directed. Talk to your pediatrician regarding the use of this medicine in children. Special care may be needed. This drug is commonly used in children. A special device is available for use in children. If you are giving this medicine to a young child, hold their leg firmly in place before and during the injection to prevent injury. Overdosage: If you think you have taken too much of this medicine contact a poison control center or emergency room at once. NOTE: This medicine is only for you. Do not share this medicine with others. What if I miss a dose? This does not apply. You  should only use this medicine for an allergic reaction. What may interact with this medicine? This medicine is only used during an emergency. Significant drug interactions are not likely during emergency use. This list may not describe all possible interactions. Give your health care provider a list of all the medicines, herbs, non-prescription drugs, or dietary supplements you use. Also tell them if you smoke, drink alcohol, or use illegal drugs. Some items may interact with your medicine. What should I watch for while using this medicine? Keep this medicine ready for use in the case of a severe allergic reaction. Make sure that you have the phone number of your doctor or health care professional and local hospital ready. Remember to check the expiration date of your medicine regularly. You may need to have additional units of this medicine with you at work, school, or other places. Talk to your doctor or health care professional about your need for extra units. Some emergencies may require an additional dose. Check with your doctor or a health care professional before using an extra dose. After use, go to the nearest hospital or call 911. Avoid physical activity. Make sure the treating health care professional knows you have received an injection of this medicine. You will receive additional instructions on what to do during and after use of this medicine before a medical emergency occurs. What side effects may I notice from receiving this medicine? Side effects that you should report to your doctor or health care professional as soon as possible: -allergic reactions like skin rash, itching or hives, swelling of the face, lips, or tongue -breathing problems -chest pain -fast, irregular heartbeat -pain, tingling,  numbness in the hands or feet -pain, redness, or irritation at site where injected -vomiting Side effects that usually do not require medical attention (report to your doctor or health care  professional if they continue or are bothersome): -anxious -dizziness -dry mouth -headache -increased sweating -nausea -unusually weak or tired This list may not describe all possible side effects. Call your doctor for medical advice about side effects. You may report side effects to FDA at 1-800-FDA-1088. Where should I keep my medicine? Keep out of the reach of children. Store at room temperature between 15 and 30 degrees C (59 and 86 degrees F). Protect from light and heat. The solution should be clear in color. If the solution is discolored or contains particles it must be replaced. Throw away any unused medicine after the expiration date. Ask your doctor or pharmacist about proper disposal of the injector if it is expired or has been used. Always replace your auto-injector before it expires. NOTE: This sheet is a summary. It may not cover all possible information. If you have questions about this medicine, talk to your doctor, pharmacist, or health care provider.  2019 Elsevier/Gold Standard (2014-12-15 12:24:50)

## 2018-11-05 NOTE — Progress Notes (Signed)
Based on what you shared with me, I feel your condition warrants further evaluation and I recommend that you be seen for a face to face office visit.     NOTE: If you entered your credit card information for this eVisit, you will not be charged. You may see a "hold" on your card for the $35 but that hold will drop off and you will not have a charge processed.  If you are having a true medical emergency please call 911.  If you need an urgent face to face visit, [place] has four urgent care centers for your convenience.    PLEASE NOTE: THE [facility] LOCATIONS AND URGENT CARE CLINICS DO NOT HAVE THE TESTING FOR CORONAVIRUS COVID19 AVAILABLE.  IF YOU FEEL YOU NEED THIS TEST YOU MUST GO TO A TRIAGE LOCATION AT ONE OF THE HOSPITAL EMERGENCY DEPARTMENTS   [email or web address] to reserve your spot online an avoid wait times  [facility] [address], [address] [place], [place] [address] Modified hours of operation: Monday-Friday, 10 AM to 6 PM  Saturday & Sunday 10 AM to 4 PM *Across the street from [employer]  [facility] (New Address!) [address], [address] [place], [place] [address] *Just off [address], across the road from [employer]* Modified hours of operation: Monday-Friday, 10 AM to 5 PM  Closed Saturday & Sunday   The following sites will take your insurance:  . [facility]  [phone] Get Driving Directions Find a Provider at this Location  [address] , [place] [address] . 10 am to 8 pm Monday-Friday . 12 pm to 8 pm Saturday-Sunday   . [facility]  [phone] Get Driving Directions Find a Provider at this Location  [address], [address] [place], [place] [address] . 8 am to 8 pm Monday-Friday . 9 am to 6 pm Saturday . 11 am to 6 pm Sunday   . [facility] [place]  [phone] Get Driving Directions  [address] [place], [place] [address] . 8 am to 8 pm Monday-Friday . 8 am to 4 pm Saturday-Sunday   Your e-visit answers were reviewed by a board certified advanced clinical practitioner to complete your personal care plan.  Thank you for using e-Visits. 

## 2018-11-05 NOTE — Telephone Encounter (Signed)
Devon Jackson, will you get him scheduled for an in office visit? Thanks!

## 2018-11-05 NOTE — Progress Notes (Signed)
Subjective:    Patient ID: Devon Jackson, male    DOB: [birth year], 53 y.o.   MRN: [id number]  HPI  Devon Jackson is a 53 year old male with a history of allergic rhinitis, type 2 diabetes, GERD who presents today with a chief complaint of bee sting.  He was evaluated through an e-visit through [facility] for bee sting today and given the answers to his questions he was referred for an in person visit for further evaluation.   He was stung by a honey bee to the tip of the nose and right right occipital lobe. He was stung two days ago. He immediately noticed redness, swelling to the nose, bilateral cheeks, and right occipital lobe. He denies shortness of breath, wheezing, throat tightness. He did notice some lip tingling 1-2 hours after the initial sting, this dissipated that same day. He also noticed itching to his hands and feet. Later that day he took three benadryl at two separate points. He took one Benadryl yesterday morning.   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 He does not possess an Epi Pen.  Of note, he has noticed decrease in "brain fog" since he stopped Crestor. He could not tolerate Lipitor due to myalgias in the past. He is open to trying other treatment.   Review of Systems  Constitutional: Negative for fever.  HENT: Negative for trouble swallowing and voice change.   Respiratory: Negative for shortness of breath and wheezing.   Skin:       Swelling and erythema of nose, cheeks, right occipital lobes       Past Medical History:  Diagnosis Date  . Allergic rhinitis   . Frequent headaches   . Gout   . Hypertriglyceridemia   . Type 2 diabetes mellitus ([place])      Social History   Socioeconomic History  . Marital status: Married    Spouse name: Not on file  . Number of children: Not on file  . Years of education: Not on file  . Highest education level: Not on file  Occupational History  . Not on file  Social Needs  . Financial resource strain: Not on file  . Food  insecurity:    Worry: Not on file    Inability: Not on file  . Transportation needs:    Medical: Not on file    Non-medical: Not on file  Tobacco Use  . Smoking status: Never Smoker  . Smokeless tobacco: Never Used  Substance and Sexual Activity  . Alcohol use: Yes    Alcohol/week: 0.0 standard drinks    Comment: rarely  . Drug use: No  . Sexual activity: Not on file  Lifestyle  . Physical activity:    Days per week: Not on file    Minutes per session: Not on file  . Stress: Not on file  Relationships  . Social connections:    Talks on phone: Not on file    Gets together: Not on file    Attends religious service: Not on file    Active member of club or organization: Not on file    Attends meetings of clubs or organizations: Not on file    Relationship status: Not on file  . Intimate partner violence:    Fear of current or ex partner: Not on file    Emotionally abused: Not on file    Physically abused: Not on file    Forced sexual activity: Not on file  Other Topics Concern  .  Not on file  Social History Narrative   Married.   3 children.   Works as a Therapist, nutritional.   Enjoys relaxing, spending time outdoors.    Past Surgical History:  Procedure Laterality Date  . APPENDECTOMY    . POLYPECTOMY    . ROTATOR CUFF REPAIR  2009  . SEPTOPLASTY      Family History  Problem Relation Age of Onset  . Alcohol abuse Mother   . Diabetes Father   . Breast cancer Paternal Grandmother   . Lung cancer Paternal Grandfather     Allergies  Allergen Reactions  . Atorvastatin     Other reaction(s): Myalgias (intolerance)    Current Outpatient Medications on File Prior to Visit  Medication Sig Dispense Refill  . albuterol (PROAIR HFA) 108 (90 Base) MCG/ACT inhaler Inhale 2 puffs into the lungs every 6 (six) hours as needed. 24 g 4  . BD PEN NEEDLE NANO U/F 32G X 4 MM MISC USE TO CHECK BLOOD SUGAR AS DIRECTED 300 each 4  . blood glucose meter kit and supplies KIT Dispense  based on patient and insurance preference. Use up to 3 times daily as directed. (Dx is E11.9). 1 each 0  . CIALIS 20 MG tablet TAKE 1 TABLET DAILY AS NEEDED FOR ERECTILE DYSFUNCTION 21 tablet 1  . clotrimazole-betamethasone (LOTRISONE) cream Apply topically.    . cyclobenzaprine (FLEXERIL) 10 MG tablet Take 1 tablet by mouth once to twice daily as needed for severe headaches. 60 tablet 0  . FREESTYLE LITE test strip USE UP TO THREE TIMES A DAY AS DIRECTED 300 each 1  . Insulin Detemir (LEVEMIR) 100 UNIT/ML Pen Inject 45 Units into the skin at bedtime. 15 mL 5  . Lancets (FREESTYLE) lancets USE TO TEST BLOOD SUGAR 2 TO 3 TIMES DAILY AND AS DIRECTED 300 each 1  . meloxicam (MOBIC) 15 MG tablet TK 1 T PO QD WC  3  . metFORMIN (GLUCOPHAGE) 1000 MG tablet Take 1 tablet (1,000 mg total) by mouth 2 (two) times daily with a meal. For diabetes. 180 tablet 1  . montelukast (SINGULAIR) 10 MG tablet TAKE 1 TABLET AT BEDTIME 90 tablet 3  . rosuvastatin (CRESTOR) 5 MG tablet Take 1 tablet (5 mg total) by mouth daily. For cholesterol. 90 tablet 1   No current facility-administered medications on file prior to visit.     BP 126/82   Pulse 88   Temp 98.1 F (36.7 C) (Oral)   Ht 5' 9"  (1.753 m)   Wt 236 lb 12 oz (107.4 kg)   SpO2 97%   BMI 34.96 kg/m    Objective:   Physical Exam  Constitutional: He appears well-nourished.  Eyes:  No angioedema   Neck: Neck supple.  Cardiovascular: Normal rate and regular rhythm.  Respiratory: Effort normal and breath sounds normal. No respiratory distress. He has no wheezes.  Speaking in complete sentences   Skin: Skin is warm and dry.  Mild swelling with erythema to nose, bilateral cheeks, and right occipital lobe. No surrounding erythema or cellulitis            Assessment & Plan:

## 2018-11-05 NOTE — Assessment & Plan Note (Signed)
Stable in the office today, no distress. IM Depo Medrol 80 mg provided today for residual symptoms. Rx for EpiPen provided to use in the future if needed, discussed instructions for use.

## 2018-11-14 ENCOUNTER — Other Ambulatory Visit: Payer: Self-pay | Admitting: Primary Care

## 2018-11-14 DIAGNOSIS — J309 Allergic rhinitis, unspecified: Secondary | ICD-10-CM

## 2018-11-29 ENCOUNTER — Other Ambulatory Visit: Payer: Self-pay | Admitting: Primary Care

## 2018-11-29 DIAGNOSIS — E782 Mixed hyperlipidemia: Secondary | ICD-10-CM

## 2018-11-29 MED ORDER — PRAVASTATIN SODIUM 40 MG PO TABS: 40.0000 mg | ORAL_TABLET | Freq: Every day | ORAL | 1 refills | Status: DC

## 2018-12-26 ENCOUNTER — Other Ambulatory Visit: Payer: Self-pay | Admitting: Primary Care

## 2018-12-26 DIAGNOSIS — E119 Type 2 diabetes mellitus without complications: Secondary | ICD-10-CM

## 2019-01-01 ENCOUNTER — Other Ambulatory Visit (INDEPENDENT_AMBULATORY_CARE_PROVIDER_SITE_OTHER)

## 2019-01-01 ENCOUNTER — Other Ambulatory Visit

## 2019-01-01 DIAGNOSIS — E119 Type 2 diabetes mellitus without complications: Secondary | ICD-10-CM

## 2019-01-01 DIAGNOSIS — Z794 Long term (current) use of insulin: Secondary | ICD-10-CM

## 2019-01-01 LAB — POCT GLYCOSYLATED HEMOGLOBIN (HGB A1C): Hemoglobin A1C: 7.4 % — AB (ref 4.0–5.6)

## 2019-01-03 MED ORDER — INSULIN DETEMIR 100 UNIT/ML FLEXPEN: 50.0000 [IU] | PEN_INJECTOR | Freq: Every day | SUBCUTANEOUS | 5 refills | Status: DC

## 2019-02-12 ENCOUNTER — Other Ambulatory Visit: Payer: Self-pay | Admitting: Primary Care

## 2019-02-12 DIAGNOSIS — N529 Male erectile dysfunction, unspecified: Secondary | ICD-10-CM

## 2019-02-14 DIAGNOSIS — N529 Male erectile dysfunction, unspecified: Secondary | ICD-10-CM

## 2019-02-15 NOTE — Telephone Encounter (Signed)
I looked over the fax papers this morning and it looks like Cialis 20 mg is no longer covers.  Preferred alternatives  Sildenafil 25 mg, 50 mg, or 100 mg  Tadalafil 2.5 mg, 5 mg, 10 mg, or 20 mg  Noted from pharmacy that there is a plan limit #30 = 90 day supply for all ED drugs

## 2019-02-18 MED ORDER — TADALAFIL 20 MG PO TABS: ORAL_TABLET | ORAL | 0 refills | Status: DC

## 2019-02-26 MED ORDER — TADALAFIL 20 MG PO TABS: ORAL_TABLET | ORAL | 0 refills | Status: DC

## 2019-03-02 ENCOUNTER — Other Ambulatory Visit: Payer: Self-pay | Admitting: Primary Care

## 2019-03-02 DIAGNOSIS — E119 Type 2 diabetes mellitus without complications: Secondary | ICD-10-CM

## 2019-03-02 DIAGNOSIS — Z794 Long term (current) use of insulin: Secondary | ICD-10-CM

## 2019-03-10 ENCOUNTER — Other Ambulatory Visit: Payer: Self-pay | Admitting: Primary Care

## 2019-03-10 DIAGNOSIS — Z794 Long term (current) use of insulin: Secondary | ICD-10-CM

## 2019-03-10 DIAGNOSIS — E119 Type 2 diabetes mellitus without complications: Secondary | ICD-10-CM

## 2019-03-22 LAB — HM DIABETES EYE EXAM

## 2019-03-26 ENCOUNTER — Encounter: Payer: Self-pay | Admitting: Primary Care

## 2019-03-27 ENCOUNTER — Other Ambulatory Visit: Payer: Self-pay | Admitting: Primary Care

## 2019-03-27 DIAGNOSIS — E119 Type 2 diabetes mellitus without complications: Secondary | ICD-10-CM

## 2019-03-27 DIAGNOSIS — Z794 Long term (current) use of insulin: Secondary | ICD-10-CM

## 2019-04-03 ENCOUNTER — Other Ambulatory Visit: Payer: Self-pay

## 2019-04-03 ENCOUNTER — Other Ambulatory Visit (INDEPENDENT_AMBULATORY_CARE_PROVIDER_SITE_OTHER)

## 2019-04-03 DIAGNOSIS — Z794 Long term (current) use of insulin: Secondary | ICD-10-CM

## 2019-04-03 DIAGNOSIS — E119 Type 2 diabetes mellitus without complications: Secondary | ICD-10-CM

## 2019-04-03 LAB — MICROALBUMIN / CREATININE URINE RATIO
Creatinine,U: 23.8 mg/dL
Microalb Creat Ratio: 2.9 mg/g (ref 0.0–30.0)
Microalb, Ur: <0.7 mg/dL (ref 0.0–1.9)

## 2019-04-03 LAB — POCT GLYCOSYLATED HEMOGLOBIN (HGB A1C): Hemoglobin A1C: 6.9 % — AB (ref 4.0–5.6)

## 2019-04-16 ENCOUNTER — Other Ambulatory Visit: Payer: Self-pay | Admitting: Primary Care

## 2019-04-16 DIAGNOSIS — N529 Male erectile dysfunction, unspecified: Secondary | ICD-10-CM

## 2019-04-30 LAB — HM COLONOSCOPY

## 2019-05-05 ENCOUNTER — Other Ambulatory Visit: Payer: Self-pay | Admitting: Primary Care

## 2019-05-05 DIAGNOSIS — K219 Gastro-esophageal reflux disease without esophagitis: Secondary | ICD-10-CM

## 2019-05-06 ENCOUNTER — Encounter: Payer: Self-pay | Admitting: Primary Care

## 2019-05-08 NOTE — Telephone Encounter (Signed)
Noted Rx sent to pharmacy. Please notify patient that he is due for CPE in December 2020. He will need this for further refills of medications.

## 2019-05-08 NOTE — Telephone Encounter (Signed)
Rx have not been prescribed. Last appointment on 11/05/2018 . No future appointment

## 2019-05-13 ENCOUNTER — Other Ambulatory Visit: Payer: Self-pay

## 2019-05-13 ENCOUNTER — Emergency Department
Admission: EM | Admit: 2019-05-13 | Discharge: 2019-05-13 | Disposition: A | Discharge status: Home / Self Care | Attending: Emergency Medicine | Admitting: Emergency Medicine

## 2019-05-13 ENCOUNTER — Emergency Department

## 2019-05-13 DIAGNOSIS — K2970 Gastritis, unspecified, without bleeding: Secondary | ICD-10-CM

## 2019-05-13 DIAGNOSIS — K8020 Calculus of gallbladder without cholecystitis without obstruction: Secondary | ICD-10-CM | POA: Diagnosis not present

## 2019-05-13 DIAGNOSIS — K760 Fatty (change of) liver, not elsewhere classified: Secondary | ICD-10-CM | POA: Insufficient documentation

## 2019-05-13 DIAGNOSIS — Z79899 Other long term (current) drug therapy: Secondary | ICD-10-CM | POA: Insufficient documentation

## 2019-05-13 DIAGNOSIS — E119 Type 2 diabetes mellitus without complications: Secondary | ICD-10-CM | POA: Insufficient documentation

## 2019-05-13 DIAGNOSIS — Z794 Long term (current) use of insulin: Secondary | ICD-10-CM | POA: Diagnosis not present

## 2019-05-13 DIAGNOSIS — R1013 Epigastric pain: Secondary | ICD-10-CM | POA: Diagnosis present

## 2019-05-13 LAB — CBC
HCT: 44.3 % (ref 39.0–52.0)
Hemoglobin: 15.1 g/dL (ref 13.0–17.0)
MCH: 29.3 pg (ref 26.0–34.0)
MCHC: 34.1 g/dL (ref 30.0–36.0)
MCV: 86 fL (ref 80.0–100.0)
Platelets: 207 10*3/uL (ref 150–400)
RBC: 5.15 MIL/uL (ref 4.22–5.81)
RDW: 12.4 % (ref 11.5–15.5)
WBC: 5.1 10*3/uL (ref 4.0–10.5)
nRBC: 0 % (ref 0.0–0.2)

## 2019-05-13 LAB — BASIC METABOLIC PANEL
Anion gap: 10 (ref 5–15)
BUN: 14 mg/dL (ref 6–20)
CO2: 25 mmol/L (ref 22–32)
Calcium: 9 mg/dL (ref 8.9–10.3)
Chloride: 100 mmol/L (ref 98–111)
Creatinine, Ser: 0.67 mg/dL (ref 0.61–1.24)
GFR calc Af Amer: >60 mL/min (ref >60)
GFR calc non Af Amer: >60 mL/min (ref >60)
Glucose, Bld: 168 mg/dL — ABNORMAL HIGH (ref 70–99)
Potassium: 3.8 mmol/L (ref 3.5–5.1)
Sodium: 135 mmol/L (ref 135–145)

## 2019-05-13 LAB — TROPONIN I (HIGH SENSITIVITY): Troponin I (High Sensitivity): <2 ng/L (ref <18)

## 2019-05-13 MED ORDER — PANTOPRAZOLE SODIUM 20 MG PO TBEC: 20.0000 mg | DELAYED_RELEASE_TABLET | Freq: Every day | ORAL | 1 refills | Status: DC

## 2019-05-13 MED ORDER — ALUM & MAG HYDROXIDE-SIMETH 200-200-20 MG/5ML PO SUSP
30.0000 mL | Freq: Once | ORAL | Status: AC
  Administered 2019-05-13: 30 mL via ORAL
  Filled 2019-05-13: qty 30

## 2019-05-13 MED ORDER — LIDOCAINE VISCOUS HCL 2 % MT SOLN
15.0000 mL | Freq: Once | OROMUCOSAL | Status: AC
  Administered 2019-05-13: 15 mL via ORAL
  Filled 2019-05-13: qty 15

## 2019-05-13 MED ORDER — SUCRALFATE 1 G PO TABS: 1.0000 g | ORAL_TABLET | Freq: Four times a day (QID) | ORAL | 0 refills | Status: DC

## 2019-05-13 NOTE — ED Notes (Signed)
Pt alert and oriented X 4, stable for discharge. RR even and unlabored, color WNL. Discussed discharge instructions and follow up when appropriate. Instructed to follow up with ER for any life threatening symptoms or concerns that patient or family of patient may have  

## 2019-05-13 NOTE — ED Provider Notes (Signed)
[facility] Emergency Department Provider Note   ____________________________________________    I have reviewed the triage vital signs and the nursing notes.   HISTORY  Chief Complaint Abdominal Pain     HPI Devon Jackson is a 53 y.o. male who presents with complaints of epigastric pain.  Patient reports this is been ongoing for "months ".  Seems to be more frequent over the last week or 2.  His wife insisted that he come get checked out.  He reports the pain is epigastric in nature frequently burning, does not extend to the chest.  No real radiation.  Is not taken anything for this.  No fevers or chills.  Occasional nausea no vomiting.  Normal stools.  No history of abdominal surgery besides an appendectomy  Past Medical History:  Diagnosis Date  . Allergic rhinitis   . Frequent headaches   . Gout   . Hypertriglyceridemia   . Type 2 diabetes mellitus [facility])     Patient Active Problem List   Diagnosis Date Noted  . Bee sting allergy 11/05/2018  . GERD (gastroesophageal reflux disease) 06/23/2017  . Irregular heart rhythm 06/23/2017  . OSA (obstructive sleep apnea) 06/23/2017  . Trigger finger 12/13/2016  . Preventative health care 11/26/2015  . Type 2 diabetes mellitus ([place]) 09/23/2015  . Frequent headaches 09/23/2015  . Hyperlipidemia 09/23/2015  . Allergic rhinitis 09/23/2015    Past Surgical History:  Procedure Laterality Date  . APPENDECTOMY    . POLYPECTOMY    . ROTATOR CUFF REPAIR  2009  . SEPTOPLASTY      Prior to Admission medications   Medication Sig Start Date End Date Taking? Authorizing Provider  albuterol (PROAIR HFA) 108 (90 Base) MCG/ACT inhaler Inhale 2 puffs into the lungs every 6 (six) hours as needed. 12/15/15   Pleas Koch, NP  BD PEN NEEDLE NANO U/F 32G X 4 MM MISC USE TO CHECK BLOOD SUGAR AS DIRECTED 11/24/16   Pleas Koch, NP  blood glucose meter kit and supplies KIT Dispense based on patient and  insurance preference. Use up to 3 times daily as directed. (Dx is E11.9). 11/25/15   Pleas Koch, NP  clotrimazole-betamethasone (LOTRISONE) cream Apply topically.    [provider]  cyclobenzaprine (FLEXERIL) 10 MG tablet Take 1 tablet by mouth once to twice daily as needed for severe headaches. 12/22/17   Pleas Koch, NP  DEXILANT 60 MG capsule Take 1 capsule (60 mg total) by mouth daily. For heartburn. 05/08/19   Pleas Koch, NP  EPINEPHrine (EPIPEN 2-PAK) 0.3 mg/0.3 mL IJ SOAJ injection Inject 0.3 mLs (0.3 mg total) into the muscle as needed for anaphylaxis. 11/05/18   Pleas Koch, NP  FREESTYLE LITE test strip USE UP TO THREE TIMES A DAY AS DIRECTED 05/01/17   Pleas Koch, NP  Lancets (FREESTYLE) lancets USE TO TEST BLOOD SUGAR 2 TO 3 TIMES DAILY AND AS DIRECTED 06/12/17   Pleas Koch, NP  LEVEMIR FLEXTOUCH 100 UNIT/ML Pen INJECT 45 UNITS UNDER THE SKIN AT BEDTIME 03/27/19   Pleas Koch, NP  meloxicam (MOBIC) 15 MG tablet TK 1 T PO QD WC 12/12/17   [provider]  metFORMIN (GLUCOPHAGE) 1000 MG tablet TAKE 1 TABLET TWICE A DAY WITH MEALS FOR DIABETES 03/04/19   Pleas Koch, NP  montelukast (SINGULAIR) 10 MG tablet TAKE 1 TABLET AT BEDTIME 11/14/18   Pleas Koch, NP  pantoprazole (PROTONIX) 20 MG tablet  Take 1 tablet (20 mg total) by mouth daily. 05/13/19 05/12/20  Lavonia Drafts, MD  pravastatin (PRAVACHOL) 40 MG tablet Take 1 tablet (40 mg total) by mouth daily. For cholesterol. 11/29/18   Pleas Koch, NP  sucralfate (CARAFATE) 1 g tablet Take 1 tablet (1 g total) by mouth 4 (four) times daily for 15 days. 05/13/19 05/28/19  Lavonia Drafts, MD  tadalafil (CIALIS) 20 MG tablet TAKE 1 TABLET 30 MINUTES PRIOR TO INTERCOURSE AS NEEDED 04/16/19   Pleas Koch, NP     Allergies Atorvastatin  Family History  Problem Relation Age of Onset  . Alcohol abuse Mother   . Diabetes Father   . Breast cancer Paternal  Grandmother   . Lung cancer Paternal Grandfather     Social History Social History   Tobacco Use  . Smoking status: Never Smoker  . Smokeless tobacco: Never Used  Substance Use Topics  . Alcohol use: Yes    Alcohol/week: 0.0 standard drinks    Comment: rarely  . Drug use: No    Review of Systems  Constitutional: No fever/chills Eyes: No visual changes.  ENT: No sore throat. Cardiovascular: Denies chest pain. Respiratory: No cough Gastrointestinal: As above Genitourinary: Negative for dysuria. Musculoskeletal: Negative for back pain. Skin: Negative for rash. Neurological: Negative for headaches    ____________________________________________   PHYSICAL EXAM:  VITAL SIGNS: ED Triage Vitals  Enc Vitals Group     BP 05/13/19 1040 (!) 156/81     Pulse Rate 05/13/19 1040 89     Resp 05/13/19 1040 18     Temp 05/13/19 1040 98.5 F (36.9 C)     Temp Source 05/13/19 1040 Oral     SpO2 05/13/19 1040 100 %     Weight 05/13/19 1041 106.6 kg (235 lb)     Height 05/13/19 1041 1.753 m (_0 )     Head Circumference --      Peak Flow --      Pain Score 05/13/19 1041 4     Pain Loc --      Pain Edu? --      Excl. in [place]? --     Constitutional: Alert and oriented.   Mouth/Throat: Mucous membranes are moist.    Cardiovascular: Normal rate, regular rhythm. Grossly normal heart sounds.  Good peripheral circulation. Respiratory: Normal respiratory effort.  No retractions. Lungs CTAB. Gastrointestinal: Mild epigastric tenderness.  No distention.  No CVA tenderness.  Musculoskeletal:  Warm and well perfused Neurologic:  Normal speech and language. No gross focal neurologic deficits are appreciated.  Skin:  Skin is warm, dry and intact. No rash noted. Psychiatric: Mood and affect are normal. Speech and behavior are normal.  ____________________________________________   LABS (all labs ordered are listed, but only abnormal results are displayed)  Labs Reviewed  BASIC  METABOLIC PANEL - Abnormal; Notable for the following components:      Result Value   Glucose, Bld 168 (*)    All other components within normal limits  CBC  TROPONIN I (HIGH SENSITIVITY)   ____________________________________________  EKG  ED ECG REPORT I, Lavonia Drafts, the attending physician, personally viewed and interpreted this ECG.  Date: 05/13/2019  Rhythm: normal sinus rhythm QRS Axis: normal Intervals: normal ST/T Wave abnormalities: normal Narrative Interpretation: no evidence of acute ischemia  ____________________________________________  RADIOLOGY  Chest x-ray unremarkable Ultrasound shows fatty liver, discussed with patient, also numerous gallstones but no cholecystitis ____________________________________________   PROCEDURES  Procedure(s) performed: No  Procedures  Critical Care performed: No ____________________________________________   INITIAL IMPRESSION / ASSESSMENT AND PLAN / ED COURSE  Pertinent labs & imaging results that were available during my care of the patient were reviewed by me and considered in my medical decision making (see chart for details).  Patient presents with epigastric pain as described above over the course of months, strongly suspicious for gastritis/PUD.  Less likely biliary colic.  Lab work is overall reassuring, ultrasound obtained which shows gallstones, fatty liver. discussed these results with patient.   Patient require follow-up with GI for evaluation of gastritis/PUD, likely require endoscopy.  We will start him on PPI, Carafate for now, he has a gastroenterologist in [place] that he is seen before     ____________________________________________   FINAL CLINICAL IMPRESSION(S) / ED DIAGNOSES  Final diagnoses:  Epigastric pain  Gastritis without bleeding, unspecified chronicity, unspecified gastritis type  Fatty liver  Gallstones        Note:  This document was prepared using Dragon voice  recognition software and may include unintentional dictation errors.   Lavonia Drafts, MD 05/13/19 [phone]

## 2019-05-13 NOTE — ED Triage Notes (Signed)
Reports epigastric pain X 1 months, denies NVD or CP. Reports nausea only after eating. Pt alert and oriented X4, cooperative, RR even and unlabored, color WNL. Pt in NAD.

## 2019-05-13 NOTE — ED Notes (Signed)
EDP at bedside at this time.  

## 2019-05-15 ENCOUNTER — Ambulatory Visit: Admitting: Primary Care

## 2019-06-15 DIAGNOSIS — R519 Headache, unspecified: Secondary | ICD-10-CM

## 2019-06-17 MED ORDER — CLONAZEPAM 1 MG PO TABS: ORAL_TABLET | ORAL | 0 refills | Status: DC

## 2019-06-24 ENCOUNTER — Other Ambulatory Visit: Payer: Self-pay | Admitting: Primary Care

## 2019-06-24 DIAGNOSIS — N529 Male erectile dysfunction, unspecified: Secondary | ICD-10-CM

## 2019-08-29 ENCOUNTER — Other Ambulatory Visit: Payer: Self-pay | Admitting: Primary Care

## 2019-08-29 DIAGNOSIS — E119 Type 2 diabetes mellitus without complications: Secondary | ICD-10-CM

## 2019-08-29 DIAGNOSIS — Z794 Long term (current) use of insulin: Secondary | ICD-10-CM

## 2019-09-02 ENCOUNTER — Other Ambulatory Visit: Payer: Self-pay | Admitting: Primary Care

## 2019-09-02 DIAGNOSIS — N529 Male erectile dysfunction, unspecified: Secondary | ICD-10-CM

## 2019-10-04 ENCOUNTER — Encounter: Payer: Self-pay | Admitting: Primary Care

## 2019-10-04 ENCOUNTER — Other Ambulatory Visit: Payer: Self-pay

## 2019-10-04 ENCOUNTER — Ambulatory Visit (INDEPENDENT_AMBULATORY_CARE_PROVIDER_SITE_OTHER): Admitting: Primary Care

## 2019-10-04 VITALS — BP 136/86 | HR 61 | Temp 96.7°F | Ht 69.0 in | Wt 246.5 lb

## 2019-10-04 DIAGNOSIS — R519 Headache, unspecified: Secondary | ICD-10-CM | POA: Diagnosis not present

## 2019-10-04 DIAGNOSIS — E782 Mixed hyperlipidemia: Secondary | ICD-10-CM | POA: Diagnosis not present

## 2019-10-04 DIAGNOSIS — F320 Major depressive disorder, single episode, mild: Secondary | ICD-10-CM | POA: Diagnosis not present

## 2019-10-04 MED ORDER — BUPROPION HCL ER (XL) 150 MG PO TB24: 150.0000 mg | ORAL_TABLET | Freq: Every day | ORAL | 0 refills | Status: DC

## 2019-10-04 MED ORDER — CYCLOBENZAPRINE HCL 10 MG PO TABS: ORAL_TABLET | ORAL | 0 refills | Status: DC

## 2019-10-04 NOTE — Patient Instructions (Addendum)
Start bupropion (Wellbutrin) XL 150 mg once daily for anxiety and depression.   Schedule a follow up visit with me in 6 weeks for depression and diabetes.  It was a pleasure to see you today!

## 2019-10-04 NOTE — Progress Notes (Signed)
Subjective:    Patient ID: Devon Jackson, male    DOB: [birth year], 54 y.o.   MRN: [id number]  HPI  This visit occurred during the SARS-CoV-2 public health emergency.  Safety protocols were in place, including screening questions prior to the visit, additional usage of staff PPE, and extensive cleaning of exam room while observing appropriate contact time as indicated for disinfecting solutions.   Devon Jackson is a 54 year old male with a history of anxiety and depression, type 2 diabetes, frequent headaches, who presents today with a chief complaint of anxiety and depression.   Prior history of anxiety that dates back years ago, typically able to manage symptoms on his own now as they are quite mild.  He was once managed on Wellbutrin and clonazepam during a divorce about 15 years ago, did well on that regimen.  Has not had treatment since then.  He does have a prescription for clonazepam for which he uses very sparingly.   Over the last 2-3 months he's noticing symptoms that feel like his prior anxiety and depression including tingling to his chest, tearfulness, feels like he's "done something wrong", difficulty staying asleep. GAD 7 score of 3 and PHQ 9 score of 4 today. Denies SI/HI.   His most bothersome symptom is tearfulness and difficulty sleeping. He was once managed on Ambien which didn't work well.  He would like to resume his Wellbutrin as this was effective for the symptoms in the past.  Review of Systems  Respiratory: Negative for shortness of breath.   Cardiovascular: Negative for chest pain.  Psychiatric/Behavioral: Positive for sleep disturbance. The patient is nervous/anxious.        See HPI       Past Medical History:  Diagnosis Date  . Allergic rhinitis   . Frequent headaches   . Gout   . Hypertriglyceridemia   . Type 2 diabetes mellitus ([place])      Social History   Socioeconomic History  . Marital status: Married    Spouse name: Not on file  . Number of  children: Not on file  . Years of education: Not on file  . Highest education level: Not on file  Occupational History  . Not on file  Tobacco Use  . Smoking status: Never Smoker  . Smokeless tobacco: Never Used  Substance and Sexual Activity  . Alcohol use: Yes    Alcohol/week: 0.0 standard drinks    Comment: rarely  . Drug use: No  . Sexual activity: Not on file  Other Topics Concern  . Not on file  Social History Narrative   Married.   3 children.   Works as a Therapist, nutritional.   Enjoys relaxing, spending time outdoors.   Social Determinants of Health   Financial Resource Strain:   . Difficulty of Paying Living Expenses:   Food Insecurity:   . Worried About Charity fundraiser in the Last Year:   . Arboriculturist in the Last Year:   Transportation Needs:   . Film/video editor (Medical):   Marland Kitchen Lack of Transportation (Non-Medical):   Physical Activity:   . Days of Exercise per Week:   . Minutes of Exercise per Session:   Stress:   . Feeling of Stress :   Social Connections:   . Frequency of Communication with Friends and Family:   . Frequency of Social Gatherings with Friends and Family:   . Attends Religious Services:   . Active Member of Clubs  or Organizations:   . Attends Archivist Meetings:   Marland Kitchen Marital Status:   Intimate Partner Violence:   . Fear of Current or Ex-Partner:   . Emotionally Abused:   Marland Kitchen Physically Abused:   . Sexually Abused:     Past Surgical History:  Procedure Laterality Date  . APPENDECTOMY    . POLYPECTOMY    . ROTATOR CUFF REPAIR  2009  . SEPTOPLASTY      Family History  Problem Relation Age of Onset  . Alcohol abuse Mother   . Diabetes Father   . Breast cancer Paternal Grandmother   . Lung cancer Paternal Grandfather     Allergies  Allergen Reactions  . Atorvastatin     Other reaction(s): Myalgias (intolerance)    Current Outpatient Medications on File Prior to Visit  Medication Sig Dispense Refill  .  albuterol (PROAIR HFA) 108 (90 Base) MCG/ACT inhaler Inhale 2 puffs into the lungs every 6 (six) hours as needed. 24 g 4  . BD PEN NEEDLE NANO U/F 32G X 4 MM MISC USE TO CHECK BLOOD SUGAR AS DIRECTED 300 each 4  . blood glucose meter kit and supplies KIT Dispense based on patient and insurance preference. Use up to 3 times daily as directed. (Dx is E11.9). 1 each 0  . clonazePAM (KLONOPIN) 1 MG tablet Take 1 tablet by mouth once daily as needed for severe headache. 10 tablet 0  . clotrimazole-betamethasone (LOTRISONE) cream Apply topically.    Marland Kitchen DEXILANT 60 MG capsule Take 1 capsule (60 mg total) by mouth daily. For heartburn. 90 capsule 0  . EPINEPHrine (EPIPEN 2-PAK) 0.3 mg/0.3 mL IJ SOAJ injection Inject 0.3 mLs (0.3 mg total) into the muscle as needed for anaphylaxis. 2 Device 0  . FREESTYLE LITE test strip USE UP TO THREE TIMES A DAY AS DIRECTED 300 each 1  . Lancets (FREESTYLE) lancets USE TO TEST BLOOD SUGAR 2 TO 3 TIMES DAILY AND AS DIRECTED 300 each 1  . LEVEMIR FLEXTOUCH 100 UNIT/ML Pen INJECT 45 UNITS UNDER THE SKIN AT BEDTIME 45 mL 3  . meloxicam (MOBIC) 15 MG tablet TK 1 T PO QD WC  3  . metFORMIN (GLUCOPHAGE) 1000 MG tablet TAKE 1 TABLET TWICE A DAY WITH MEALS FOR DIABETES 180 tablet 1  . montelukast (SINGULAIR) 10 MG tablet TAKE 1 TABLET AT BEDTIME 90 tablet 3  . pantoprazole (PROTONIX) 20 MG tablet Take 1 tablet (20 mg total) by mouth daily. 30 tablet 1  . pravastatin (PRAVACHOL) 40 MG tablet Take 1 tablet (40 mg total) by mouth daily. For cholesterol. 90 tablet 1  . tadalafil (CIALIS) 20 MG tablet TAKE 1 TABLET 30 MINUTES PRIOR TO INTERCOURSE AS NEEDED 21 tablet 0  . sucralfate (CARAFATE) 1 g tablet Take 1 tablet (1 g total) by mouth 4 (four) times daily for 15 days. 60 tablet 0   No current facility-administered medications on file prior to visit.    BP 136/86   Pulse 61   Temp (!) 96.7 F (35.9 C) (Temporal)   Ht _0  (1.753 m)   Wt 246 lb 8 oz (111.8 kg)   SpO2 97%    BMI 36.40 kg/m    Objective:   Physical Exam  Constitutional: He appears well-nourished.  Cardiovascular: Normal rate and regular rhythm.  Respiratory: Effort normal and breath sounds normal.  Musculoskeletal:     Cervical back: Neck supple.  Skin: Skin is warm and dry.  Psychiatric: He has a normal  mood and affect.           Assessment & Plan:

## 2019-10-05 DIAGNOSIS — F320 Major depressive disorder, single episode, mild: Secondary | ICD-10-CM | POA: Insufficient documentation

## 2019-10-05 DIAGNOSIS — F329 Major depressive disorder, single episode, unspecified: Secondary | ICD-10-CM | POA: Insufficient documentation

## 2019-10-05 NOTE — Assessment & Plan Note (Signed)
Could not tolerate pravastatin, caused memory problems.  Will discuss Zetia at next visit.

## 2019-10-05 NOTE — Assessment & Plan Note (Signed)
Chronic for years, recently resurfaced. Given that he has done well on Wellbutrin in the past we will reinitiate.  I strongly discouraged use of clonazepam.  We will catch back up in 4 to 6 weeks.

## 2019-10-16 ENCOUNTER — Ambulatory Visit (INDEPENDENT_AMBULATORY_CARE_PROVIDER_SITE_OTHER): Admitting: Primary Care

## 2019-10-16 ENCOUNTER — Other Ambulatory Visit: Payer: Self-pay

## 2019-10-16 VITALS — BP 150/84 | HR 80 | Temp 98.0°F | Resp 20 | Wt 248.2 lb

## 2019-10-16 DIAGNOSIS — G4700 Insomnia, unspecified: Secondary | ICD-10-CM

## 2019-10-16 DIAGNOSIS — F320 Major depressive disorder, single episode, mild: Secondary | ICD-10-CM | POA: Diagnosis not present

## 2019-10-16 MED ORDER — TRAZODONE HCL 50 MG PO TABS: 50.0000 mg | ORAL_TABLET | Freq: Every evening | ORAL | 0 refills | Status: DC | PRN

## 2019-10-16 NOTE — Progress Notes (Signed)
Subjective:    Patient ID: Devon Jackson, male    DOB: [birth year], 54 y.o.   MRN: [id number]  HPI  Mr. Devon Jackson is a 54 year old male with a history of OSA, GERD, type 2 diabetes, hyperlipidemia, depression who presents today with a chief complaint of insomnia.   He was last evaluated on 10/04/19 with reports of increased symptoms of depression. He had done well on Wellbutrin in the past and wanted to restart.   Since his last visit he's quit his stressful job, feels good about this as a lot of the stress has lifted. He is compliant to his Wellbutrin daily, no problems thus far.  He has difficulty staying asleep, will wake up 4-5 hours later and will be wide awake. This has been chronic for months. He's tried Benadryl, Trazodone, and Ambien in the past but this was years ago. He thinks that Trazodone was ineffective, also caused cotton mouth. Ambien was ineffective. He's also tried Melatonin which didn't help. He is compliant to his CPAP nightly.   Review of Systems  Respiratory: Negative for shortness of breath.   Cardiovascular: Negative for chest pain.  Psychiatric/Behavioral: Positive for sleep disturbance. The patient is not nervous/anxious.        Past Medical History:  Diagnosis Date  . Allergic rhinitis   . Frequent headaches   . Gout   . Hypertriglyceridemia   . Type 2 diabetes mellitus ([place])      Social History   Socioeconomic History  . Marital status: Married    Spouse name: Not on file  . Number of children: Not on file  . Years of education: Not on file  . Highest education level: Not on file  Occupational History  . Not on file  Tobacco Use  . Smoking status: Never Smoker  . Smokeless tobacco: Never Used  Substance and Sexual Activity  . Alcohol use: Yes    Alcohol/week: 0.0 standard drinks    Comment: rarely  . Drug use: No  . Sexual activity: Not on file  Other Topics Concern  . Not on file  Social History Narrative   Married.   3 children.   Works as a Therapist, nutritional.   Enjoys relaxing, spending time outdoors.   Social Determinants of Health   Financial Resource Strain:   . Difficulty of Paying Living Expenses:   Food Insecurity:   . Worried About Charity fundraiser in the Last Year:   . Arboriculturist in the Last Year:   Transportation Needs:   . Film/video editor (Medical):   Marland Kitchen Lack of Transportation (Non-Medical):   Physical Activity:   . Days of Exercise per Week:   . Minutes of Exercise per Session:   Stress:   . Feeling of Stress :   Social Connections:   . Frequency of Communication with Friends and Family:   . Frequency of Social Gatherings with Friends and Family:   . Attends Religious Services:   . Active Member of Clubs or Organizations:   . Attends Archivist Meetings:   Marland Kitchen Marital Status:   Intimate Partner Violence:   . Fear of Current or Ex-Partner:   . Emotionally Abused:   Marland Kitchen Physically Abused:   . Sexually Abused:     Past Surgical History:  Procedure Laterality Date  . APPENDECTOMY    . POLYPECTOMY    . ROTATOR CUFF REPAIR  2009  . SEPTOPLASTY      Family History  Problem Relation Age of Onset  . Alcohol abuse Mother   . Diabetes Father   . Breast cancer Paternal Grandmother   . Lung cancer Paternal Grandfather     Allergies  Allergen Reactions  . Statins Other (See Comments)    Memory issues    Current Outpatient Medications on File Prior to Visit  Medication Sig Dispense Refill  . albuterol (PROAIR HFA) 108 (90 Base) MCG/ACT inhaler Inhale 2 puffs into the lungs every 6 (six) hours as needed. 24 g 4  . BD PEN NEEDLE NANO U/F 32G X 4 MM MISC USE TO CHECK BLOOD SUGAR AS DIRECTED 300 each 4  . blood glucose meter kit and supplies KIT Dispense based on patient and insurance preference. Use up to 3 times daily as directed. (Dx is E11.9). 1 each 0  . buPROPion (WELLBUTRIN XL) 150 MG 24 hr tablet Take 1 tablet (150 mg total) by mouth daily. For depression. 90 tablet  0  . clonazePAM (KLONOPIN) 1 MG tablet Take 1 tablet by mouth once daily as needed for severe headache. 10 tablet 0  . clotrimazole-betamethasone (LOTRISONE) cream Apply topically.    . cyclobenzaprine (FLEXERIL) 10 MG tablet Take 1 tablet by mouth once to twice daily as needed for severe headaches. 30 tablet 0  . DEXILANT 60 MG capsule Take 1 capsule (60 mg total) by mouth daily. For heartburn. 90 capsule 0  . EPINEPHrine (EPIPEN 2-PAK) 0.3 mg/0.3 mL IJ SOAJ injection Inject 0.3 mLs (0.3 mg total) into the muscle as needed for anaphylaxis. 2 Device 0  . FREESTYLE LITE test strip USE UP TO THREE TIMES A DAY AS DIRECTED 300 each 1  . Lancets (FREESTYLE) lancets USE TO TEST BLOOD SUGAR 2 TO 3 TIMES DAILY AND AS DIRECTED 300 each 1  . LEVEMIR FLEXTOUCH 100 UNIT/ML Pen INJECT 45 UNITS UNDER THE SKIN AT BEDTIME 45 mL 3  . meloxicam (MOBIC) 15 MG tablet TK 1 T PO QD WC  3  . metFORMIN (GLUCOPHAGE) 1000 MG tablet TAKE 1 TABLET TWICE A DAY WITH MEALS FOR DIABETES 180 tablet 1  . montelukast (SINGULAIR) 10 MG tablet TAKE 1 TABLET AT BEDTIME 90 tablet 3  . tadalafil (CIALIS) 20 MG tablet TAKE 1 TABLET 30 MINUTES PRIOR TO INTERCOURSE AS NEEDED 21 tablet 0   No current facility-administered medications on file prior to visit.    BP (!) 150/84   Pulse 80   Temp 98 F (36.7 C)   Resp 20   Wt 248 lb 4 oz (112.6 kg)   BMI 36.66 kg/m    Objective:   Physical Exam  Constitutional: He appears well-nourished.  Cardiovascular: Normal rate and regular rhythm.  Respiratory: Effort normal and breath sounds normal.  Musculoskeletal:     Cervical back: Neck supple.  Skin: Skin is warm and dry.  Psychiatric: He has a normal mood and affect.           Assessment & Plan:

## 2019-10-16 NOTE — Assessment & Plan Note (Signed)
Starting to improve, largely due to quitting his very stressful job. Continue Wellbutrin.

## 2019-10-16 NOTE — Patient Instructions (Signed)
Start Trazodone 50 mg tablets for sleep. Take 1 tablet about 30 minutes prior to bedtime. May increase to 2 tablets in 2-3 days if no improvement.   Please update me via my chart in 2 weeks as discussed.  It was a pleasure to see you today!

## 2019-10-16 NOTE — Assessment & Plan Note (Signed)
Chronic for years, intermittent. More frequent recently, maintenance insomnia.  Discussed options for treatment, would like to avoid Ambien if possible. Rx for Trazodone 50 mg sent to pharmacy. Discussed to increase to 2 tablets after a few nights if needed.  Consider gabapentin, hydroxyzine.  He will update via my chart in 1-2 weeks.

## 2019-11-08 DIAGNOSIS — G4700 Insomnia, unspecified: Secondary | ICD-10-CM

## 2019-11-08 DIAGNOSIS — F320 Major depressive disorder, single episode, mild: Secondary | ICD-10-CM

## 2019-11-09 ENCOUNTER — Other Ambulatory Visit: Payer: Self-pay | Admitting: Primary Care

## 2019-11-09 DIAGNOSIS — J309 Allergic rhinitis, unspecified: Secondary | ICD-10-CM

## 2019-11-10 MED ORDER — TRAZODONE HCL 50 MG PO TABS: 50.0000 mg | ORAL_TABLET | Freq: Every evening | ORAL | 0 refills | Status: DC | PRN

## 2019-11-10 MED ORDER — BUPROPION HCL ER (XL) 150 MG PO TB24: 150.0000 mg | ORAL_TABLET | Freq: Every day | ORAL | 0 refills | Status: DC

## 2019-11-15 ENCOUNTER — Other Ambulatory Visit: Payer: Self-pay

## 2019-11-15 ENCOUNTER — Encounter: Payer: Self-pay | Admitting: Primary Care

## 2019-11-15 ENCOUNTER — Ambulatory Visit (INDEPENDENT_AMBULATORY_CARE_PROVIDER_SITE_OTHER): Admitting: Primary Care

## 2019-11-15 VITALS — BP 120/82 | HR 80 | Temp 96.5°F | Ht 69.0 in | Wt 249.0 lb

## 2019-11-15 DIAGNOSIS — G4733 Obstructive sleep apnea (adult) (pediatric): Secondary | ICD-10-CM | POA: Diagnosis not present

## 2019-11-15 DIAGNOSIS — Z794 Long term (current) use of insulin: Secondary | ICD-10-CM | POA: Diagnosis not present

## 2019-11-15 DIAGNOSIS — E119 Type 2 diabetes mellitus without complications: Secondary | ICD-10-CM | POA: Diagnosis not present

## 2019-11-15 DIAGNOSIS — N529 Male erectile dysfunction, unspecified: Secondary | ICD-10-CM | POA: Insufficient documentation

## 2019-11-15 DIAGNOSIS — E782 Mixed hyperlipidemia: Secondary | ICD-10-CM

## 2019-11-15 DIAGNOSIS — K219 Gastro-esophageal reflux disease without esophagitis: Secondary | ICD-10-CM

## 2019-11-15 DIAGNOSIS — R519 Headache, unspecified: Secondary | ICD-10-CM

## 2019-11-15 DIAGNOSIS — J309 Allergic rhinitis, unspecified: Secondary | ICD-10-CM

## 2019-11-15 DIAGNOSIS — F320 Major depressive disorder, single episode, mild: Secondary | ICD-10-CM

## 2019-11-15 DIAGNOSIS — G4700 Insomnia, unspecified: Secondary | ICD-10-CM

## 2019-11-15 LAB — POCT GLYCOSYLATED HEMOGLOBIN (HGB A1C): Hemoglobin A1C: 7.5 % — AB (ref 4.0–5.6)

## 2019-11-15 NOTE — Assessment & Plan Note (Signed)
Infrequent, no recent use of cyclobenzaprine. Continue to monitor.

## 2019-11-15 NOTE — Assessment & Plan Note (Signed)
Infrequent symptoms, using Dexilant PRN. Continue same.

## 2019-11-15 NOTE — Assessment & Plan Note (Signed)
Overall okay on Cialis 20 mg PRN.  Continue same.

## 2019-11-15 NOTE — Assessment & Plan Note (Signed)
Cannot tolerate any statin therapy. Repeat lipids pending. Consider Zetia.

## 2019-11-15 NOTE — Patient Instructions (Signed)
Start checking your blood sugar levels at least once daily. Appropriate times to check your blood sugar levels are:  -Before any meal (breakfast, lunch, dinner) -Two hours after any meal (breakfast, lunch, dinner) -Bedtime  Record your readings and notify me if you continue to consistently run at or above 200.  Start exercising. You should be getting 150 minutes of moderate intensity exercise weekly.  It's important to improve your diet by reducing consumption of fast food, fried food, processed snack foods, sugary drinks. Increase consumption of fresh vegetables and fruits, whole grains, water.  Ensure you are drinking 64 ounces of water daily.  Please schedule a follow up appointment in 6 months for diabetes check.   It was a pleasure to see you today!

## 2019-11-15 NOTE — Assessment & Plan Note (Signed)
Improved with Wellbutrin, continue same.

## 2019-11-15 NOTE — Assessment & Plan Note (Signed)
Doing well on Singulair, continue same.

## 2019-11-15 NOTE — Assessment & Plan Note (Signed)
Improved with Trazodone, continue same.

## 2019-11-15 NOTE — Assessment & Plan Note (Signed)
Increased A1C of 7.5 from last visit.   Discussed the absolute need to check blood sugars when on insulin. He will start checking.  Discussed the importance of weight loss, work on diet.  Foot exam today. Eye exam UTD. Pneumonia vaccination UTD Cannot tolerate statin therapy. Repeat lipids pending, consider Zetia if needed. Urine microalbumin negative in September 2020.  Continue Levemir 55 units for now. Continue Metformin 1000 BID.  He will update if glucose readings increase to 150 or higher.  Follow up in 6 months.

## 2019-11-15 NOTE — Assessment & Plan Note (Signed)
Compliant to CPAP nightly, continue same.  

## 2019-11-15 NOTE — Progress Notes (Signed)
Subjective:    Patient ID: Devon Jackson, male    DOB: [birth year], 54 y.o.   MRN: [id number]  HPI  This visit occurred during the SARS-CoV-2 public health emergency.  Safety protocols were in place, including screening questions prior to the visit, additional usage of staff PPE, and extensive cleaning of exam room while observing appropriate contact time as indicated for disinfecting solutions.   Devon Jackson is a 54 year old male with a history of OSA, type 2 diabetes, hyperlipidemia, insomnia who presents today for follow up.  1) Type 2 Diabetes:  Current medications include: Levemir 55 units HS and Metformin 1000 mg BID.   He is not checking his blood sugars.   Last A1C: 6.9 in September 2020, 7.5 today Last Eye Exam: UTD Last Foot Exam: Due Pneumonia Vaccination: Completed in 2017 ACE/ARB: None. Urine micro albumin negative in September 2020 Statin: none. Cannot tolerate.   2) Insomnia/Anxiety: Currently managed on Wellbutrin XL 150 mg which was initiated about 6 weeks ago for anxiety. At the time he was working in a very stressful environment and suspected symptoms to be secondary.   Since his initial visit 6 weeks ago he is doing much better.   3) Headaches: Overall improved since he quit his stressful job. No recent use of cyclobenzaprine for which he uses sparingly.   BP Readings from Last 3 Encounters:  11/15/19 120/82  10/16/19 (!) 150/84  10/04/19 136/86     Review of Systems  Eyes: Negative for visual disturbance.  Respiratory: Negative for shortness of breath.   Cardiovascular: Negative for chest pain.  Neurological: Negative for dizziness and headaches.  Psychiatric/Behavioral: The patient is not nervous/anxious.        Sleeping better       Past Medical History:  Diagnosis Date  . Allergic rhinitis   . Frequent headaches   . Gout   . Hypertriglyceridemia   . Type 2 diabetes mellitus ([place])      Social History   Socioeconomic History  . Marital  status: Married    Spouse name: Not on file  . Number of children: Not on file  . Years of education: Not on file  . Highest education level: Not on file  Occupational History  . Not on file  Tobacco Use  . Smoking status: Never Smoker  . Smokeless tobacco: Never Used  Substance and Sexual Activity  . Alcohol use: Yes    Alcohol/week: 0.0 standard drinks    Comment: rarely  . Drug use: No  . Sexual activity: Not on file  Other Topics Concern  . Not on file  Social History Narrative   Married.   3 children.   Works as a Therapist, nutritional.   Enjoys relaxing, spending time outdoors.   Social Determinants of Health   Financial Resource Strain:   . Difficulty of Paying Living Expenses:   Food Insecurity:   . Worried About Charity fundraiser in the Last Year:   . Arboriculturist in the Last Year:   Transportation Needs:   . Film/video editor (Medical):   Marland Kitchen Lack of Transportation (Non-Medical):   Physical Activity:   . Days of Exercise per Week:   . Minutes of Exercise per Session:   Stress:   . Feeling of Stress :   Social Connections:   . Frequency of Communication with Friends and Family:   . Frequency of Social Gatherings with Friends and Family:   . Attends Religious Services:   .  Active Member of Clubs or Organizations:   . Attends Archivist Meetings:   Marland Kitchen Marital Status:   Intimate Partner Violence:   . Fear of Current or Ex-Partner:   . Emotionally Abused:   Marland Kitchen Physically Abused:   . Sexually Abused:     Past Surgical History:  Procedure Laterality Date  . APPENDECTOMY    . POLYPECTOMY    . ROTATOR CUFF REPAIR  2009  . SEPTOPLASTY      Family History  Problem Relation Age of Onset  . Alcohol abuse Mother   . Diabetes Father   . Breast cancer Paternal Grandmother   . Lung cancer Paternal Grandfather     Allergies  Allergen Reactions  . Statins Other (See Comments)    Memory issues    Current Outpatient Medications on File Prior  to Visit  Medication Sig Dispense Refill  . albuterol (PROAIR HFA) 108 (90 Base) MCG/ACT inhaler Inhale 2 puffs into the lungs every 6 (six) hours as needed. 24 g 4  . BD PEN NEEDLE NANO U/F 32G X 4 MM MISC USE TO CHECK BLOOD SUGAR AS DIRECTED 300 each 4  . blood glucose meter kit and supplies KIT Dispense based on patient and insurance preference. Use up to 3 times daily as directed. (Dx is E11.9). 1 each 0  . buPROPion (WELLBUTRIN XL) 150 MG 24 hr tablet Take 1 tablet (150 mg total) by mouth daily. For depression. 90 tablet 0  . clotrimazole-betamethasone (LOTRISONE) cream Apply topically.    . cyclobenzaprine (FLEXERIL) 10 MG tablet Take 1 tablet by mouth once to twice daily as needed for severe headaches. 30 tablet 0  . DEXILANT 60 MG capsule Take 1 capsule (60 mg total) by mouth daily. For heartburn. 90 capsule 0  . EPINEPHrine (EPIPEN 2-PAK) 0.3 mg/0.3 mL IJ SOAJ injection Inject 0.3 mLs (0.3 mg total) into the muscle as needed for anaphylaxis. 2 Device 0  . FREESTYLE LITE test strip USE UP TO THREE TIMES A DAY AS DIRECTED 300 each 1  . Lancets (FREESTYLE) lancets USE TO TEST BLOOD SUGAR 2 TO 3 TIMES DAILY AND AS DIRECTED 300 each 1  . LEVEMIR FLEXTOUCH 100 UNIT/ML Pen INJECT 45 UNITS UNDER THE SKIN AT BEDTIME 45 mL 3  . metFORMIN (GLUCOPHAGE) 1000 MG tablet TAKE 1 TABLET TWICE A DAY WITH MEALS FOR DIABETES 180 tablet 1  . montelukast (SINGULAIR) 10 MG tablet TAKE 1 TABLET AT BEDTIME 90 tablet 3  . tadalafil (CIALIS) 20 MG tablet TAKE 1 TABLET 30 MINUTES PRIOR TO INTERCOURSE AS NEEDED 21 tablet 0  . traZODone (DESYREL) 50 MG tablet Take 1 tablet (50 mg total) by mouth at bedtime as needed for sleep. 90 tablet 0   No current facility-administered medications on file prior to visit.    BP 120/82   Pulse 80   Temp (!) 96.5 F (35.8 C) (Temporal)   Ht 5' 9"  (1.753 m)   Wt 249 lb (112.9 kg)   SpO2 97%   BMI 36.77 kg/m    Objective:   Physical Exam  Constitutional: He appears  well-nourished.  Cardiovascular: Normal rate and regular rhythm.  Respiratory: Effort normal and breath sounds normal.  Musculoskeletal:     Cervical back: Neck supple.  Skin: Skin is warm and dry.  Psychiatric: He has a normal mood and affect.           Assessment & Plan:

## 2019-11-16 LAB — COMPREHENSIVE METABOLIC PANEL
AG Ratio: 2.1 (calc) (ref 1.0–2.5)
ALT: 19 U/L (ref 9–46)
AST: 13 U/L (ref 10–35)
Albumin: 4.4 g/dL (ref 3.6–5.1)
Alkaline phosphatase (APISO): 59 U/L (ref 35–144)
BUN: 12 mg/dL (ref 7–25)
CO2: 25 mmol/L (ref 20–32)
Calcium: 9.6 mg/dL (ref 8.6–10.3)
Chloride: 100 mmol/L (ref 98–110)
Creat: 0.84 mg/dL (ref 0.70–1.33)
Globulin: 2.1 g/dL (calc) (ref 1.9–3.7)
Glucose, Bld: 166 mg/dL — ABNORMAL HIGH (ref 65–99)
Potassium: 4.2 mmol/L (ref 3.5–5.3)
Sodium: 138 mmol/L (ref 135–146)
Total Bilirubin: 0.7 mg/dL (ref 0.2–1.2)
Total Protein: 6.5 g/dL (ref 6.1–8.1)

## 2019-11-16 LAB — LIPID PANEL
Cholesterol: 201 mg/dL — ABNORMAL HIGH (ref <200)
HDL: 32 mg/dL — ABNORMAL LOW (ref >=40)
Non-HDL Cholesterol (Calc): 169 mg/dL (calc) — ABNORMAL HIGH (ref <130)
Total CHOL/HDL Ratio: 6.3 (calc) — ABNORMAL HIGH (ref <5.0)
Triglycerides: 711 mg/dL — ABNORMAL HIGH (ref <150)

## 2019-11-16 LAB — PSA: PSA: 0.5 ng/mL (ref <=4.0)

## 2019-11-18 DIAGNOSIS — E782 Mixed hyperlipidemia: Secondary | ICD-10-CM

## 2019-11-18 MED ORDER — EZETIMIBE 10 MG PO TABS: 10.0000 mg | ORAL_TABLET | Freq: Every day | ORAL | 3 refills | Status: DC

## 2019-11-19 ENCOUNTER — Ambulatory Visit (INDEPENDENT_AMBULATORY_CARE_PROVIDER_SITE_OTHER): Admitting: Primary Care

## 2019-11-19 ENCOUNTER — Encounter: Payer: Self-pay | Admitting: Primary Care

## 2019-11-19 ENCOUNTER — Other Ambulatory Visit: Payer: Self-pay

## 2019-11-19 VITALS — BP 120/70 | HR 61 | Temp 96.2°F | Ht 69.0 in | Wt 247.8 lb

## 2019-11-19 DIAGNOSIS — L918 Other hypertrophic disorders of the skin: Secondary | ICD-10-CM

## 2019-11-19 DIAGNOSIS — D229 Melanocytic nevi, unspecified: Secondary | ICD-10-CM | POA: Diagnosis not present

## 2019-11-19 DIAGNOSIS — Z91030 Bee allergy status: Secondary | ICD-10-CM

## 2019-11-19 MED ORDER — EPINEPHRINE 0.3 MG/0.3ML IJ SOAJ: 0.3000 mg | INTRAMUSCULAR | 0 refills | Status: DC | PRN

## 2019-11-19 NOTE — Progress Notes (Signed)
Subjective:    Patient ID: Devon Jackson, male    DOB: 11/02/1965, 54 y.o.   MRN: [id number]  HPI  This visit occurred during the SARS-CoV-2 public health emergency.  Safety protocols were in place, including screening questions prior to the visit, additional usage of staff PPE, and extensive cleaning of exam room while observing appropriate contact time as indicated for disinfecting solutions.   Mr. Klemz is a 54 year old male with a history of type 2 diabetes, OSA, hyperlipidemia, allergic rhinitis who presents today for skin tag and nevus removal. He is also needing a refill of his Epi Pen which expires in June 2021.  His skin tag is located to the right mid thoracic back for which he's noticed for years. Over the time tag has increased in size and is now snagging on clothing. He has a nevus to the right lateral trunk which he noticed a few days ago. The nevus is a grey/scaly color that doesn't appear similar to his other nevi.   Review of Systems  Skin:       Nevus and skin tag       Past Medical History:  Diagnosis Date  . Allergic rhinitis   . Frequent headaches   . Gout   . Hypertriglyceridemia   . Type 2 diabetes mellitus ([place])      Social History   Socioeconomic History  . Marital status: Married    Spouse name: Not on file  . Number of children: Not on file  . Years of education: Not on file  . Highest education level: Not on file  Occupational History  . Not on file  Tobacco Use  . Smoking status: Never Smoker  . Smokeless tobacco: Never Used  Substance and Sexual Activity  . Alcohol use: Yes    Alcohol/week: 0.0 standard drinks    Comment: rarely  . Drug use: No  . Sexual activity: Not on file  Other Topics Concern  . Not on file  Social History Narrative   Married.   3 children.   Works as a Therapist, nutritional.   Enjoys relaxing, spending time outdoors.   Social Determinants of Health   Financial Resource Strain:   . Difficulty of Paying Living  Expenses:   Food Insecurity:   . Worried About Charity fundraiser in the Last Year:   . Arboriculturist in the Last Year:   Transportation Needs:   . Film/video editor (Medical):   Marland Kitchen Lack of Transportation (Non-Medical):   Physical Activity:   . Days of Exercise per Week:   . Minutes of Exercise per Session:   Stress:   . Feeling of Stress :   Social Connections:   . Frequency of Communication with Friends and Family:   . Frequency of Social Gatherings with Friends and Family:   . Attends Religious Services:   . Active Member of Clubs or Organizations:   . Attends Archivist Meetings:   Marland Kitchen Marital Status:   Intimate Partner Violence:   . Fear of Current or Ex-Partner:   . Emotionally Abused:   Marland Kitchen Physically Abused:   . Sexually Abused:     Past Surgical History:  Procedure Laterality Date  . APPENDECTOMY    . POLYPECTOMY    . ROTATOR CUFF REPAIR  2009  . SEPTOPLASTY      Family History  Problem Relation Age of Onset  . Alcohol abuse Mother   . Diabetes Father   .  Breast cancer Paternal Grandmother   . Lung cancer Paternal Grandfather     Allergies  Allergen Reactions  . Statins Other (See Comments)    Memory issues    Current Outpatient Medications on File Prior to Visit  Medication Sig Dispense Refill  . albuterol (PROAIR HFA) 108 (90 Base) MCG/ACT inhaler Inhale 2 puffs into the lungs every 6 (six) hours as needed. 24 g 4  . BD PEN NEEDLE NANO U/F 32G X 4 MM MISC USE TO CHECK BLOOD SUGAR AS DIRECTED 300 each 4  . blood glucose meter kit and supplies KIT Dispense based on patient and insurance preference. Use up to 3 times daily as directed. (Dx is E11.9). 1 each 0  . buPROPion (WELLBUTRIN XL) 150 MG 24 hr tablet Take 1 tablet (150 mg total) by mouth daily. For depression. 90 tablet 0  . clotrimazole-betamethasone (LOTRISONE) cream Apply topically.    . cyclobenzaprine (FLEXERIL) 10 MG tablet Take 1 tablet by mouth once to twice daily as needed  for severe headaches. 30 tablet 0  . DEXILANT 60 MG capsule Take 1 capsule (60 mg total) by mouth daily. For heartburn. 90 capsule 0  . EPINEPHrine (EPIPEN 2-PAK) 0.3 mg/0.3 mL IJ SOAJ injection Inject 0.3 mLs (0.3 mg total) into the muscle as needed for anaphylaxis. 2 Device 0  . ezetimibe (ZETIA) 10 MG tablet Take 1 tablet (10 mg total) by mouth daily. 90 tablet 3  . FREESTYLE LITE test strip USE UP TO THREE TIMES A DAY AS DIRECTED 300 each 1  . Lancets (FREESTYLE) lancets USE TO TEST BLOOD SUGAR 2 TO 3 TIMES DAILY AND AS DIRECTED 300 each 1  . LEVEMIR FLEXTOUCH 100 UNIT/ML Pen INJECT 45 UNITS UNDER THE SKIN AT BEDTIME 45 mL 3  . metFORMIN (GLUCOPHAGE) 1000 MG tablet TAKE 1 TABLET TWICE A DAY WITH MEALS FOR DIABETES 180 tablet 1  . montelukast (SINGULAIR) 10 MG tablet TAKE 1 TABLET AT BEDTIME 90 tablet 3  . tadalafil (CIALIS) 20 MG tablet TAKE 1 TABLET 30 MINUTES PRIOR TO INTERCOURSE AS NEEDED 21 tablet 0  . traZODone (DESYREL) 50 MG tablet Take 1 tablet (50 mg total) by mouth at bedtime as needed for sleep. 90 tablet 0   No current facility-administered medications on file prior to visit.    BP 120/70   Pulse 61   Temp (!) 96.2 F (35.7 C) (Temporal)   Ht 5' 9"  (1.753 m)   Wt 247 lb 12 oz (112.4 kg)   SpO2 98%   BMI 36.59 kg/m    Objective:   Physical Exam  Constitutional: He appears well-nourished.  Skin: Skin is warm and dry. No erythema.  0.5 cm hanging skin tag to right mid thoracic back. 0.25 cm slightly raised grey/scaly nevus to right lateral trunk.           Assessment & Plan:  Skin Tag/nevus removal:  Patient consented to removal, consent signed. Site prepped with betadine solution. Pain Ease spray used for analgesia.  11 blade used to remove skin tag. Shave biopsy tool used for removal of nevus. Nevus sent off for pathology.  Patient tolerated well. Silver nitrate stick used for bleeding. Dressing applied to both sites. Home care instructions  provided.  Pleas Koch, NP

## 2019-11-19 NOTE — Patient Instructions (Signed)
Keep the sites clean and dry.  Please update me if you notice redness, green drainage, increased pain to the site.  It was a pleasure to see you today!

## 2019-11-19 NOTE — Addendum Note (Signed)
Addended by: Jacqualin Combes on: 11/19/2019 10:30 AM   Modules accepted: Orders

## 2019-11-20 ENCOUNTER — Telehealth: Payer: Self-pay | Admitting: Primary Care

## 2019-11-20 NOTE — Telephone Encounter (Signed)
Completed and placed up front. Please call patient and notify him that this is ready.

## 2019-11-20 NOTE — Telephone Encounter (Signed)
Pt dropped off form to be filled out regarding his insulin use. States the letter that was written was not sufficient. Placed form in [place] tower.

## 2019-11-20 NOTE — Telephone Encounter (Signed)
Place form in Kate's inform for review

## 2019-11-20 NOTE — Telephone Encounter (Signed)
Patient have been notified.  

## 2019-12-03 MED ORDER — FREESTYLE LITE TEST VI STRP: ORAL_STRIP | 2 refills | Status: DC

## 2020-01-22 ENCOUNTER — Other Ambulatory Visit: Payer: Self-pay | Admitting: Internal Medicine

## 2020-01-22 DIAGNOSIS — G4700 Insomnia, unspecified: Secondary | ICD-10-CM

## 2020-01-31 DIAGNOSIS — K219 Gastro-esophageal reflux disease without esophagitis: Secondary | ICD-10-CM

## 2020-02-04 ENCOUNTER — Other Ambulatory Visit (INDEPENDENT_AMBULATORY_CARE_PROVIDER_SITE_OTHER)

## 2020-02-04 DIAGNOSIS — K219 Gastro-esophageal reflux disease without esophagitis: Secondary | ICD-10-CM | POA: Diagnosis not present

## 2020-02-04 NOTE — Addendum Note (Signed)
Addended by: Ellamae Sia on: 02/04/2020 10:57 AM   Modules accepted: Orders

## 2020-02-05 LAB — HELICOBACTER PYLORI  SPECIAL ANTIGEN
MICRO NUMBER:: 10698726
SPECIMEN QUALITY: ADEQUATE

## 2020-02-12 ENCOUNTER — Other Ambulatory Visit: Payer: Self-pay | Admitting: Primary Care

## 2020-02-12 DIAGNOSIS — F320 Major depressive disorder, single episode, mild: Secondary | ICD-10-CM

## 2020-02-12 MED ORDER — BUPROPION HCL ER (XL) 150 MG PO TB24: 150.0000 mg | ORAL_TABLET | Freq: Every day | ORAL | 1 refills | Status: DC

## 2020-02-25 ENCOUNTER — Other Ambulatory Visit: Payer: Self-pay | Admitting: Primary Care

## 2020-02-25 DIAGNOSIS — E119 Type 2 diabetes mellitus without complications: Secondary | ICD-10-CM

## 2020-03-21 ENCOUNTER — Other Ambulatory Visit: Payer: Self-pay | Admitting: Primary Care

## 2020-03-21 DIAGNOSIS — Z794 Long term (current) use of insulin: Secondary | ICD-10-CM

## 2020-03-23 LAB — HM DIABETES EYE EXAM

## 2020-04-04 ENCOUNTER — Other Ambulatory Visit: Payer: Self-pay | Admitting: Primary Care

## 2020-04-04 DIAGNOSIS — G4700 Insomnia, unspecified: Secondary | ICD-10-CM

## 2020-05-18 ENCOUNTER — Ambulatory Visit (INDEPENDENT_AMBULATORY_CARE_PROVIDER_SITE_OTHER): Admitting: Primary Care

## 2020-05-18 ENCOUNTER — Other Ambulatory Visit: Payer: Self-pay

## 2020-05-18 ENCOUNTER — Encounter: Payer: Self-pay | Admitting: *Deleted

## 2020-05-18 ENCOUNTER — Encounter: Payer: Self-pay | Admitting: Primary Care

## 2020-05-18 VITALS — BP 118/68 | HR 73 | Temp 98.2°F | Ht 69.0 in | Wt 242.0 lb

## 2020-05-18 DIAGNOSIS — E782 Mixed hyperlipidemia: Secondary | ICD-10-CM

## 2020-05-18 DIAGNOSIS — N529 Male erectile dysfunction, unspecified: Secondary | ICD-10-CM

## 2020-05-18 DIAGNOSIS — Z794 Long term (current) use of insulin: Secondary | ICD-10-CM

## 2020-05-18 DIAGNOSIS — E119 Type 2 diabetes mellitus without complications: Secondary | ICD-10-CM | POA: Diagnosis not present

## 2020-05-18 DIAGNOSIS — Z23 Encounter for immunization: Secondary | ICD-10-CM

## 2020-05-18 LAB — HM HEPATITIS C SCREENING LAB: HM Hepatitis Screen: NEGATIVE

## 2020-05-18 LAB — LDL CHOLESTEROL, DIRECT: Direct LDL: 89 mg/dL

## 2020-05-18 LAB — LIPID PANEL
Cholesterol: 216 mg/dL — ABNORMAL HIGH (ref 0–200)
HDL: 31.8 mg/dL — ABNORMAL LOW (ref >39.00)
Total CHOL/HDL Ratio: 7
Triglycerides: 619 mg/dL — ABNORMAL HIGH (ref 0.0–149.0)

## 2020-05-18 LAB — POCT GLYCOSYLATED HEMOGLOBIN (HGB A1C): Hemoglobin A1C: 7.2 % — AB (ref 4.0–5.6)

## 2020-05-18 LAB — HM HIV SCREENING LAB: HM HIV Screening: NEGATIVE

## 2020-05-18 LAB — MICROALBUMIN / CREATININE URINE RATIO
Creatinine,U: 116.6 mg/dL
Microalb Creat Ratio: 1.1 mg/g (ref 0.0–30.0)
Microalb, Ur: 1.2 mg/dL (ref 0.0–1.9)

## 2020-05-18 MED ORDER — LEVEMIR FLEXTOUCH 100 UNIT/ML ~~LOC~~ SOPN: PEN_INJECTOR | SUBCUTANEOUS | 3 refills | Status: DC

## 2020-05-18 NOTE — Patient Instructions (Addendum)
You will be contacted regarding your referral to Urology.  Please let us know if you have not been contacted within two weeks.   Stop by the lab prior to leaving today. I will notify you of your results once received.   Please schedule a follow up appointment in 6 months for your annual physical.   It was a pleasure to see you today!   Influenza (Flu) Vaccine (Inactivated or Recombinant): What You Need to Know 1. Why get vaccinated? Influenza vaccine can prevent influenza (flu). Flu is a contagious disease that spreads around the [place] every year, usually between October and May. Anyone can get the flu, but it is more dangerous for some people. Infants and young children, people 45 years of age and older, pregnant women, and people with certain health conditions or a weakened immune system are at greatest risk of flu complications. Pneumonia, bronchitis, sinus infections and ear infections are examples of flu-related complications. If you have a medical condition, such as heart disease, cancer or diabetes, flu can make it worse. Flu can cause fever and chills, sore throat, muscle aches, fatigue, cough, headache, and runny or stuffy nose. Some people may have vomiting and diarrhea, though this is more common in children than adults. Each year thousands of people in the [place] die from flu, and many more are hospitalized. Flu vaccine prevents millions of illnesses and flu-related visits to the doctor each year. 2. Influenza vaccine CDC recommends everyone 85 months of age and older get vaccinated every flu season. Children 6 months through 53 years of age may need 2 doses during a single flu season. Everyone else needs only 1 dose each flu season. It takes about 2 weeks for protection to develop after vaccination. There are many flu viruses, and they are always changing. Each year a new flu vaccine is made to protect against three or four viruses that are likely to cause disease in the  upcoming flu season. Even when the vaccine doesn't exactly match these viruses, it may still provide some protection. Influenza vaccine does not cause flu. Influenza vaccine may be given at the same time as other vaccines. 3. Talk with your health care provider Tell your vaccine provider if the person getting the vaccine:  Has had an allergic reaction after a previous dose of influenza vaccine, or has any severe, life-threatening allergies.  Has ever had Guillain-Barr Syndrome (also called GBS). In some cases, your health care provider may decide to postpone influenza vaccination to a future visit. People with minor illnesses, such as a cold, may be vaccinated. People who are moderately or severely ill should usually wait until they recover before getting influenza vaccine. Your health care provider can give you more information. 4. Risks of a vaccine reaction  Soreness, redness, and swelling where shot is given, fever, muscle aches, and headache can happen after influenza vaccine.  There may be a very small increased risk of Guillain-Barr Syndrome (GBS) after inactivated influenza vaccine (the flu shot). Young children who get the flu shot along with pneumococcal vaccine (PCV13), and/or DTaP vaccine at the same time might be slightly more likely to have a seizure caused by fever. Tell your health care provider if a child who is getting flu vaccine has ever had a seizure. People sometimes faint after medical procedures, including vaccination. Tell your provider if you feel dizzy or have vision changes or ringing in the ears. As with any medicine, there is a very remote chance of a  vaccine causing a severe allergic reaction, other serious injury, or death. 5. What if there is a serious problem? An allergic reaction could occur after the vaccinated person leaves the clinic. If you see signs of a severe allergic reaction (hives, swelling of the face and throat, difficulty breathing, a fast  heartbeat, dizziness, or weakness), call 9-1-1 and get the person to the nearest hospital. For other signs that concern you, call your health care provider. Adverse reactions should be reported to the Vaccine Adverse Event Reporting System (VAERS). Your health care provider will usually file this report, or you can do it yourself. Visit the VAERS website at www.vaers.SamedayNews.es or call (415) 223-5001.VAERS is only for reporting reactions, and VAERS staff do not give medical advice. 6. The National Vaccine Injury Compensation Program The [employer] Vaccine Injury Compensation Program (VICP) is a federal program that was created to compensate people who may have been injured by certain vaccines. Visit the VICP website at GoldCloset.com.ee or call 336-459-9159 to learn about the program and about filing a claim. There is a time limit to file a claim for compensation. 7. How can I learn more?  Ask your healthcare provider.  Call your local or state health department.  Contact the Centers for Disease Control and Prevention (CDC): ? Call (607) 252-6583 (1-800-CDC-INFO) or ? Visit CDC's https://gibson.com/ Vaccine Information Statement (Interim) Inactivated Influenza Vaccine (03/08/2018) This information is not intended to replace advice given to you by your health care provider. Make sure you discuss any questions you have with your health care provider. Document Revised: 10/30/2018 Document Reviewed: 03/12/2018 Elsevier Patient Education  [place].

## 2020-05-18 NOTE — Assessment & Plan Note (Signed)
Cialis at max dose is ineffective. Referral placed to Urology.

## 2020-05-18 NOTE — Assessment & Plan Note (Signed)
Improved with A1C of 7.2. Also on higher doses of Levemir at 60 units for which he increased himself two months ago.  Continue Levmeir at 60 units HS. Continue Metformin 1000 mg BID.  Strongly advised he work on his diet.  Statin and Zetia intolerant.  Consider reintroducing fenofibrate. Repeat lipids pending.  Pneumonia vaccination UTD.  Foot exam UTD. Eye exam UTD.  Follow up in 6 months.

## 2020-05-18 NOTE — Progress Notes (Signed)
Subjective:    Patient ID: Devon Jackson, male    DOB: [birth year], 54 y.o.   MRN: [id number]  HPI  This visit occurred during the SARS-CoV-2 public health emergency.  Safety protocols were in place, including screening questions prior to the visit, additional usage of staff PPE, and extensive cleaning of exam room while observing appropriate contact time as indicated for disinfecting solutions.   Devon Jackson is a 54 year old male with a history of OSA, type 2 diabetes, hyperlipidemia, insomnia who presents today for follow up of diabetes. He is also finding that Cialis 20 mg is ineffective.  Current medications include: Levemir 45 units HS, Metformin 1000 mg BID.  He's actually injecting 60 units of Levemir 2 months ago.   He is checking his blood glucose 1 times daily and is getting readings of:  AM fasting: 150's  Last A1C: 7.5 in April 2021, 7.2 today.  Last Eye Exam: Completed in August  Last Foot Exam: UTD Pneumonia Vaccination: Completed in 2017 ACE/ARB: None. Urine Microalbumin due. Statin: None. Intolerant. Stopped Zetia in July 2021 due to myalgias.   BP Readings from Last 3 Encounters:  05/18/20 118/68  11/19/19 120/70  11/15/19 120/82   Wt Readings from Last 3 Encounters:  05/18/20 242 lb (109.8 kg)  11/19/19 247 lb 12 oz (112.4 kg)  11/15/19 249 lb (112.9 kg)   He endorses a fair diet. He is eating pasta, candy bars. He is not exercising regularly.   Currently managed on Cialis 20 mg for which he's been taking as needed for erectile dysfunction. He's finding that the 20 mg dose is ineffective, also finds that he's noticed pelvic pain at this dose. He's been cutting the dose in half with improvement in pelvic pain but also with ineffective dose. He has not seen Urology.   Review of Systems  Eyes: Negative for visual disturbance.  Respiratory: Negative for shortness of breath.   Cardiovascular: Negative for chest pain.  Neurological: Negative for dizziness and  headaches.       Past Medical History:  Diagnosis Date  . Allergic rhinitis   . Frequent headaches   . Gout   . Hypertriglyceridemia   . Type 2 diabetes mellitus ([place])      Social History   Socioeconomic History  . Marital status: Married    Spouse name: Not on file  . Number of children: Not on file  . Years of education: Not on file  . Highest education level: Not on file  Occupational History  . Not on file  Tobacco Use  . Smoking status: Never Smoker  . Smokeless tobacco: Never Used  Vaping Use  . Vaping Use: Never used  Substance and Sexual Activity  . Alcohol use: Yes    Alcohol/week: 0.0 standard drinks    Comment: rarely  . Drug use: No  . Sexual activity: Not on file  Other Topics Concern  . Not on file  Social History Narrative   Married.   3 children.   Works as a Therapist, nutritional.   Enjoys relaxing, spending time outdoors.   Social Determinants of Health   Financial Resource Strain:   . Difficulty of Paying Living Expenses: Not on file  Food Insecurity:   . Worried About Charity fundraiser in the Last Year: Not on file  . Ran Out of Food in the Last Year: Not on file  Transportation Needs:   . Lack of Transportation (Medical): Not on file  . Lack  of Transportation (Non-Medical): Not on file  Physical Activity:   . Days of Exercise per Week: Not on file  . Minutes of Exercise per Session: Not on file  Stress:   . Feeling of Stress : Not on file  Social Connections:   . Frequency of Communication with Friends and Family: Not on file  . Frequency of Social Gatherings with Friends and Family: Not on file  . Attends Religious Services: Not on file  . Active Member of Clubs or Organizations: Not on file  . Attends Archivist Meetings: Not on file  . Marital Status: Not on file  Intimate Partner Violence:   . Fear of Current or Ex-Partner: Not on file  . Emotionally Abused: Not on file  . Physically Abused: Not on file  . Sexually  Abused: Not on file    Past Surgical History:  Procedure Laterality Date  . APPENDECTOMY    . POLYPECTOMY    . ROTATOR CUFF REPAIR  2009  . SEPTOPLASTY      Family History  Problem Relation Age of Onset  . Alcohol abuse Mother   . Diabetes Father   . Breast cancer Paternal Grandmother   . Lung cancer Paternal Grandfather     Allergies  Allergen Reactions  . Statins Other (See Comments)    Memory issues    Current Outpatient Medications on File Prior to Visit  Medication Sig Dispense Refill  . albuterol (PROAIR HFA) 108 (90 Base) MCG/ACT inhaler Inhale 2 puffs into the lungs every 6 (six) hours as needed. 24 g 4  . BD PEN NEEDLE NANO U/F 32G X 4 MM MISC USE TO CHECK BLOOD SUGAR AS DIRECTED 300 each 4  . blood glucose meter kit and supplies KIT Dispense based on patient and insurance preference. Use up to 3 times daily as directed. (Dx is E11.9). 1 each 0  . buPROPion (WELLBUTRIN XL) 150 MG 24 hr tablet Take 1 tablet (150 mg total) by mouth daily. For depression. 90 tablet 1  . clonazePAM (KLONOPIN) 0.5 MG tablet Take 0.5 mg by mouth as needed for anxiety.    . clotrimazole-betamethasone (LOTRISONE) cream Apply topically.    . cyclobenzaprine (FLEXERIL) 10 MG tablet Take 1 tablet by mouth once to twice daily as needed for severe headaches. 30 tablet 0  . DEXILANT 60 MG capsule Take 1 capsule (60 mg total) by mouth daily. For heartburn. 90 capsule 0  . EPINEPHrine (EPIPEN 2-PAK) 0.3 mg/0.3 mL IJ SOAJ injection Inject 0.3 mLs (0.3 mg total) into the muscle as needed for anaphylaxis. 1 each 0  . glucose blood (FREESTYLE LITE) test strip USE UP TO THREE TIMES A DAY AS DIRECTED 300 each 2  . Lancets (FREESTYLE) lancets USE TO TEST BLOOD SUGAR 2 TO 3 TIMES DAILY AND AS DIRECTED 300 each 1  . metFORMIN (GLUCOPHAGE) 1000 MG tablet TAKE 1 TABLET TWICE A DAY WITH MEALS FOR DIABETES 180 tablet 1  . montelukast (SINGULAIR) 10 MG tablet TAKE 1 TABLET AT BEDTIME 90 tablet 3  . tadalafil  (CIALIS) 20 MG tablet TAKE 1 TABLET 30 MINUTES PRIOR TO INTERCOURSE AS NEEDED 21 tablet 0  . traZODone (DESYREL) 50 MG tablet TAKE 1 TABLET AT BEDTIME AS NEEDED FOR SLEEP 90 tablet 0   No current facility-administered medications on file prior to visit.    BP 118/68   Pulse 73   Temp 98.2 F (36.8 C) (Temporal)   Ht 5' 9"  (1.753 m)   Wt 242  lb (109.8 kg)   SpO2 96%   BMI 35.74 kg/m    Objective:   Physical Exam Cardiovascular:     Rate and Rhythm: Normal rate and regular rhythm.  Pulmonary:     Effort: Pulmonary effort is normal.     Breath sounds: Normal breath sounds.  Musculoskeletal:     Cervical back: Neck supple.  Skin:    General: Skin is warm and dry.  Psychiatric:        Mood and Affect: Mood normal.            Assessment & Plan:

## 2020-05-18 NOTE — Assessment & Plan Note (Signed)
Intolerant to all statins and Zetia. Unclear why he came off of fenofibrate, may need to re-introduce. Repeat lipids pending.

## 2020-05-19 ENCOUNTER — Other Ambulatory Visit: Payer: Self-pay

## 2020-05-19 DIAGNOSIS — Z794 Long term (current) use of insulin: Secondary | ICD-10-CM

## 2020-05-19 DIAGNOSIS — E119 Type 2 diabetes mellitus without complications: Secondary | ICD-10-CM

## 2020-05-19 DIAGNOSIS — E782 Mixed hyperlipidemia: Secondary | ICD-10-CM

## 2020-05-19 MED ORDER — LEVEMIR FLEXTOUCH 100 UNIT/ML ~~LOC~~ SOPN: PEN_INJECTOR | SUBCUTANEOUS | 3 refills | Status: DC

## 2020-05-19 MED ORDER — FENOFIBRATE 145 MG PO TABS: 145.0000 mg | ORAL_TABLET | Freq: Every day | ORAL | 3 refills | Status: DC

## 2020-05-20 DIAGNOSIS — Z23 Encounter for immunization: Secondary | ICD-10-CM

## 2020-05-27 ENCOUNTER — Ambulatory Visit (INDEPENDENT_AMBULATORY_CARE_PROVIDER_SITE_OTHER): Admitting: Urology

## 2020-05-27 ENCOUNTER — Other Ambulatory Visit: Payer: Self-pay

## 2020-05-27 ENCOUNTER — Encounter: Payer: Self-pay | Admitting: Urology

## 2020-05-27 VITALS — BP 143/92 | HR 80 | Ht 69.0 in | Wt 242.0 lb

## 2020-05-27 DIAGNOSIS — N529 Male erectile dysfunction, unspecified: Secondary | ICD-10-CM

## 2020-05-27 DIAGNOSIS — N5201 Erectile dysfunction due to arterial insufficiency: Secondary | ICD-10-CM | POA: Insufficient documentation

## 2020-05-27 NOTE — Progress Notes (Signed)
05/27/2020 8:27 AM   Devon Jackson Aug 10, 1965 [id number]  Referring provider: Pleas Koch, NP [place],  [place] [address]  Chief Complaint  Patient presents with  . Erectile Dysfunction    HPI: Devon Jackson is a 54 y.o. male seen at the request of Alma Friendly, NP for evaluation of erectile dysfunction.   Onset erectile dysfunction ~2006  Complained of difficulty achieving erection  Initially started Cialis 10 mg which was effective  Over the years he had worsening symptoms and was titrated to 20 mg  Recently the 20 mg Cialis dose has not been effective  He presently has no partial erections, denies pain or curvature with erections  When he initially started PDE 5 inhibitor therapy he was having partial erections  Organic risk factors include diabetes, hyperlipidemia.  No prior tobacco history  Does have tiredness, fatigue.  Good libido  No recent testosterone level   PMH: Past Medical History:  Diagnosis Date  . Allergic rhinitis   . Frequent headaches   . Gout   . Hypertriglyceridemia   . Type 2 diabetes mellitus [facility])     Surgical History: Past Surgical History:  Procedure Laterality Date  . APPENDECTOMY    . POLYPECTOMY    . ROTATOR CUFF REPAIR  2009  . SEPTOPLASTY      Home Medications:  Allergies as of 05/27/2020      Reactions   Statins Other (See Comments)   Memory issues      Medication List       Accurate as of May 27, 2020  8:27 AM. If you have any questions, ask your nurse or doctor.        albuterol 108 (90 Base) MCG/ACT inhaler Commonly known as: ProAir HFA Inhale 2 puffs into the lungs every 6 (six) hours as needed.   BD Pen Needle Nano U/F 32G X 4 MM Misc Generic drug: Insulin Pen Needle USE TO CHECK BLOOD SUGAR AS DIRECTED   blood glucose meter kit and supplies Kit Dispense based on patient and insurance preference. Use up to 3 times daily as directed. (Dx is E11.9).   buPROPion 150 MG 24  hr tablet Commonly known as: Wellbutrin XL Take 1 tablet (150 mg total) by mouth daily. For depression.   clonazePAM 0.5 MG tablet Commonly known as: KLONOPIN Take 0.5 mg by mouth as needed for anxiety.   clotrimazole-betamethasone cream Commonly known as: LOTRISONE Apply topically.   cyclobenzaprine 10 MG tablet Commonly known as: FLEXERIL Take 1 tablet by mouth once to twice daily as needed for severe headaches.   Dexilant 60 MG capsule Generic drug: dexlansoprazole Take 1 capsule (60 mg total) by mouth daily. For heartburn.   EPINEPHrine 0.3 mg/0.3 mL Soaj injection Commonly known as: EpiPen 2-Pak Inject 0.3 mLs (0.3 mg total) into the muscle as needed for anaphylaxis.   fenofibrate 145 MG tablet Commonly known as: TRICOR Take 1 tablet (145 mg total) by mouth daily. For cholesterol.   freestyle lancets USE TO TEST BLOOD SUGAR 2 TO 3 TIMES DAILY AND AS DIRECTED   FREESTYLE LITE test strip Generic drug: glucose blood USE UP TO THREE TIMES A DAY AS DIRECTED   Levemir FlexTouch 100 UNIT/ML FlexPen Generic drug: insulin detemir INJECT 60 UNITS UNDER THE SKIN AT BEDTIME   metFORMIN 1000 MG tablet Commonly known as: GLUCOPHAGE TAKE 1 TABLET TWICE A DAY WITH MEALS FOR DIABETES   montelukast 10 MG tablet Commonly known as: SINGULAIR TAKE 1 TABLET  AT BEDTIME   tadalafil 20 MG tablet Commonly known as: CIALIS TAKE 1 TABLET 30 MINUTES PRIOR TO INTERCOURSE AS NEEDED   traZODone 50 MG tablet Commonly known as: DESYREL TAKE 1 TABLET AT BEDTIME AS NEEDED FOR SLEEP       Allergies:  Allergies  Allergen Reactions  . Statins Other (See Comments)    Memory issues    Family History: Family History  Problem Relation Age of Onset  . Alcohol abuse Mother   . Diabetes Father   . Breast cancer Paternal Grandmother   . Lung cancer Paternal Grandfather     Social History:  reports that he has never smoked. He has never used smokeless tobacco. He reports current  alcohol use. He reports that he does not use drugs.   Physical Exam: BP (!) 143/92   Pulse 80   Ht 5' 9"  (1.753 m)   Wt 242 lb (109.8 kg)   BMI 35.74 kg/m   Constitutional:  Alert and oriented, No acute distress. HEENT: [place] AT, moist mucus membranes.  Trachea midline, no masses. Cardiovascular: No clubbing, cyanosis, or edema. Respiratory: Normal respiratory effort, no increased work of breathing. GI: Abdomen is soft, nontender, nondistended, no abdominal masses GU: Phallus without lesions or plaques.  Testes descended bilaterally without masses or tenderness.  Estimated size 20 cc bilaterally.  Spermatic cord/epididymis palpably normal bilaterally Skin: No rashes, bruises or suspicious lesions. Neurologic: Grossly intact, no focal deficits, moving all 4 extremities. Psychiatric: Normal mood and affect.  Laboratory Data:  Lab Results  Component Value Date   CREATININE 0.84 11/15/2019    Lab Results  Component Value Date   PSA 0.5 11/15/2019   PSA 0.72 06/25/2018   PSA 0.44 11/23/2015    Lab Results  Component Value Date   HGBA1C 7.2 (A) 05/18/2020    Assessment & Plan:    1. Erectile dysfunction  Tadalafil no longer effective at 20 mg dose most likely related to a progression of pelvic vascular disease  He is not having partial erections and unlikely an alternative PDE 5 inhibitor would be effective  Other options were discussed including vacuum erection devices, intracavernosal injections and penile implant surgery.  He was provided additional literature  Check testosterone level, [place], MD  [facility] [address] [place], [place] [address] [phone]

## 2020-05-28 LAB — TESTOSTERONE: Testosterone: 397 ng/dL (ref 264–916)

## 2020-05-28 LAB — LUTEINIZING HORMONE: LH: 3.2 m[IU]/mL (ref 1.7–8.6)

## 2020-05-31 NOTE — Progress Notes (Signed)
Testosterone level was low normal.  Please add a free testosterone profile 1 to this lab work

## 2020-06-01 ENCOUNTER — Telehealth: Payer: Self-pay | Admitting: *Deleted

## 2020-06-01 NOTE — Telephone Encounter (Signed)
Added on free testosterone

## 2020-06-01 NOTE — Telephone Encounter (Signed)
-----   Message from Abbie Sons, MD sent at 05/31/2020 12:21 PM EST ----- Testosterone level was low normal.  Please add a free testosterone profile 1 to this lab work

## 2020-06-03 ENCOUNTER — Encounter: Payer: Self-pay | Admitting: *Deleted

## 2020-06-15 MED ORDER — TADALAFIL 20 MG PO TABS: ORAL_TABLET | ORAL | 0 refills | Status: DC

## 2020-06-23 LAB — SPECIMEN STATUS REPORT

## 2020-06-23 LAB — TESTOSTERONE FREE, PROFILE I
Albumin: 4.7 g/dL (ref 3.8–4.9)
Sex Hormone Binding: 24.1 nmol/L (ref 19.3–76.4)
Testost., Free, Calc: 101.5 pg/mL (ref 35.8–168.2)
Testosterone: 440 ng/dL (ref 264–916)

## 2020-07-03 ENCOUNTER — Other Ambulatory Visit: Payer: Self-pay | Admitting: Primary Care

## 2020-07-03 DIAGNOSIS — G4700 Insomnia, unspecified: Secondary | ICD-10-CM

## 2020-07-31 ENCOUNTER — Other Ambulatory Visit: Payer: Self-pay | Admitting: Primary Care

## 2020-07-31 DIAGNOSIS — F320 Major depressive disorder, single episode, mild: Secondary | ICD-10-CM

## 2020-08-24 ENCOUNTER — Other Ambulatory Visit: Payer: Self-pay | Admitting: Primary Care

## 2020-08-24 DIAGNOSIS — Z794 Long term (current) use of insulin: Secondary | ICD-10-CM

## 2020-08-24 DIAGNOSIS — N529 Male erectile dysfunction, unspecified: Secondary | ICD-10-CM

## 2020-08-24 DIAGNOSIS — E119 Type 2 diabetes mellitus without complications: Secondary | ICD-10-CM

## 2020-08-25 NOTE — Telephone Encounter (Signed)
Pharmacy requests refill on: Tadalafil 20 mg   LAST REFILL: 06/15/2020 (Q-21, R-0) LAST OV: 05/18/2020 NEXT OV: 11/16/2020 PHARMACY: [facility]   Pharmacy requests refill on: Metformin 1000 mg   LAST REFILL: 02/25/2020 (Q-180,R-1) LAST OV: 05/18/2020 NEXT OV: 11/16/2020 PHARMACY: [facility]  Hgb A1C (05/18/2020): 7.2

## 2020-08-25 NOTE — Telephone Encounter (Signed)
Refills sent to pharmacy. 

## 2020-09-29 ENCOUNTER — Other Ambulatory Visit: Payer: Self-pay

## 2020-09-29 MED ORDER — FREESTYLE LANCETS MISC
1 refills | Status: DC
Start: 2020-09-29 — End: 2022-02-22

## 2020-11-03 ENCOUNTER — Other Ambulatory Visit: Payer: Self-pay | Admitting: Primary Care

## 2020-11-03 DIAGNOSIS — J309 Allergic rhinitis, unspecified: Secondary | ICD-10-CM

## 2020-11-16 ENCOUNTER — Other Ambulatory Visit: Payer: Self-pay | Admitting: Primary Care

## 2020-11-16 ENCOUNTER — Ambulatory Visit: Admitting: Primary Care

## 2020-11-16 DIAGNOSIS — N529 Male erectile dysfunction, unspecified: Secondary | ICD-10-CM

## 2020-11-17 ENCOUNTER — Encounter: Payer: Self-pay | Admitting: Primary Care

## 2020-11-17 ENCOUNTER — Other Ambulatory Visit: Payer: Self-pay

## 2020-11-17 ENCOUNTER — Ambulatory Visit (INDEPENDENT_AMBULATORY_CARE_PROVIDER_SITE_OTHER): Admitting: Primary Care

## 2020-11-17 VITALS — BP 118/72 | HR 80 | Temp 97.6°F | Ht 69.0 in | Wt 242.0 lb

## 2020-11-17 DIAGNOSIS — E119 Type 2 diabetes mellitus without complications: Secondary | ICD-10-CM

## 2020-11-17 DIAGNOSIS — N529 Male erectile dysfunction, unspecified: Secondary | ICD-10-CM | POA: Diagnosis not present

## 2020-11-17 DIAGNOSIS — E782 Mixed hyperlipidemia: Secondary | ICD-10-CM

## 2020-11-17 DIAGNOSIS — Z125 Encounter for screening for malignant neoplasm of prostate: Secondary | ICD-10-CM | POA: Diagnosis not present

## 2020-11-17 DIAGNOSIS — R3129 Other microscopic hematuria: Secondary | ICD-10-CM

## 2020-11-17 DIAGNOSIS — Z794 Long term (current) use of insulin: Secondary | ICD-10-CM | POA: Diagnosis not present

## 2020-11-17 LAB — COMPREHENSIVE METABOLIC PANEL
ALT: 15 U/L (ref 0–53)
AST: 12 U/L (ref 0–37)
Albumin: 4.3 g/dL (ref 3.5–5.2)
Alkaline Phosphatase: 40 U/L (ref 39–117)
BUN: 18 mg/dL (ref 6–23)
CO2: 29 mEq/L (ref 19–32)
Calcium: 9.6 mg/dL (ref 8.4–10.5)
Chloride: 103 mEq/L (ref 96–112)
Creatinine, Ser: 1.07 mg/dL (ref 0.40–1.50)
GFR: 77.92 mL/min (ref >60.00)
Glucose, Bld: 151 mg/dL — ABNORMAL HIGH (ref 70–99)
Potassium: 4.3 mEq/L (ref 3.5–5.1)
Sodium: 139 mEq/L (ref 135–145)
Total Bilirubin: 0.7 mg/dL (ref 0.2–1.2)
Total Protein: 6.7 g/dL (ref 6.0–8.3)

## 2020-11-17 LAB — URINALYSIS, MICROSCOPIC ONLY

## 2020-11-17 LAB — POCT GLYCOSYLATED HEMOGLOBIN (HGB A1C): Hemoglobin A1C: 7.3 % — AB (ref 4.0–5.6)

## 2020-11-17 LAB — POC URINALSYSI DIPSTICK (AUTOMATED)
Bilirubin, UA: NEGATIVE
Glucose, UA: POSITIVE — AB
Ketones, UA: NEGATIVE
Leukocytes, UA: NEGATIVE
Nitrite, UA: NEGATIVE
Protein, UA: POSITIVE — AB
Spec Grav, UA: >=1.03 — AB (ref 1.010–1.025)
Urobilinogen, UA: 0.2 E.U./dL
pH, UA: 5 (ref 5.0–8.0)

## 2020-11-17 LAB — LIPID PANEL
Cholesterol: 168 mg/dL (ref 0–200)
HDL: 30.2 mg/dL — ABNORMAL LOW (ref >39.00)
NonHDL: 137.93
Total CHOL/HDL Ratio: 6
Triglycerides: 212 mg/dL — ABNORMAL HIGH (ref 0.0–149.0)
VLDL: 42.4 mg/dL — ABNORMAL HIGH (ref 0.0–40.0)

## 2020-11-17 LAB — PSA: PSA: 0.63 ng/mL (ref 0.10–4.00)

## 2020-11-17 LAB — LDL CHOLESTEROL, DIRECT: Direct LDL: 114 mg/dL

## 2020-11-17 NOTE — Patient Instructions (Addendum)
Stop by the lab prior to leaving today. I will notify you of your results once received.   Continue to work on a healthy diet. Ensure you are consuming 64 ounces of water daily.  I'll be in touch once I receive your urine test result.  Please schedule a physical appointment for 6 months.   It was a pleasure to see you today!

## 2020-11-17 NOTE — Assessment & Plan Note (Signed)
Doing well on PRN tadalafil 20 mg, continue same.

## 2020-11-17 NOTE — Progress Notes (Signed)
Subjective:    Patient ID: Devon Jackson, male    DOB: Jun 19, 1966, 55 y.o.   MRN: [id number]  HPI  Marquan Vokes is a very pleasant 55 y.o. male who presents today for follow up of diabetes.  Current medications include: Levemir 60 units HS, metformin 1000 mg BID  He is checking his blood glucose 1 times daily and is getting readings of 120-160's with an average of 140's.  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 He's never seen any blood. Denies polyuria, polydipsia.   Last A1C: 7.2 in October 2021, 7.3 today Last Eye Exam: UTD Last Foot Exam: UTD Pneumonia Vaccination: ACE/ARB: None. Urine micro negative Statin: None. Cannot tolerate  BP Readings from Last 3 Encounters:  11/17/20 118/72  05/27/20 (!) 143/92  05/18/20 118/68     Review of Systems  Eyes: Negative for visual disturbance.  Respiratory: Negative for shortness of breath.   Cardiovascular: Negative for chest pain.  Neurological: Negative for dizziness.         Past Medical History:  Diagnosis Date  . Allergic rhinitis   . Frequent headaches   . Gout   . Hypertriglyceridemia   . Type 2 diabetes mellitus ([place])     Social History   Socioeconomic History  . Marital status: Married    Spouse name: Not on file  . Number of children: Not on file  . Years of education: Not on file  . Highest education level: Not on file  Occupational History  . Not on file  Tobacco Use  . Smoking status: Never Smoker  . Smokeless tobacco: Never Used  Vaping Use  . Vaping Use: Never used  Substance and Sexual Activity  . Alcohol use: Yes    Alcohol/week: 0.0 standard drinks    Comment: rarely  . Drug use: No  . Sexual activity: Not on file  Other Topics Concern  . Not on file  Social History Narrative   Married.   3 children.   Works as a Therapist, nutritional.   Enjoys relaxing, spending time outdoors.   Social Determinants of Health   Financial Resource Strain:  Not on file  Food Insecurity: Not on file  Transportation Needs: Not on file  Physical Activity: Not on file  Stress: Not on file  Social Connections: Not on file  Intimate Partner Violence: Not on file    Past Surgical History:  Procedure Laterality Date  . APPENDECTOMY    . POLYPECTOMY    . ROTATOR CUFF REPAIR  2009  . SEPTOPLASTY      Family History  Problem Relation Age of Onset  . Alcohol abuse Mother   . Diabetes Father   . Breast cancer Paternal Grandmother   . Lung cancer Paternal Grandfather     Allergies  Allergen Reactions  . Statins Other (See Comments)    Memory issues    Current Outpatient Medications on File Prior to Visit  Medication Sig Dispense Refill  . albuterol (PROAIR HFA) 108 (90 Base) MCG/ACT inhaler Inhale 2 puffs into the lungs every 6 (six) hours as needed. 24 g 4  . BD PEN NEEDLE NANO U/F 32G X 4 MM MISC USE TO CHECK BLOOD SUGAR AS DIRECTED 300 each 4  . blood glucose meter kit and supplies KIT Dispense based on patient and insurance preference. Use up to 3 times daily as directed. (Dx is E11.9). 1 each 0  . buPROPion (WELLBUTRIN XL) 150  MG 24 hr tablet TAKE 1 TABLET DAILY FOR DEPRESSION 90 tablet 3  . clonazePAM (KLONOPIN) 0.5 MG tablet Take 0.5 mg by mouth as needed for anxiety.    . clotrimazole-betamethasone (LOTRISONE) cream Apply topically.    . cyclobenzaprine (FLEXERIL) 10 MG tablet Take 1 tablet by mouth once to twice daily as needed for severe headaches. 30 tablet 0  . DEXILANT 60 MG capsule Take 1 capsule (60 mg total) by mouth daily. For heartburn. 90 capsule 0  . EPINEPHrine (EPIPEN 2-PAK) 0.3 mg/0.3 mL IJ SOAJ injection Inject 0.3 mLs (0.3 mg total) into the muscle as needed for anaphylaxis. 1 each 0  . fenofibrate (TRICOR) 145 MG tablet Take 1 tablet (145 mg total) by mouth daily. For cholesterol. 90 tablet 3  . glucose blood (FREESTYLE LITE) test strip USE UP TO THREE TIMES A DAY AS DIRECTED 300 each 2  . insulin detemir (LEVEMIR  FLEXTOUCH) 100 UNIT/ML FlexPen INJECT 60 UNITS UNDER THE SKIN AT BEDTIME 45 mL 3  . Lancets (FREESTYLE) lancets USE TO TEST BLOOD SUGAR 2 TO 3 TIMES DAILY AND AS DIRECTED 300 each 1  . metFORMIN (GLUCOPHAGE) 1000 MG tablet TAKE 1 TABLET TWICE A DAY WITH MEALS FOR DIABETES 180 tablet 0  . montelukast (SINGULAIR) 10 MG tablet Take 1 tablet (10 mg total) by mouth at bedtime. For allergies. 90 tablet 0  . tadalafil (CIALIS) 20 MG tablet TAKE 1 TABLET 30 MINUTES PRIOR TO INTERCOURSE AS NEEDED 30 tablet 0  . traZODone (DESYREL) 50 MG tablet TAKE 1 TABLET AT BEDTIME AS NEEDED FOR SLEEP 90 tablet 3   No current facility-administered medications on file prior to visit.    BP 118/72   Pulse 80   Temp 97.6 F (36.4 C) (Temporal)   Ht 5' 9"  (1.753 m)   Wt 242 lb (109.8 kg)   SpO2 96%   BMI 35.74 kg/m  Objective:   Physical Exam Cardiovascular:     Rate and Rhythm: Normal rate and regular rhythm.  Pulmonary:     Effort: Pulmonary effort is normal.     Breath sounds: Normal breath sounds. No wheezing or rales.  Musculoskeletal:     Cervical back: Neck supple.  Skin:    General: Skin is warm and dry.  Neurological:     Mental Status: He is alert and oriented to person, place, and time.           Assessment & Plan:      This visit occurred during the SARS-CoV-2 public health emergency.  Safety protocols were in place, including screening questions prior to the visit, additional usage of staff PPE, and extensive cleaning of exam room while observing appropriate contact time as indicated for disinfecting solutions.

## 2020-11-17 NOTE — Assessment & Plan Note (Signed)
Resumed fenofibrate 145 mg, continue same.  Repeat lipid panel pending.

## 2020-11-17 NOTE — Assessment & Plan Note (Signed)
Patient notified at recent DOT physical. UA today with 2+ blood, no gross hematuria.  Will send for microscopic. Add PSA.  Consider Urology referral.

## 2020-11-17 NOTE — Assessment & Plan Note (Signed)
A1C today of 7.3 which is stable compared to last visit. Would like to see him <7, he agrees.  He will work on a healthy diet, regular exercise. Continue Levemir 60 units HS and metformin 1000 mg BID.  Cannot tolerate statins or Zetia. Continue fenofibrate. Urine micro UTD.  Checking UA today for presence of glucose and blood.   Follow up in 6 months.

## 2020-11-23 ENCOUNTER — Encounter: Payer: Self-pay | Admitting: Primary Care

## 2020-11-23 ENCOUNTER — Other Ambulatory Visit: Payer: Self-pay | Admitting: Primary Care

## 2020-11-23 DIAGNOSIS — Z794 Long term (current) use of insulin: Secondary | ICD-10-CM

## 2021-02-01 ENCOUNTER — Other Ambulatory Visit: Payer: Self-pay | Admitting: Primary Care

## 2021-02-01 DIAGNOSIS — J309 Allergic rhinitis, unspecified: Secondary | ICD-10-CM

## 2021-02-07 ENCOUNTER — Other Ambulatory Visit: Payer: Self-pay

## 2021-02-07 ENCOUNTER — Emergency Department (HOSPITAL_BASED_OUTPATIENT_CLINIC_OR_DEPARTMENT_OTHER)
Admission: EM | Admit: 2021-02-07 | Discharge: 2021-02-08 | Disposition: A | Discharge status: Home / Self Care | Attending: Emergency Medicine | Admitting: Emergency Medicine

## 2021-02-07 ENCOUNTER — Encounter (HOSPITAL_BASED_OUTPATIENT_CLINIC_OR_DEPARTMENT_OTHER): Payer: Self-pay

## 2021-02-07 DIAGNOSIS — Z79899 Other long term (current) drug therapy: Secondary | ICD-10-CM | POA: Diagnosis not present

## 2021-02-07 DIAGNOSIS — Z7984 Long term (current) use of oral hypoglycemic drugs: Secondary | ICD-10-CM | POA: Diagnosis not present

## 2021-02-07 DIAGNOSIS — T63441A Toxic effect of venom of bees, accidental (unintentional), initial encounter: Secondary | ICD-10-CM | POA: Insufficient documentation

## 2021-02-07 DIAGNOSIS — L509 Urticaria, unspecified: Secondary | ICD-10-CM | POA: Diagnosis not present

## 2021-02-07 DIAGNOSIS — E119 Type 2 diabetes mellitus without complications: Secondary | ICD-10-CM | POA: Diagnosis not present

## 2021-02-07 DIAGNOSIS — Z794 Long term (current) use of insulin: Secondary | ICD-10-CM | POA: Insufficient documentation

## 2021-02-07 DIAGNOSIS — X58XXXA Exposure to other specified factors, initial encounter: Secondary | ICD-10-CM | POA: Insufficient documentation

## 2021-02-07 NOTE — ED Triage Notes (Addendum)
Pt was working outside and got stung by multiple bees at appx. 1500 in the afternoon. Pt is allergic to bees and had to use his Epipen and took 50 mg of Benadryl immediately after being stung. Pt has swelling to the right anterior foot, left knuckle, and right forearm. Foot is painful to walk on.

## 2021-02-08 ENCOUNTER — Other Ambulatory Visit: Payer: Self-pay | Admitting: Primary Care

## 2021-02-08 DIAGNOSIS — Z91030 Bee allergy status: Secondary | ICD-10-CM

## 2021-02-08 LAB — CBG MONITORING, ED: Glucose-Capillary: 192 mg/dL — ABNORMAL HIGH (ref 70–99)

## 2021-02-08 MED ORDER — PREDNISONE 50 MG PO TABS: 50.0000 mg | ORAL_TABLET | Freq: Every day | ORAL | 0 refills | Status: DC

## 2021-02-08 MED ORDER — PREDNISONE 50 MG PO TABS
60.0000 mg | ORAL_TABLET | Freq: Once | ORAL | Status: AC
  Administered 2021-02-08: 60 mg via ORAL
  Filled 2021-02-08: qty 1

## 2021-02-08 MED ORDER — FAMOTIDINE 20 MG PO TABS
20.0000 mg | ORAL_TABLET | Freq: Once | ORAL | Status: AC
  Administered 2021-02-08: 20 mg via ORAL
  Filled 2021-02-08: qty 1

## 2021-02-08 MED ORDER — ACETAMINOPHEN 325 MG PO TABS
650.0000 mg | ORAL_TABLET | Freq: Once | ORAL | Status: AC
  Administered 2021-02-08: 650 mg via ORAL
  Filled 2021-02-08: qty 2

## 2021-02-08 MED ORDER — IBUPROFEN 400 MG PO TABS
400.0000 mg | ORAL_TABLET | Freq: Once | ORAL | Status: AC
  Administered 2021-02-08: 400 mg via ORAL
  Filled 2021-02-08: qty 1

## 2021-02-08 NOTE — ED Notes (Signed)
Pt verbalizes understanding of discharge instructions. Opportunity for questioning and answers were provided. Armand removed by staff, pt discharged from ED to home. Educated to pick up Rx.  

## 2021-02-08 NOTE — Discharge Instructions (Addendum)
Continue to take diphenhydramine (Benadryl) as needed.  For additional antihistamine effect, you may add famotidine (Pepcid AC).  Apply ice for 30 minutes at a time, 4 times a day.  Take ibuprofen and/or acetaminophen as needed for pain.  Please note that combining ibuprofen and acetaminophen gives you better pain relief than either medication by itself.  Please monitor your blood sugar while taking prednisone.  It is likely to go up and you may need additional medication to control your blood sugar.

## 2021-02-08 NOTE — ED Provider Notes (Signed)
[facility] EMERGENCY DEPT Provider Note   CSN: [id number] Arrival date & time: 02/07/21  2315     History Chief Complaint  Patient presents with   Insect Bite   Allergic Reaction    Devon Jackson is a 55 y.o. male.  The history is provided by the patient.  Allergic Reaction He has history of diabetes, hyperlipidemia, gout and comes in following bee stings.  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  He has a history of allergy to bee stings.  He took diphenhydramine and used his epinephrine autoinjector and came to the emergency department.  He denies any generalized rash and denies any difficulty breathing or swallowing.  With prior episodes of bee stings, he has developed urticaria and throat swelling.  Past Medical History:  Diagnosis Date   Allergic rhinitis    Frequent headaches    Gout    Hypertriglyceridemia    Type 2 diabetes mellitus ([place])     Patient Active Problem List   Diagnosis Date Noted   Microscopic hematuria 11/17/2020   Erectile dysfunction due to arterial insufficiency 05/27/2020   Erectile dysfunction 11/15/2019   Insomnia 10/16/2019   Current mild episode of major depressive disorder without prior episode ([place]) 10/05/2019   Bee sting allergy 11/05/2018   GERD (gastroesophageal reflux disease) 06/23/2017   Irregular heart rhythm 06/23/2017   OSA (obstructive sleep apnea) 06/23/2017   Trigger finger 12/13/2016   Preventative health care 11/26/2015   Type 2 diabetes mellitus ([place]) 09/23/2015   Frequent headaches 09/23/2015   Hyperlipidemia 09/23/2015   Allergic rhinitis 09/23/2015    Past Surgical History:  Procedure Laterality Date   APPENDECTOMY     POLYPECTOMY     ROTATOR CUFF REPAIR  2009   SEPTOPLASTY         Family History  Problem Relation Age of Onset   Alcohol abuse Mother    Diabetes Father    Breast cancer Paternal Grandmother     Lung cancer Paternal Grandfather     Social History   Tobacco Use   Smoking status: Never   Smokeless tobacco: Never  Vaping Use   Vaping Use: Never used  Substance Use Topics   Alcohol use: Yes    Alcohol/week: 0.0 standard drinks    Comment: rarely   Drug use: No    Home Medications Prior to Admission medications   Medication Sig Start Date End Date Taking? Authorizing Provider  albuterol (PROAIR HFA) 108 (90 Base) MCG/ACT inhaler Inhale 2 puffs into the lungs every 6 (six) hours as needed. 12/15/15   Pleas Koch, NP  BD PEN NEEDLE NANO U/F 32G X 4 MM MISC USE TO CHECK BLOOD SUGAR AS DIRECTED 11/24/16   Pleas Koch, NP  blood glucose meter kit and supplies KIT Dispense based on patient and insurance preference. Use up to 3 times daily as directed. (Dx is E11.9). 11/25/15   Pleas Koch, NP  buPROPion (WELLBUTRIN XL) 150 MG 24 hr tablet TAKE 1 TABLET DAILY FOR DEPRESSION 07/31/20   Pleas Koch, NP  clonazePAM (KLONOPIN) 0.5 MG tablet Take 0.5 mg by mouth as needed for anxiety.    [provider]  clotrimazole-betamethasone (LOTRISONE) cream Apply topically.    [provider]  cyclobenzaprine (FLEXERIL) 10 MG tablet Take 1 tablet by mouth once to twice daily as needed for severe headaches. 10/04/19   Pleas Koch,  NP  DEXILANT 60 MG capsule Take 1 capsule (60 mg total) by mouth daily. For heartburn. 05/08/19   Pleas Koch, NP  EPINEPHrine (EPIPEN 2-PAK) 0.3 mg/0.3 mL IJ SOAJ injection Inject 0.3 mLs (0.3 mg total) into the muscle as needed for anaphylaxis. 11/19/19   Pleas Koch, NP  fenofibrate (TRICOR) 145 MG tablet Take 1 tablet (145 mg total) by mouth daily. For cholesterol. 05/19/20   Pleas Koch, NP  glucose blood (FREESTYLE LITE) test strip USE UP TO THREE TIMES A DAY AS DIRECTED 12/03/19   Pleas Koch, NP  insulin detemir (LEVEMIR FLEXTOUCH) 100 UNIT/ML FlexPen INJECT 60 UNITS UNDER THE SKIN AT BEDTIME  05/19/20   Pleas Koch, NP  Lancets (FREESTYLE) lancets USE TO TEST BLOOD SUGAR 2 TO 3 TIMES DAILY AND AS DIRECTED 09/29/20   Pleas Koch, NP  metFORMIN (GLUCOPHAGE) 1000 MG tablet TAKE 1 TABLET TWICE A DAY WITH MEALS FOR DIABETES 11/23/20   Lesleigh Noe, MD  montelukast (SINGULAIR) 10 MG tablet TAKE 1 TABLET AT BEDTIME FOR ALLERGIES 02/02/21   Pleas Koch, NP  tadalafil (CIALIS) 20 MG tablet TAKE 1 TABLET 30 MINUTES PRIOR TO INTERCOURSE AS NEEDED 11/17/20   Pleas Koch, NP  traZODone (DESYREL) 50 MG tablet TAKE 1 TABLET AT BEDTIME AS NEEDED FOR SLEEP 07/03/20   Pleas Koch, NP    Allergies    Bee pollen and Statins  Review of Systems   Review of Systems  All other systems reviewed and are negative.  Physical Exam Updated Vital Signs BP 125/74   Pulse 93   Temp 98.7 F (37.1 C) (Oral)   Resp 20   Ht 5' 9"  (1.753 m) Comment: Simultaneous filing. User may not have seen previous data.  Wt 106.6 kg Comment: Simultaneous filing. User may not have seen previous data.  SpO2 94%   BMI 34.70 kg/m   Physical Exam Vitals and nursing note reviewed.  55 year old male, resting comfortably and in no acute distress. Vital signs are normal. Oxygen saturation is 94%, which is normal. Head is normocephalic and atraumatic. PERRLA, EOMI. Oropharynx is clear.  There is no swelling of the uvula or pharynx.  There is no pooling of secretions.  Phonation is normal. Neck is nontender and supple without adenopathy or JVD. Back is nontender and there is no CVA tenderness. Lungs are clear without rales, wheezes, or rhonchi. Chest is nontender. Heart has regular rate and rhythm without murmur. Abdomen is soft, flat, nontender without masses or hepatosplenomegaly and peristalsis is normoactive. Extremities: There is moderate swelling around the left foot and ankle with multiple stings noted.  No other swelling is seen. Skin is warm and dry without other rash. Neurologic:  Mental status is normal, cranial nerves are intact, there are no motor or sensory deficits.  ED Results / Procedures / Treatments    Procedures Procedures   Medications Ordered in ED Medications  predniSONE (DELTASONE) tablet 60 mg (60 mg Oral Given 02/08/21 0019)  famotidine (PEPCID) tablet 20 mg (20 mg Oral Given 02/08/21 0019)  ibuprofen (ADVIL) tablet 400 mg (400 mg Oral Given 02/08/21 0019)  acetaminophen (TYLENOL) tablet 650 mg (650 mg Oral Given 02/08/21 0019)    ED Course  I have reviewed the triage vital signs and the nursing notes.  MDM Rules/Calculators/A&P                         Multiple bee  stings with majority around his right foot and ankle.  No evidence of systemic reaction.  He is given prednisone, famotidine, ibuprofen, acetaminophen and will be discharged with prescription for prednisone.  Given diabetic history, glucose was checked.  Capillary blood glucose was 192.  Patient is advised to monitor his glucose carefully at home while on prednisone.  Old records reviewed, and he has no relevant past visits.  Final Clinical Impression(s) / ED Diagnoses Final diagnoses:  Bee sting reaction, accidental or unintentional, initial encounter    Rx / DC Orders ED Discharge Orders          Ordered    predniSONE (DELTASONE) 50 MG tablet  Daily        02/08/21 0761             Delora Fuel, MD 51/83/43 0050

## 2021-02-15 ENCOUNTER — Other Ambulatory Visit: Payer: Self-pay | Admitting: Primary Care

## 2021-02-15 DIAGNOSIS — N529 Male erectile dysfunction, unspecified: Secondary | ICD-10-CM

## 2021-02-25 DIAGNOSIS — F418 Other specified anxiety disorders: Secondary | ICD-10-CM

## 2021-02-26 NOTE — Telephone Encounter (Signed)
Please advise 

## 2021-02-27 MED ORDER — CLONAZEPAM 0.5 MG PO TABS: 0.5000 mg | ORAL_TABLET | ORAL | 0 refills | Status: DC | PRN

## 2021-03-03 ENCOUNTER — Telehealth: Payer: Self-pay | Admitting: Primary Care

## 2021-03-03 NOTE — Telephone Encounter (Signed)
Daniell from [facility] call in requesting to know if you have receive paper work about patient's C-pap equipment . Would like a call back . # [phone]

## 2021-03-04 NOTE — Telephone Encounter (Signed)
Called and they have received fax.

## 2021-03-05 NOTE — Telephone Encounter (Signed)
Spoke to [employer] new order has been sent for face mask, order filled out and sent in to fax number provided. She confirmed has been received.

## 2021-03-05 NOTE — Telephone Encounter (Signed)
Devon Jackson called in wanted to know about the C-PAP order.

## 2021-03-25 LAB — HM DIABETES EYE EXAM

## 2021-03-26 ENCOUNTER — Encounter: Payer: Self-pay | Admitting: Primary Care

## 2021-04-12 ENCOUNTER — Other Ambulatory Visit: Payer: Self-pay | Admitting: Primary Care

## 2021-04-12 DIAGNOSIS — E119 Type 2 diabetes mellitus without complications: Secondary | ICD-10-CM

## 2021-04-12 DIAGNOSIS — Z794 Long term (current) use of insulin: Secondary | ICD-10-CM

## 2021-04-13 ENCOUNTER — Other Ambulatory Visit: Payer: Self-pay | Admitting: Primary Care

## 2021-04-13 DIAGNOSIS — Z91030 Bee allergy status: Secondary | ICD-10-CM

## 2021-04-13 MED ORDER — BD PEN NEEDLE NANO U/F 32G X 4 MM MISC: 3 refills | Status: DC

## 2021-05-09 ENCOUNTER — Other Ambulatory Visit: Payer: Self-pay | Admitting: Primary Care

## 2021-05-09 DIAGNOSIS — R0602 Shortness of breath: Secondary | ICD-10-CM

## 2021-05-09 DIAGNOSIS — E782 Mixed hyperlipidemia: Secondary | ICD-10-CM

## 2021-05-11 MED ORDER — ALBUTEROL SULFATE HFA 108 (90 BASE) MCG/ACT IN AERS: 2.0000 | INHALATION_SPRAY | Freq: Four times a day (QID) | RESPIRATORY_TRACT | 0 refills | Status: DC | PRN

## 2021-05-17 ENCOUNTER — Other Ambulatory Visit: Payer: Self-pay | Admitting: Primary Care

## 2021-05-17 DIAGNOSIS — N529 Male erectile dysfunction, unspecified: Secondary | ICD-10-CM

## 2021-05-20 ENCOUNTER — Encounter: Payer: Self-pay | Admitting: Primary Care

## 2021-05-20 ENCOUNTER — Ambulatory Visit (INDEPENDENT_AMBULATORY_CARE_PROVIDER_SITE_OTHER): Admitting: Primary Care

## 2021-05-20 ENCOUNTER — Other Ambulatory Visit: Payer: Self-pay

## 2021-05-20 VITALS — BP 124/68 | HR 62 | Temp 97.6°F | Ht 69.0 in | Wt 244.0 lb

## 2021-05-20 DIAGNOSIS — E1165 Type 2 diabetes mellitus with hyperglycemia: Secondary | ICD-10-CM

## 2021-05-20 DIAGNOSIS — K219 Gastro-esophageal reflux disease without esophagitis: Secondary | ICD-10-CM

## 2021-05-20 DIAGNOSIS — Z0000 Encounter for general adult medical examination without abnormal findings: Secondary | ICD-10-CM | POA: Diagnosis not present

## 2021-05-20 DIAGNOSIS — Z23 Encounter for immunization: Secondary | ICD-10-CM

## 2021-05-20 DIAGNOSIS — Z794 Long term (current) use of insulin: Secondary | ICD-10-CM

## 2021-05-20 DIAGNOSIS — N5201 Erectile dysfunction due to arterial insufficiency: Secondary | ICD-10-CM

## 2021-05-20 DIAGNOSIS — R519 Headache, unspecified: Secondary | ICD-10-CM

## 2021-05-20 DIAGNOSIS — R3129 Other microscopic hematuria: Secondary | ICD-10-CM

## 2021-05-20 DIAGNOSIS — E782 Mixed hyperlipidemia: Secondary | ICD-10-CM | POA: Diagnosis not present

## 2021-05-20 DIAGNOSIS — J309 Allergic rhinitis, unspecified: Secondary | ICD-10-CM

## 2021-05-20 DIAGNOSIS — N529 Male erectile dysfunction, unspecified: Secondary | ICD-10-CM

## 2021-05-20 DIAGNOSIS — G4700 Insomnia, unspecified: Secondary | ICD-10-CM

## 2021-05-20 DIAGNOSIS — G4733 Obstructive sleep apnea (adult) (pediatric): Secondary | ICD-10-CM | POA: Diagnosis not present

## 2021-05-20 DIAGNOSIS — Z91030 Bee allergy status: Secondary | ICD-10-CM

## 2021-05-20 DIAGNOSIS — F3342 Major depressive disorder, recurrent, in full remission: Secondary | ICD-10-CM

## 2021-05-20 LAB — CBC
HCT: 44.4 % (ref 39.0–52.0)
Hemoglobin: 14.8 g/dL (ref 13.0–17.0)
MCHC: 33.4 g/dL (ref 30.0–36.0)
MCV: 86.6 fl (ref 78.0–100.0)
Platelets: 263 10*3/uL (ref 150.0–400.0)
RBC: 5.13 Mil/uL (ref 4.22–5.81)
RDW: 13.8 % (ref 11.5–15.5)
WBC: 4.3 10*3/uL (ref 4.0–10.5)

## 2021-05-20 LAB — COMPREHENSIVE METABOLIC PANEL
ALT: 18 U/L (ref 0–53)
AST: 14 U/L (ref 0–37)
Albumin: 4.6 g/dL (ref 3.5–5.2)
Alkaline Phosphatase: 39 U/L (ref 39–117)
BUN: 11 mg/dL (ref 6–23)
CO2: 30 mEq/L (ref 19–32)
Calcium: 9.5 mg/dL (ref 8.4–10.5)
Chloride: 103 mEq/L (ref 96–112)
Creatinine, Ser: 0.99 mg/dL (ref 0.40–1.50)
GFR: 85.23 mL/min (ref >60.00)
Glucose, Bld: 132 mg/dL — ABNORMAL HIGH (ref 70–99)
Potassium: 4.5 mEq/L (ref 3.5–5.1)
Sodium: 140 mEq/L (ref 135–145)
Total Bilirubin: 0.7 mg/dL (ref 0.2–1.2)
Total Protein: 6.7 g/dL (ref 6.0–8.3)

## 2021-05-20 LAB — LIPID PANEL
Cholesterol: 157 mg/dL (ref 0–200)
HDL: 31.8 mg/dL — ABNORMAL LOW (ref >39.00)
LDL Cholesterol: 108 mg/dL — ABNORMAL HIGH (ref 0–99)
NonHDL: 125.52
Total CHOL/HDL Ratio: 5
Triglycerides: 86 mg/dL (ref 0.0–149.0)
VLDL: 17.2 mg/dL (ref 0.0–40.0)

## 2021-05-20 LAB — HEMOGLOBIN A1C: Hgb A1c MFr Bld: 7.6 % — ABNORMAL HIGH (ref 4.6–6.5)

## 2021-05-20 LAB — MICROALBUMIN / CREATININE URINE RATIO
Creatinine,U: 100.6 mg/dL
Microalb Creat Ratio: 0.7 mg/g (ref 0.0–30.0)
Microalb, Ur: <0.7 mg/dL (ref 0.0–1.9)

## 2021-05-20 NOTE — Assessment & Plan Note (Signed)
Due for Shingrix and flu, both provided today. Other vaccines UTD. PSA due and pending. Colonoscopy UTD, due in 2025.  Discussed the importance of a healthy diet and regular exercise in order for weight loss, and to reduce the risk of further co-morbidity.  Exam today stable. Labs pending.

## 2021-05-20 NOTE — Assessment & Plan Note (Signed)
Compliant to CPAP nightly, continue same.  

## 2021-05-20 NOTE — Assessment & Plan Note (Signed)
Well controlled on bupropion XL 150 mg, continue same.

## 2021-05-20 NOTE — Assessment & Plan Note (Signed)
Urine microscopic negative.   Continue to monitor.

## 2021-05-20 NOTE — Patient Instructions (Addendum)
Stop by the lab prior to leaving today. I will notify you of your results once received.   It was a pleasure to see you today!  Preventive Care 37-56 Years Old, Male Preventive care refers to lifestyle choices and visits with your health care provider that can promote health and wellness. This includes: A yearly physical exam. This is also called an annual wellness visit. Regular dental and eye exams. Immunizations. Screening for certain conditions. Healthy lifestyle choices, such as: Eating a healthy diet. Getting regular exercise. Not using drugs or products that contain nicotine and tobacco. Limiting alcohol use. What can I expect for my preventive care visit? Physical exam Your health care provider will check your: Height and weight. These may be used to calculate your BMI (body mass index). BMI is a measurement that tells if you are at a healthy weight. Heart rate and blood pressure. Body temperature. Skin for abnormal spots. Counseling Your health care provider may ask you questions about your: Past medical problems. Family's medical history. Alcohol, tobacco, and drug use. Emotional well-being. Home life and relationship well-being. Sexual activity. Diet, exercise, and sleep habits. Work and work Statistician. Access to firearms. What immunizations do I need? Vaccines are usually given at various ages, according to a schedule. Your health care provider will recommend vaccines for you based on your age, medical history, and lifestyle or other factors, such as travel or where you work. What tests do I need? Blood tests Lipid and cholesterol levels. These may be checked every 5 years, or more often if you are over 76 years old. Hepatitis C test. Hepatitis B test. Screening Lung cancer screening. You may have this screening every year starting at age 71 if you have a 30-pack-year history of smoking and currently smoke or have quit within the past 15 years. Prostate cancer  screening. Recommendations will vary depending on your family history and other risks. Genital exam to check for testicular cancer or hernias. Colorectal cancer screening. All adults should have this screening starting at age 55 and continuing until age 58. Your health care provider may recommend screening at age 52 if you are at increased risk. You will have tests every 1-10 years, depending on your results and the type of screening test. Diabetes screening. This is done by checking your blood sugar (glucose) after you have not eaten for a while (fasting). You may have this done every 1-3 years. STD (sexually transmitted disease) testing, if you are at risk. Follow these instructions at home: Eating and drinking  Eat a diet that includes fresh fruits and vegetables, whole grains, lean protein, and low-fat dairy products. Take vitamin and mineral supplements as recommended by your health care provider. Do not drink alcohol if your health care provider tells you not to drink. If you drink alcohol: Limit how much you have to 0-2 drinks a day. Be aware of how much alcohol is in your drink. In the [place] one drink equals one 12 oz bottle of beer (355 mL), one 5 oz glass of wine (148 mL), or one 1 oz glass of hard liquor (44 mL). Lifestyle Take daily care of your teeth and gums. Brush your teeth every morning and night with fluoride toothpaste. Floss one time each day. Stay active. Exercise for at least 30 minutes 5 or more days each week. Do not use any products that contain nicotine or tobacco, such as cigarettes, e-cigarettes, and chewing tobacco. If you need help quitting, ask your health care provider. Do not  use drugs. If you are sexually active, practice safe sex. Use a condom or other form of protection to prevent STIs (sexually transmitted infections). If told by your health care provider, take low-dose aspirin daily starting at age 68. Find healthy ways to cope with stress, such  as: Meditation, yoga, or listening to music. Journaling. Talking to a trusted person. Spending time with friends and family. Safety Always wear your seat belt while driving or riding in a vehicle. Do not drive: If you have been drinking alcohol. Do not ride with someone who has been drinking. When you are tired or distracted. While texting. Wear a helmet and other protective equipment during sports activities. If you have firearms in your house, make sure you follow all gun safety procedures. What's next? Go to your health care provider once a year for an annual wellness visit. Ask your health care provider how often you should have your eyes and teeth checked. Stay up to date on all vaccines. This information is not intended to replace advice given to you by your health care provider. Make sure you discuss any questions you have with your health care provider. Document Revised: 09/18/2020 Document Reviewed: 07/05/2018 Elsevier Patient Education  2022 [employer].

## 2021-05-20 NOTE — Assessment & Plan Note (Signed)
Doing well on Trazodone 50 mg HS, continue same.

## 2021-05-20 NOTE — Assessment & Plan Note (Signed)
Glucose readings in the 110's-150's.  Compliant to Levemir 60 units and metformin 1000 mg BID.  Repeat A1C pending today.   Statin intolerant.  Urine microalbumin. Pneumonia vaccine UTD. Eye exam UTD.  Discussed the importance of a healthy diet and regular exercise in order for weight loss, and to reduce the risk of further co-morbidity.  Follow up in 6 months.

## 2021-05-20 NOTE — Assessment & Plan Note (Signed)
Compliant to Singulair 10 mg during Summer months, continue same.

## 2021-05-20 NOTE — Assessment & Plan Note (Signed)
Doing well on Cialis 20 mg PRN, continue same.

## 2021-05-20 NOTE — Assessment & Plan Note (Signed)
Stable, infrequent use of Dexilant 60 mg, continue same.

## 2021-05-20 NOTE — Assessment & Plan Note (Signed)
Statin intolerant, has failed multiple medications. Continue fenofibrate 145 mg.  Repeat lipid panel pending.

## 2021-05-20 NOTE — Progress Notes (Signed)
Subjective:    Patient ID: Devon Jackson, male    DOB: 05-Jun-1966, 55 y.o.   MRN: [id number]  HPI  Devon Jackson is a very pleasant 55 y.o. male who presents today for complete physical and follow up of chronic conditions.  Immunizations: -Tetanus: 2018 -Influenza: Due today  -Covid-19: Has not completed  -Shingles: Never completed  -Pneumonia:  2017  Diet: Fair diet.  Exercise: No regular exercise.  Eye exam: Completes annually  Dental exam: Completes semi-annually   Colonoscopy: Completed in 2020, due in 2025 PSA: Due  BP Readings from Last 3 Encounters:  05/20/21 124/68  02/08/21 126/79  11/17/20 118/72       Review of Systems  Constitutional:  Negative for unexpected weight change.  HENT:  Negative for rhinorrhea.   Eyes:  Negative for visual disturbance.  Respiratory:  Negative for cough and shortness of breath.   Cardiovascular:  Negative for chest pain.  Gastrointestinal:  Negative for constipation and diarrhea.  Genitourinary:  Negative for difficulty urinating.  Musculoskeletal:  Negative for arthralgias.  Skin:  Negative for rash.  Allergic/Immunologic: Positive for environmental allergies.  Neurological:  Negative for dizziness and headaches.  Psychiatric/Behavioral:  The patient is not nervous/anxious.         Past Medical History:  Diagnosis Date   Allergic rhinitis    Frequent headaches    Gout    Hypertriglyceridemia    Type 2 diabetes mellitus (HCC)     Social History   Socioeconomic History   Marital status: Married    Spouse name: Not on file   Number of children: Not on file   Years of education: Not on file   Highest education level: Not on file  Occupational History   Not on file  Tobacco Use   Smoking status: Never   Smokeless tobacco: Never  Vaping Use   Vaping Use: Never used  Substance and Sexual Activity   Alcohol use: Yes    Alcohol/week: 0.0 standard drinks    Comment: rarely   Drug use: No   Sexual activity:  Not on file  Other Topics Concern   Not on file  Social History Narrative   Married.   3 children.   Works as a Financial planner.   Enjoys relaxing, spending time outdoors.   Social Determinants of Health   Financial Resource Strain: Not on file  Food Insecurity: Not on file  Transportation Needs: Not on file  Physical Activity: Not on file  Stress: Not on file  Social Connections: Not on file  Intimate Partner Violence: Not on file    Past Surgical History:  Procedure Laterality Date   APPENDECTOMY     POLYPECTOMY     ROTATOR CUFF REPAIR  2009   SEPTOPLASTY      Family History  Problem Relation Age of Onset   Alcohol abuse Mother    Diabetes Father    Breast cancer Paternal Grandmother    Lung cancer Paternal Grandfather     Allergies  Allergen Reactions   Bee Pollen Anaphylaxis   Bee Venom Anaphylaxis   Statins Other (See Comments)    Memory issues    Current Outpatient Medications on File Prior to Visit  Medication Sig Dispense Refill   albuterol (PROAIR HFA) 108 (90 Base) MCG/ACT inhaler Inhale 2 puffs into the lungs every 6 (six) hours as needed. 8 g 0   BD PEN NEEDLE NANO U/F 32G X 4 MM MISC Use to inject insulin. 100 each 3  blood glucose meter kit and supplies KIT Dispense based on patient and insurance preference. Use up to 3 times daily as directed. (Dx is E11.9). 1 each 0   buPROPion (WELLBUTRIN XL) 150 MG 24 hr tablet TAKE 1 TABLET DAILY FOR DEPRESSION 90 tablet 3   clonazePAM (KLONOPIN) 0.5 MG tablet Take 1 tablet (0.5 mg total) by mouth as needed for anxiety. 15 tablet 0   clotrimazole-betamethasone (LOTRISONE) cream Apply topically.     cyclobenzaprine (FLEXERIL) 10 MG tablet Take 1 tablet by mouth once to twice daily as needed for severe headaches. 30 tablet 0   DEXILANT 60 MG capsule Take 1 capsule (60 mg total) by mouth daily. For heartburn. 90 capsule 0   EPINEPHrine 0.3 mg/0.3 mL IJ SOAJ injection INJECT 0.3 ML (0.3 MG TOTAL) INTO THE MUSCLE  AS NEEDED FOR ANAPHYLAXIS 2 each 0   fenofibrate (TRICOR) 145 MG tablet TAKE 1 TABLET DAILY FOR CHOLESTEROL 90 tablet 1   glucose blood (FREESTYLE LITE) test strip USE UP TO THREE TIMES A DAY AS DIRECTED 300 each 2   insulin detemir (LEVEMIR FLEXTOUCH) 100 UNIT/ML FlexPen INJECT 60 UNITS UNDER THE SKIN AT BEDTIME 45 mL 3   Lancets (FREESTYLE) lancets USE TO TEST BLOOD SUGAR 2 TO 3 TIMES DAILY AND AS DIRECTED 300 each 1   metFORMIN (GLUCOPHAGE) 1000 MG tablet TAKE 1 TABLET TWICE A DAY WITH MEALS FOR DIABETES 180 tablet 3   montelukast (SINGULAIR) 10 MG tablet TAKE 1 TABLET AT BEDTIME FOR ALLERGIES 90 tablet 1   tadalafil (CIALIS) 20 MG tablet TAKE 1 TABLET 30 MINUTES PRIOR TO INTERCOURSE AS NEEDED 30 tablet 0   traZODone (DESYREL) 50 MG tablet TAKE 1 TABLET AT BEDTIME AS NEEDED FOR SLEEP 90 tablet 3   No current facility-administered medications on file prior to visit.    BP 124/68   Pulse 62   Temp 97.6 F (36.4 C) (Temporal)   Ht $R'5\' 9"'tV$  (1.753 m)   Wt 244 lb (110.7 kg)   SpO2 94%   BMI 36.03 kg/m  Objective:   Physical Exam HENT:     Right Ear: Tympanic membrane and ear canal normal.     Left Ear: Tympanic membrane and ear canal normal.     Nose: Nose normal.     Right Sinus: No maxillary sinus tenderness or frontal sinus tenderness.     Left Sinus: No maxillary sinus tenderness or frontal sinus tenderness.  Eyes:     Conjunctiva/sclera: Conjunctivae normal.  Neck:     Thyroid: No thyromegaly.     Vascular: No carotid bruit.  Cardiovascular:     Rate and Rhythm: Normal rate and regular rhythm.     Heart sounds: Normal heart sounds.  Pulmonary:     Effort: Pulmonary effort is normal.     Breath sounds: Normal breath sounds. No wheezing or rales.  Abdominal:     General: Bowel sounds are normal.     Palpations: Abdomen is soft.     Tenderness: There is no abdominal tenderness.  Musculoskeletal:        General: Normal range of motion.     Cervical back: Neck supple.   Skin:    General: Skin is warm and dry.  Neurological:     Mental Status: He is alert and oriented to person, place, and time.     Cranial Nerves: No cranial nerve deficit.     Deep Tendon Reflexes: Reflexes are normal and symmetric.  Psychiatric:  Mood and Affect: Mood normal.          Assessment & Plan:      This visit occurred during the SARS-CoV-2 public health emergency.  Safety protocols were in place, including screening questions prior to the visit, additional usage of staff PPE, and extensive cleaning of exam room while observing appropriate contact time as indicated for disinfecting solutions.

## 2021-05-20 NOTE — Assessment & Plan Note (Signed)
Improved, infrequent use of cyclobenzaprine 10 mg and clonazepam 0.5 mg. Continue same.

## 2021-05-20 NOTE — Assessment & Plan Note (Signed)
Recent episode requiring ED visit.  Refills of Epi Pen are up to date.

## 2021-05-24 NOTE — Addendum Note (Signed)
Addended by: Francella Solian on: 05/24/2021 08:39 AM   Modules accepted: Orders

## 2021-06-23 ENCOUNTER — Other Ambulatory Visit: Payer: Self-pay | Admitting: Primary Care

## 2021-06-23 DIAGNOSIS — E119 Type 2 diabetes mellitus without complications: Secondary | ICD-10-CM

## 2021-06-28 ENCOUNTER — Other Ambulatory Visit: Payer: Self-pay | Admitting: Primary Care

## 2021-06-28 DIAGNOSIS — G4700 Insomnia, unspecified: Secondary | ICD-10-CM

## 2021-07-04 DIAGNOSIS — E1165 Type 2 diabetes mellitus with hyperglycemia: Secondary | ICD-10-CM

## 2021-07-04 DIAGNOSIS — Z794 Long term (current) use of insulin: Secondary | ICD-10-CM

## 2021-07-06 MED ORDER — FREESTYLE LITE TEST VI STRP: ORAL_STRIP | 2 refills | Status: DC

## 2021-07-26 ENCOUNTER — Other Ambulatory Visit: Payer: Self-pay | Admitting: Primary Care

## 2021-07-26 DIAGNOSIS — F320 Major depressive disorder, single episode, mild: Secondary | ICD-10-CM

## 2021-08-02 ENCOUNTER — Other Ambulatory Visit: Payer: Self-pay | Admitting: Primary Care

## 2021-08-02 DIAGNOSIS — J309 Allergic rhinitis, unspecified: Secondary | ICD-10-CM

## 2021-08-16 ENCOUNTER — Other Ambulatory Visit: Payer: Self-pay | Admitting: Primary Care

## 2021-08-16 DIAGNOSIS — N529 Male erectile dysfunction, unspecified: Secondary | ICD-10-CM

## 2021-08-25 HISTORY — PX: LITHOTRIPSY: SUR834

## 2021-08-26 ENCOUNTER — Emergency Department (HOSPITAL_BASED_OUTPATIENT_CLINIC_OR_DEPARTMENT_OTHER)

## 2021-08-26 ENCOUNTER — Other Ambulatory Visit: Payer: Self-pay

## 2021-08-26 ENCOUNTER — Emergency Department (HOSPITAL_BASED_OUTPATIENT_CLINIC_OR_DEPARTMENT_OTHER)
Admission: EM | Admit: 2021-08-26 | Discharge: 2021-08-26 | Disposition: A | Discharge status: Home / Self Care | Attending: Emergency Medicine | Admitting: Emergency Medicine

## 2021-08-26 ENCOUNTER — Encounter (HOSPITAL_BASED_OUTPATIENT_CLINIC_OR_DEPARTMENT_OTHER): Payer: Self-pay

## 2021-08-26 DIAGNOSIS — Z794 Long term (current) use of insulin: Secondary | ICD-10-CM | POA: Diagnosis not present

## 2021-08-26 DIAGNOSIS — R109 Unspecified abdominal pain: Secondary | ICD-10-CM | POA: Diagnosis present

## 2021-08-26 DIAGNOSIS — N200 Calculus of kidney: Secondary | ICD-10-CM | POA: Insufficient documentation

## 2021-08-26 DIAGNOSIS — Z7984 Long term (current) use of oral hypoglycemic drugs: Secondary | ICD-10-CM | POA: Insufficient documentation

## 2021-08-26 DIAGNOSIS — E119 Type 2 diabetes mellitus without complications: Secondary | ICD-10-CM | POA: Diagnosis not present

## 2021-08-26 DIAGNOSIS — K219 Gastro-esophageal reflux disease without esophagitis: Secondary | ICD-10-CM | POA: Insufficient documentation

## 2021-08-26 LAB — CBC WITH DIFFERENTIAL/PLATELET
Abs Immature Granulocytes: 0.03 10*3/uL (ref 0.00–0.07)
Basophils Absolute: 0 10*3/uL (ref 0.0–0.1)
Basophils Relative: 0 %
Eosinophils Absolute: 0 10*3/uL (ref 0.0–0.5)
Eosinophils Relative: 0 %
HCT: 43.7 % (ref 39.0–52.0)
Hemoglobin: 15.1 g/dL (ref 13.0–17.0)
Immature Granulocytes: 0 %
Lymphocytes Relative: 8 %
Lymphs Abs: 0.8 10*3/uL (ref 0.7–4.0)
MCH: 29 pg (ref 26.0–34.0)
MCHC: 34.6 g/dL (ref 30.0–36.0)
MCV: 84 fL (ref 80.0–100.0)
Monocytes Absolute: 0.5 10*3/uL (ref 0.1–1.0)
Monocytes Relative: 5 %
Neutro Abs: 9.1 10*3/uL — ABNORMAL HIGH (ref 1.7–7.7)
Neutrophils Relative %: 87 %
Platelets: 278 10*3/uL (ref 150–400)
RBC: 5.2 MIL/uL (ref 4.22–5.81)
RDW: 12.7 % (ref 11.5–15.5)
WBC: 10.5 10*3/uL (ref 4.0–10.5)
nRBC: 0 % (ref 0.0–0.2)

## 2021-08-26 LAB — COMPREHENSIVE METABOLIC PANEL
ALT: 15 U/L (ref 0–44)
AST: 14 U/L — ABNORMAL LOW (ref 15–41)
Albumin: 4.6 g/dL (ref 3.5–5.0)
Alkaline Phosphatase: 39 U/L (ref 38–126)
Anion gap: 13 (ref 5–15)
BUN: 16 mg/dL (ref 6–20)
CO2: 23 mmol/L (ref 22–32)
Calcium: 10.4 mg/dL — ABNORMAL HIGH (ref 8.9–10.3)
Chloride: 103 mmol/L (ref 98–111)
Creatinine, Ser: 1.48 mg/dL — ABNORMAL HIGH (ref 0.61–1.24)
GFR, Estimated: 55 mL/min — ABNORMAL LOW (ref >60)
Glucose, Bld: 170 mg/dL — ABNORMAL HIGH (ref 70–99)
Potassium: 3.4 mmol/L — ABNORMAL LOW (ref 3.5–5.1)
Sodium: 139 mmol/L (ref 135–145)
Total Bilirubin: 0.7 mg/dL (ref 0.3–1.2)
Total Protein: 6.9 g/dL (ref 6.5–8.1)

## 2021-08-26 LAB — URINALYSIS, ROUTINE W REFLEX MICROSCOPIC
Bilirubin Urine: NEGATIVE
Glucose, UA: NEGATIVE mg/dL
Hgb urine dipstick: NEGATIVE
Ketones, ur: NEGATIVE mg/dL
Leukocytes,Ua: NEGATIVE
Nitrite: NEGATIVE
Protein, ur: NEGATIVE mg/dL
Specific Gravity, Urine: 1.01 (ref 1.005–1.030)
pH: 7 (ref 5.0–8.0)

## 2021-08-26 LAB — LIPASE, BLOOD: Lipase: 13 U/L (ref 11–51)

## 2021-08-26 MED ORDER — SODIUM CHLORIDE 0.9 % IV BOLUS
1000.0000 mL | Freq: Once | INTRAVENOUS | Status: AC
  Administered 2021-08-26: 1000 mL via INTRAVENOUS

## 2021-08-26 MED ORDER — ONDANSETRON HCL 4 MG/2ML IJ SOLN
4.0000 mg | Freq: Once | INTRAMUSCULAR | Status: AC
  Administered 2021-08-26: 4 mg via INTRAVENOUS
  Filled 2021-08-26: qty 2

## 2021-08-26 MED ORDER — KETOROLAC TROMETHAMINE 30 MG/ML IJ SOLN
30.0000 mg | Freq: Once | INTRAMUSCULAR | Status: AC
  Administered 2021-08-26: 30 mg via INTRAVENOUS
  Filled 2021-08-26: qty 1

## 2021-08-26 MED ORDER — TAMSULOSIN HCL 0.4 MG PO CAPS: 0.4000 mg | ORAL_CAPSULE | Freq: Every day | ORAL | 0 refills | Status: DC

## 2021-08-26 MED ORDER — MORPHINE SULFATE (PF) 4 MG/ML IV SOLN
4.0000 mg | Freq: Once | INTRAVENOUS | Status: AC
  Administered 2021-08-26: 4 mg via INTRAVENOUS
  Filled 2021-08-26: qty 1

## 2021-08-26 MED ORDER — KETOROLAC TROMETHAMINE 10 MG PO TABS
10.0000 mg | ORAL_TABLET | Freq: Four times a day (QID) | ORAL | 0 refills | Status: DC | PRN
Start: 2021-08-26 — End: 2023-05-30

## 2021-08-26 MED ORDER — HYDROCODONE-ACETAMINOPHEN 5-325 MG PO TABS
2.0000 | ORAL_TABLET | Freq: Four times a day (QID) | ORAL | 0 refills | Status: AC | PRN
Start: 2021-08-26 — End: 2021-08-29

## 2021-08-26 NOTE — ED Triage Notes (Signed)
Patient here POV from Home with Lower Back Pain.  Patient endorses using the Bathroom to have a BM today with no difficulty and then began to have Left Sided Back Pain that has been worsening since.  No Diarrhea. No Constipation. No Fevers. No Urinary Symptoms.   Uncomfortable during Triage. A&Ox4. GCS 15. BIB Wheelchair.

## 2021-08-26 NOTE — ED Provider Notes (Signed)
[facility] Provider Note   CSN: [id number] Arrival date & time: 08/26/21  1343     History  Chief Complaint  Patient presents with   Back Pain    Devon Jackson is a 56 y.o. male.  With past medical history of type 2 diabetes, hyperlipidemia, GERD who presents to the emergency department with left-sided flank pain.  Patient states that pain began suddenly "hours ago."  He describes it as progressively worsening, dull, "pushing" pain that radiates from his left flank to his left abdomen.  He has had associated nausea without vomiting.  States that additionally he has had episodes of loose stool today.  He denies blood in stool.  Denies dysuria, hematuria, change in frequency or urgency.  Denies previous abdominal surgeries.   Back Pain Associated symptoms: abdominal pain   Associated symptoms: no fever       Home Medications Prior to Admission medications   Medication Sig Start Date End Date Taking? Authorizing Provider  buPROPion (WELLBUTRIN XL) 150 MG 24 hr tablet TAKE 1 TABLET DAILY FOR DEPRESSION 07/26/21  Yes Pleas Koch, NP  fenofibrate (TRICOR) 145 MG tablet TAKE 1 TABLET DAILY FOR CHOLESTEROL 05/09/21  Yes Pleas Koch, NP  insulin detemir (LEVEMIR FLEXTOUCH) 100 UNIT/ML FlexPen INJECT 60 UNITS UNDER THE SKIN AT BEDTIME 06/23/21  Yes Pleas Koch, NP  metFORMIN (GLUCOPHAGE) 1000 MG tablet TAKE 1 TABLET TWICE A DAY WITH MEALS FOR DIABETES 11/23/20  Yes Lesleigh Noe, MD  traZODone (DESYREL) 50 MG tablet TAKE 1 TABLET AT BEDTIME AS NEEDED FOR SLEEP 06/29/21  Yes Pleas Koch, NP  albuterol (PROAIR HFA) 108 (90 Base) MCG/ACT inhaler Inhale 2 puffs into the lungs every 6 (six) hours as needed. 05/11/21   Pleas Koch, NP  BD PEN NEEDLE NANO U/F 32G X 4 MM MISC Use to inject insulin. 04/13/21   Pleas Koch, NP  blood glucose meter kit and supplies KIT Dispense based on patient and insurance preference. Use up to 3  times daily as directed. (Dx is E11.9). 11/25/15   Pleas Koch, NP  clonazePAM (KLONOPIN) 0.5 MG tablet Take 1 tablet (0.5 mg total) by mouth as needed for anxiety. 02/27/21   Pleas Koch, NP  clotrimazole-betamethasone (LOTRISONE) cream Apply topically.    [provider]  cyclobenzaprine (FLEXERIL) 10 MG tablet Take 1 tablet by mouth once to twice daily as needed for severe headaches. 10/04/19   Pleas Koch, NP  DEXILANT 60 MG capsule Take 1 capsule (60 mg total) by mouth daily. For heartburn. 05/08/19   Pleas Koch, NP  EPINEPHrine 0.3 mg/0.3 mL IJ SOAJ injection INJECT 0.3 ML (0.3 MG TOTAL) INTO THE MUSCLE AS NEEDED FOR ANAPHYLAXIS 04/14/21   Pleas Koch, NP  glucose blood (FREESTYLE LITE) test strip USE UP TO THREE TIMES A DAY AS DIRECTED 07/06/21   Pleas Koch, NP  Lancets (FREESTYLE) lancets USE TO TEST BLOOD SUGAR 2 TO 3 TIMES DAILY AND AS DIRECTED 09/29/20   Pleas Koch, NP  montelukast (SINGULAIR) 10 MG tablet TAKE 1 TABLET AT BEDTIME FOR ALLERGIES 08/02/21   Pleas Koch, NP  tadalafil (CIALIS) 20 MG tablet TAKE 1 TABLET 30 MINUTES PRIOR TO INTERCOURSE AS NEEDED 08/16/21   Pleas Koch, NP      Allergies    Bee pollen, Bee venom, and Statins    Review of Systems   Review of Systems  Constitutional:  Positive for appetite  change. Negative for fever.  Gastrointestinal:  Positive for abdominal pain, diarrhea, nausea and vomiting. Negative for blood in stool.  Genitourinary:  Positive for flank pain.  All other systems reviewed and are negative.  Physical Exam Updated Vital Signs BP (!) 158/95 (BP Location: Right Arm)    Pulse 77    Temp 98.2 F (36.8 C)    Resp 18    Ht 5' 9"  (1.753 m)    Wt 104.3 kg    SpO2 98%    BMI 33.97 kg/m  Physical Exam Vitals and nursing note reviewed.  Constitutional:      General: He is in acute distress.     Appearance: He is ill-appearing. He is not toxic-appearing or diaphoretic.  HENT:      Head: Normocephalic and atraumatic.     Nose: Nose normal.     Mouth/Throat:     Mouth: Mucous membranes are moist.     Pharynx: Oropharynx is clear.  Eyes:     General: No scleral icterus. Cardiovascular:     Rate and Rhythm: Normal rate.     Pulses: Normal pulses.     Heart sounds: No murmur heard. Pulmonary:     Effort: Pulmonary effort is normal. No respiratory distress.     Breath sounds: Normal breath sounds.  Abdominal:     General: Bowel sounds are normal. There is no distension.     Palpations: Abdomen is soft.     Tenderness: There is abdominal tenderness. There is left CVA tenderness. There is no right CVA tenderness.  Musculoskeletal:        General: Normal range of motion.     Cervical back: Neck supple.  Skin:    General: Skin is warm and dry.     Capillary Refill: Capillary refill takes less than 2 seconds.     Findings: No rash.  Neurological:     General: No focal deficit present.     Mental Status: He is alert and oriented to person, place, and time. Mental status is at baseline.  Psychiatric:        Mood and Affect: Mood normal.        Behavior: Behavior normal.        Thought Content: Thought content normal.        Judgment: Judgment normal.    ED Results / Procedures / Treatments   Labs (all labs ordered are listed, but only abnormal results are displayed) Labs Reviewed  COMPREHENSIVE METABOLIC PANEL - Abnormal; Notable for the following components:      Result Value   Potassium 3.4 (*)    Glucose, Bld 170 (*)    Creatinine, Ser 1.48 (*)    Calcium 10.4 (*)    AST 14 (*)    GFR, Estimated 55 (*)    All other components within normal limits  CBC WITH DIFFERENTIAL/PLATELET - Abnormal; Notable for the following components:   Neutro Abs 9.1 (*)    All other components within normal limits  URINALYSIS, ROUTINE W REFLEX MICROSCOPIC  LIPASE, BLOOD   EKG None  Radiology CT Renal Stone Study  Result Date: 08/26/2021 CLINICAL DATA:   Left-sided back pain. EXAM: CT ABDOMEN AND PELVIS WITHOUT CONTRAST TECHNIQUE: Multidetector CT imaging of the abdomen and pelvis was performed following the standard protocol without IV contrast. RADIATION DOSE REDUCTION: This exam was performed according to the departmental dose-optimization program which includes automated exposure control, adjustment of the mA and/or kV according to patient size and/or use of  iterative reconstruction technique. COMPARISON:  None. FINDINGS: Lower chest: No acute abnormality. Hepatobiliary: Mild diffusely decreased liver density without focal abnormality. Status post cholecystectomy. No biliary dilatation. Pancreas: Unremarkable. No pancreatic ductal dilatation or surrounding inflammatory changes. Spleen: Normal in size without focal abnormality. Adrenals/Urinary Tract: Adrenal glands are unremarkable. Punctate right renal calculus. Punctate and 9 mm left renal calculi. No ureteral calculi or hydronephrosis. Punctate calculus in the bladder medial to the left UVJ. The bladder is otherwise unremarkable. Stomach/Bowel: Stomach is within normal limits. History of prior appendectomy. No evidence of bowel wall thickening, distention, or inflammatory changes. Vascular/Lymphatic: Aortic atherosclerosis. No enlarged abdominal or pelvic lymph nodes. Reproductive: Prostate is unremarkable. Other: Small fat containing right inguinal hernia. No free fluid or pneumoperitoneum. Musculoskeletal: No acute or significant osseous findings. IMPRESSION: 1. Punctate calculus in the bladder medial to the left UVJ, consistent with recently passed stone. 2. Additional bilateral nonobstructive nephrolithiasis. 3. Mild hepatic steatosis. 4. Aortic Atherosclerosis (ICD10-I70.0). Electronically Signed   By: Titus Dubin M.D.   On: 08/26/2021 15:40    Procedures Procedures   Medications Ordered in ED Medications  sodium chloride 0.9 % bolus 1,000 mL (0 mLs Intravenous Stopped 08/26/21 1822)  morphine  (PF) 4 MG/ML injection 4 mg (4 mg Intravenous Given 08/26/21 1512)  ondansetron (ZOFRAN) injection 4 mg (4 mg Intravenous Given 08/26/21 1512)  ketorolac (TORADOL) 30 MG/ML injection 30 mg (30 mg Intravenous Given 08/26/21 1637)    ED Course/ Medical Decision Making/ A&P                           Medical Decision Making Amount and/or Complexity of Data Reviewed Labs: ordered. Radiology: ordered.  Risk Prescription drug management.  Patient presents to the ED with complaints of flank pain. This involves an extensive number of treatment options, and is a complaint that carries with it a moderate to high risk of complications and morbidity.   Additional history obtained:  Additional history obtained from: Wife at bedside External records from outside source obtained and reviewed including: Previous primary care and ED visits  Cardiac Monitoring: The patient was maintained on a cardiac monitor.  I personally viewed and interpreted the cardiac monitored which showed an underlying rhythm of: Sinus rhythm  Lab Results: I personally ordered, reviewed, and interpreted labs. Pertinent results include: CBC within normal limits BMP without gross electrolyte dysfunction.  Creatinine elevated at 1.48, likely prerenal UA without evidence of infection Lipase negative  Imaging Studies ordered:  I ordered imaging studies which included CT.  I independently reviewed & interpreted imaging & am in agreement with radiology impression. Imaging shows: Punctate calculus in the bladder medial to the left UVJ 9 mm left renal calculi  Medications  I ordered medication including IV fluids for prerenal AKI, Zofran for nausea, morphine and Toradol for pain Reevaluation of the patient after medication shows that patient improved  Consultations: I requested consultation with Dr. Conley Simmonds, urology at 1702,  and discussed lab and imaging findings as well as pertinent plan - they recommend: flomax, pain  medication, strainer, close f/u in OP clinic.  Later spoke with Dr. Grandville Silos, urology attending who agrees with resident consultation and will see patient in outpatient clinic  ED Course: 56 year old male who presents emergency department with flank pain. Patient presents with flank pain likely secondary to renal colic from lnon-obstructed, non infected kidney stone. Given history, exam, and work up I have low suspicion for atypical appendicitis, genital torsion, acute  cholecystitis, AAA, infected obstructed stone, pyelonephritis, or other emergent intraabdominal pathology. Symptoms and UA indicate no infection. BMP with evidence of mild AKI.  Given 1 L IV fluids.  Pain treated in ED with morphine which mildly improved symptoms, Toradol which greatly improved symptoms.  Pain is likely coming from recently passed stone that is at UVJ.  He does have a 9 mm left renal calculi on that we will likely not pass.  He will follow-up with Dr. Grandville Silos in outpatient clinic regarding the stone and possible intervention.  In the meantime we will send patient home with Flomax, Toradol, Norco for severe pain.  Discussed results with him at bedside.  Discussed that he should return to the emergency department for fevers, inability urinate.  Discussed that he should use Flomax and Toradol first and then use Norco only for severe pain.  He verbalized understanding.  I have answered all of his questions at bedside.  Discussed findings with wife at bedside and answered all of her questions.  After consideration of the diagnostic results and the patients response to treatment, I feel that the patent would benefit from discharge. The patient has been appropriately medically screened and/or stabilized in the ED. I have low suspicion for any other emergent medical condition which would require further screening, evaluation or treatment in the ED or require inpatient management. The patient is overall well appearing and non-toxic in  appearance. They are hemodynamically stable at time of discharge.   Final Clinical Impression(s) / ED Diagnoses Final diagnoses:  Nephrolithiasis    Rx / DC Orders ED Discharge Orders          Ordered    tamsulosin (FLOMAX) 0.4 MG CAPS capsule  Daily after supper        08/26/21 1804    ketorolac (TORADOL) 10 MG tablet  Every 6 hours PRN        08/26/21 1804    HYDROcodone-acetaminophen (NORCO/VICODIN) 5-325 MG tablet  Every 6 hours PRN        08/26/21 1804              Mickie Hillier, PA-C 91/63/84 [id number]    Lianne Cure, DO 93/57/01 1628

## 2021-08-26 NOTE — Discharge Instructions (Addendum)
You were seen in the emergency department today for kidney stones.  Please call tomorrow to Dr. Biagio Borg office.  This is urologist.  And follow-up this week for your kidney stones.  In the meantime we are providing you with medication.  I am providing you with Flomax which helps relax the tubes in your urinary system to help pass the stone.  You will take this daily.  I provided you with Toradol which you can take every 6 hours as needed.  Please do not combine this with ibuprofen.  I have also provided you with a few doses of Norco which you can take for severe pain.  Please try Toradol prior to trying the Norco.  You have been prescribed a medication that is considered an opiate. Opiates are pain medications that should be used with caution. It is important that you do not drive while taking this medication as it can cause drowsiness and impaired reaction times. Do not mix this medication with benzodiazepine medications, Flexeril, trazodone or alcohol as this can cause respiratory depression. Additionally, opiates have addicting properties to them. Please use medication as prescribed by your provider.  Please return to the emergency department if you are unable to pee or you begin to have fever with your current symptoms.

## 2021-08-26 NOTE — ED Notes (Signed)
Patient is requesting pain medicine, states pain level is a 6.

## 2021-08-31 ENCOUNTER — Other Ambulatory Visit: Payer: Self-pay | Admitting: Urology

## 2021-08-31 ENCOUNTER — Encounter (HOSPITAL_BASED_OUTPATIENT_CLINIC_OR_DEPARTMENT_OTHER): Payer: Self-pay | Admitting: Urology

## 2021-08-31 NOTE — Progress Notes (Signed)
Talked with patient. Hx and meds reviewed. Clear liquids until 1230, Solids until 1030, talked about Insulin. Since he could eat in AM . He could take whole dose. But whatever he felt would best suit him. Either taking full dose or 1/2 dose the night before. Arrival time 1430. Wife (Amy) is the driver. Needs to bring CPAP with him.

## 2021-08-31 NOTE — H&P (Signed)
I have ureteral stone.  HPI: Devon Jackson is a 56 year-old male patient who was referred by Alma Friendly, NP who is here for ureteral stone.    Devon Jackson is a 56 yo male who was seen in the ER on 08/26/21 for left flank pain. A CT demonstrated a punctate stone either in the bladder or at the left UVJ with very mild dilation. He has a 88mm LLP stone as well. His Cr was 1.46 which was up from 0.99 in 10/22. PSA was 0.63 in 4/22. UA was clear. The pain was severe. He may have had some nausea but no vomiting. he had no voiding symptoms. He saw Dr. Alinda Money in 2016 for minimal microhematuria. IPSS is 0-1 with occasional nocturia. He is a diabetic and has 3+ glucose today but the urine is otherwise clear. He has some dull left low back pain. He has ED and uses Cialis $RemoveBef'20mg'CDiQtdwDCK$  with some success but he has low back pain if he takes it too often.      ALLERGIES: Crestor TABS Peanuts statins    MEDICATIONS: Aspirin 81 mg tablet,chewable  Cialis  Fenofibrate  Metformin Er Gastric 1,000 mg tablet, er gastric retention 24 hr  Wellbutrin Sr     GU PSH: None     PSH Notes: Shoulder Arthroscopy With Rotator Cuff Repair, Appendectomy, Septoplasty   NON-GU PSH: Appendectomy - 2008 Cholecystectomy (laparoscopic) - 2021 Deviated Septum Surgery - 2008 Rotator cuff surgery - 2016     GU PMH: Microscopic hematuria, Asymptomatic microscopic hematuria - 2017    NON-GU PMH: Encounter for general adult medical examination without abnormal findings, Encounter for preventive health examination - 2016 Personal history of other diseases of the musculoskeletal system and connective tissue, History of gout - 2016 Personal history of other diseases of the nervous system and sense organs, History of sleep apnea - 2016 Personal history of other endocrine, nutritional and metabolic disease, History of diabetes mellitus - 2016 Pure hyperglyceridemia, High triglycerides - 2016 Unspecified abdominal hernia without  obstruction or gangrene, Hernia - 2014 Anxiety Arrhythmia Depression Diabetes Type 2 GERD Gout Sleep Apnea    FAMILY HISTORY: Death In The Family Father - Other Death In The Family Mother - Other Diabetes - Father Heart Disease - Father No pertinent family history - Runs In Family   SOCIAL HISTORY: Marital Status: Married Preferred Language: English; Ethnicity: Not Hispanic Or Latino; Race: White Current Smoking Status: Patient has never smoked.   Tobacco Use Assessment Completed: Used Tobacco in last 30 days? Does not use smokeless tobacco. Has never drank.  Drinks 4+ caffeinated drinks per day.     Notes: Married, Occupation, Never a smoker, Death In The Family Father, Tobacco Use   REVIEW OF SYSTEMS:    GU Review Male:   Patient reports get up at night to urinate and erection problems. Patient denies frequent urination, hard to postpone urination, burning/ pain with urination, leakage of urine, stream starts and stops, trouble starting your stream, have to strain to urinate , and penile pain.  Gastrointestinal (Upper):   Patient denies nausea, vomiting, and indigestion/ heartburn.  Gastrointestinal (Lower):   Patient reports diarrhea. Patient denies constipation.  Constitutional:   Patient reports fatigue. Patient denies fever, night sweats, and weight loss.  Skin:   Patient denies skin rash/ lesion and itching.  Eyes:   Patient reports blurred vision. Patient denies double vision.  Ears/ Nose/ Throat:   Patient denies sore throat and sinus problems.  Hematologic/Lymphatic:   Patient denies  swollen glands and easy bruising.  Cardiovascular:   Patient denies leg swelling and chest pains.  Respiratory:   Patient reports cough and shortness of breath.   Endocrine:   Patient denies excessive thirst.  Musculoskeletal:   Patient reports back pain. Patient denies joint pain.  Neurological:   Patient reports headaches. Patient denies dizziness.  Psychologic:   Patient reports  anxiety. Patient denies depression.   Notes: Blood in rine    VITAL SIGNS:      08/31/2021 08:22 AM  Weight 231 lb / 104.78 kg  Height 69 in / 175.26 cm  BP 159/75 mmHg  Heart Rate 89 /min  BMI 34.1 kg/m   MULTI-SYSTEM PHYSICAL EXAMINATION:    Constitutional: Obese. No physical deformities. Normally developed. Good grooming.   Neck: Neck symmetrical, not swollen. Normal tracheal position.  Respiratory: Normal breath sounds. No labored breathing, no use of accessory muscles.   Cardiovascular: Regular rate and rhythm. No murmur, no gallop.   Skin: No paleness, no jaundice, no cyanosis. No lesion, no ulcer, no rash.  Neurologic / Psychiatric: Oriented to time, oriented to place, oriented to person. No depression, no anxiety, no agitation.  Gastrointestinal: Obese abdomen. No mass, no tenderness, no rigidity.   Musculoskeletal: Normal gait and station of head and neck.     Complexity of Data:  Lab Test Review:   PSA, BMP  Records Review:   AUA Symptom Score, Previous Hospital Records, Previous Patient Records  Urine Test Review:   Urinalysis  X-Ray Review: [facility] Protocol: Reviewed Films. Reviewed Report. Discussed With Patient.     PROCEDURES:         KUB - [id number]  A single view of the abdomen is obtained. there is an 8 x 25m LLP stone consistent with the stone on CT. No ureteral stones are noted. He has no bone, gas or soft tissue abnormalities.       . Patient confirmed No Neulasta OnPro Device.           Urinalysis Dipstick Dipstick Cont'd  Color: Yellow Bilirubin: Neg mg/dL  Appearance: Clear Ketones: Neg mg/dL  Specific Gravity: 1.025 Blood: Neg ery/uL  pH: 5.5 Protein: Neg mg/dL  Glucose: 3+ mg/dL Urobilinogen: 0.2 mg/dL    Nitrites: Neg    Leukocyte Esterase: Neg leu/uL    ASSESSMENT:      ICD-10 Details  1 GU:   Renal calculus - N20.0 Left, Chronic, Threat to Bodily Function - He has a 17mLLP stone but appears to have passed the distal stone. I  discussed MET, ESWL and URS and he would like to proceed with ESWL for the stone. I reviewed the risks of ESWL including bleeding, infection, injury to the kidney or adjacent structures, failure to fragment the stone, need for ancillary procedures, thrombotic events, cardiac arrhythmias and sedation complications.     2   Ureteral calculus - N2L39.0cute, Uncomplicated, Resolved  3   ED due to arterial insufficiency - N52.01 Chronic, Worsening - We discussed options for treating the ED including FirmEST and combination oral therapy. I will have him use 1/2 tab of sildenafil 10075mith 1/2 tab of cialis 60m60mnstructions and side effects reviewed.    PLAN:            Medications New Meds: Sildenafil Citrate 100 mg tablet 1 tablet PO Daily PRN   #7  11 Refill(s)  [facility]  Address:  [address]  [place] [address]  Phone:  [phone]  Fax:  [phone]            Orders X-Rays: KUB          Schedule Return Visit/Planned Activity: Next Available Appointment - Schedule Surgery  Procedure: Unspecified Date - ESWL - 50590, left Notes: Next available

## 2021-09-02 ENCOUNTER — Encounter (HOSPITAL_BASED_OUTPATIENT_CLINIC_OR_DEPARTMENT_OTHER)
Admission: RE | Disposition: A | Discharge status: Home / Self Care | Payer: Self-pay | Source: Ambulatory Visit | Attending: Urology

## 2021-09-02 ENCOUNTER — Ambulatory Visit (HOSPITAL_BASED_OUTPATIENT_CLINIC_OR_DEPARTMENT_OTHER)
Admission: RE | Admit: 2021-09-02 | Discharge: 2021-09-02 | Disposition: A | Discharge status: Home / Self Care | Source: Ambulatory Visit | Attending: Urology | Admitting: Urology

## 2021-09-02 ENCOUNTER — Other Ambulatory Visit: Payer: Self-pay

## 2021-09-02 ENCOUNTER — Encounter (HOSPITAL_BASED_OUTPATIENT_CLINIC_OR_DEPARTMENT_OTHER): Payer: Self-pay | Admitting: Urology

## 2021-09-02 ENCOUNTER — Ambulatory Visit (HOSPITAL_COMMUNITY)

## 2021-09-02 DIAGNOSIS — N200 Calculus of kidney: Secondary | ICD-10-CM

## 2021-09-02 DIAGNOSIS — N202 Calculus of kidney with calculus of ureter: Secondary | ICD-10-CM | POA: Insufficient documentation

## 2021-09-02 DIAGNOSIS — N5201 Erectile dysfunction due to arterial insufficiency: Secondary | ICD-10-CM | POA: Insufficient documentation

## 2021-09-02 DIAGNOSIS — Z79899 Other long term (current) drug therapy: Secondary | ICD-10-CM | POA: Diagnosis not present

## 2021-09-02 DIAGNOSIS — M545 Low back pain, unspecified: Secondary | ICD-10-CM | POA: Diagnosis not present

## 2021-09-02 DIAGNOSIS — E119 Type 2 diabetes mellitus without complications: Secondary | ICD-10-CM | POA: Diagnosis not present

## 2021-09-02 DIAGNOSIS — R351 Nocturia: Secondary | ICD-10-CM | POA: Insufficient documentation

## 2021-09-02 DIAGNOSIS — I771 Stricture of artery: Secondary | ICD-10-CM | POA: Diagnosis not present

## 2021-09-02 HISTORY — DX: Sleep apnea, unspecified: G47.30

## 2021-09-02 HISTORY — PX: EXTRACORPOREAL SHOCK WAVE LITHOTRIPSY: SHX1557

## 2021-09-02 LAB — GLUCOSE, CAPILLARY: Glucose-Capillary: 108 mg/dL — ABNORMAL HIGH (ref 70–99)

## 2021-09-02 SURGERY — LITHOTRIPSY, ESWL
Anesthesia: LOCAL | Laterality: Left

## 2021-09-02 MED ORDER — DIAZEPAM 5 MG PO TABS
ORAL_TABLET | ORAL | Status: AC
  Filled 2021-09-02: qty 1

## 2021-09-02 MED ORDER — DIPHENHYDRAMINE HCL 25 MG PO CAPS
ORAL_CAPSULE | ORAL | Status: AC
  Filled 2021-09-02: qty 1

## 2021-09-02 MED ORDER — DIAZEPAM 5 MG PO TABS
10.0000 mg | ORAL_TABLET | ORAL | Status: AC
  Administered 2021-09-02: 10 mg via ORAL

## 2021-09-02 MED ORDER — CIPROFLOXACIN HCL 500 MG PO TABS
500.0000 mg | ORAL_TABLET | ORAL | Status: AC
  Administered 2021-09-02: 500 mg via ORAL

## 2021-09-02 MED ORDER — DIPHENHYDRAMINE HCL 25 MG PO CAPS
25.0000 mg | ORAL_CAPSULE | ORAL | Status: AC
  Administered 2021-09-02: 25 mg via ORAL

## 2021-09-02 MED ORDER — SODIUM CHLORIDE 0.9 % IV SOLN: INTRAVENOUS | Status: DC

## 2021-09-02 MED ORDER — CIPROFLOXACIN HCL 500 MG PO TABS
ORAL_TABLET | ORAL | Status: AC
  Filled 2021-09-02: qty 1

## 2021-09-02 NOTE — Discharge Instructions (Signed)
I have reviewed discharge instructions in detail with the patient. They will follow-up with me or their physician as scheduled. My nurse will also be calling the patients as per protocol.   

## 2021-09-03 ENCOUNTER — Encounter (HOSPITAL_BASED_OUTPATIENT_CLINIC_OR_DEPARTMENT_OTHER): Payer: Self-pay | Admitting: Urology

## 2021-09-03 ENCOUNTER — Other Ambulatory Visit (HOSPITAL_COMMUNITY): Payer: Self-pay

## 2021-10-04 ENCOUNTER — Other Ambulatory Visit: Payer: Self-pay | Admitting: Primary Care

## 2021-10-04 DIAGNOSIS — R0602 Shortness of breath: Secondary | ICD-10-CM

## 2021-10-13 ENCOUNTER — Other Ambulatory Visit: Payer: Self-pay | Admitting: Primary Care

## 2021-10-13 DIAGNOSIS — E782 Mixed hyperlipidemia: Secondary | ICD-10-CM

## 2021-11-12 ENCOUNTER — Encounter: Payer: Self-pay | Admitting: Primary Care

## 2021-11-12 ENCOUNTER — Ambulatory Visit (INDEPENDENT_AMBULATORY_CARE_PROVIDER_SITE_OTHER): Admitting: Primary Care

## 2021-11-12 VITALS — BP 138/80 | HR 97 | Ht 68.0 in | Wt 243.0 lb

## 2021-11-12 DIAGNOSIS — Z23 Encounter for immunization: Secondary | ICD-10-CM

## 2021-11-12 DIAGNOSIS — Z794 Long term (current) use of insulin: Secondary | ICD-10-CM | POA: Diagnosis not present

## 2021-11-12 DIAGNOSIS — E1165 Type 2 diabetes mellitus with hyperglycemia: Secondary | ICD-10-CM

## 2021-11-12 LAB — POCT GLYCOSYLATED HEMOGLOBIN (HGB A1C): Hemoglobin A1C: 7.2 % — AB (ref 4.0–5.6)

## 2021-11-12 MED ORDER — TRULICITY 0.75 MG/0.5ML ~~LOC~~ SOAJ: 0.7500 mg | SUBCUTANEOUS | 1 refills | Status: DC

## 2021-11-12 NOTE — Patient Instructions (Signed)
Start dulaglutide (Trulicity) 7.34 mg for diabetes.  Inject 0.75 mg into your skin once weekly. ? ?Continue your insulin and metformin as prescribed. ? ?Please notify me if you start to see glucose levels that are consistently below 80. ? ?Schedule a lab only appointment for 3 months for repeat A1c. ? ?Schedule your physical for 6 months. ? ?It was a pleasure to see you today! ? ? ?

## 2021-11-12 NOTE — Assessment & Plan Note (Addendum)
Improved with A1C of 7.2 today! ? ?Encouraged him to continue working on his diet. ?Encouraged regular exercise ? ?Continue metformin 1000 mg twice daily, Levemir insulin 60 units daily.  ? ?Add Trulicity 0.37 mg weekly.  We discussed potential side effects and instructions for use. ? ?Foot exam today. ?Intolerant to statins ?Urine microalbumin up-to-date. ? ?Repeat A1c in 3 months, lab only appointment. ?Follow-up in 6 months. ?

## 2021-11-12 NOTE — Addendum Note (Signed)
Addended by: Tyrone Apple on: 11/12/2021 02:32 PM ? ? Modules accepted: Orders ? ?

## 2021-11-12 NOTE — Progress Notes (Signed)
? ?Subjective:  ? ? Patient ID: Devon Jackson, male    DOB: Feb 25, 1966, 56 y.o.   MRN: [id number] ? ?HPI ? ?Devon Jackson is a very pleasant 56 y.o. male with a history of type 2 diabetes, OSA, erectile dysfunction, hyperlipidemia,who presents today for follow-up of diabetes.  He is due for his second Shingrix vaccine today. ? ?Current medications include: Metformin 1000 mg twice daily, Levemir 60 units at bedtime.  ? ?He is interested in trying Trulicity. ? ?He is checking his blood glucose 1 times daily and is getting readings of 150's. He checks in the AM fasting.  ? ?Last A1C: 7.6 in October 2022, 7.2 today ?Last Eye Exam: Up-to-date ?Last Foot Exam: Due ?Pneumonia Vaccination: 2017 ?Urine Microalbumin: Up-to-date ?Statin: Intolerant. ? ?Dietary changes since last visit: He has limited his intake of pasta and other carbs. He eats fast food everyday at lunch, fried food, sometimes vegetables.  ? ?He drinks mostly diet pepsi.  ? ?Exercise: Active ? ?BP Readings from Last 3 Encounters:  ?11/12/21 138/80  ?09/02/21 (!) 142/101  ?08/26/21 123/80  ? ? ? ? ? ?Review of Systems  ?Eyes:  Negative for visual disturbance.  ?Cardiovascular:  Negative for chest pain.  ?Neurological:  Negative for dizziness and numbness.  ? ?   ? ? ?Past Medical History:  ?Diagnosis Date  ? Allergic rhinitis   ? Frequent headaches   ? Gout   ? Hypertriglyceridemia   ? Sleep apnea   ? Type 2 diabetes mellitus ([place])   ? ? ?Social History  ? ?Socioeconomic History  ? Marital status: Married  ?  Spouse name: Not on file  ? Number of children: Not on file  ? Years of education: Not on file  ? Highest education level: Not on file  ?Occupational History  ? Not on file  ?Tobacco Use  ? Smoking status: Never  ? Smokeless tobacco: Never  ?Vaping Use  ? Vaping Use: Never used  ?Substance and Sexual Activity  ? Alcohol use: Yes  ?  Alcohol/week: 0.0 standard drinks  ?  Comment: rarely  ? Drug use: No  ? Sexual activity: Yes  ?Other Topics Concern  ?  Not on file  ?Social History Narrative  ? Married.  ? 3 children.  ? Works as a Therapist, nutritional.  ? Enjoys relaxing, spending time outdoors.  ? ?Social Determinants of Health  ? ?Financial Resource Strain: Not on file  ?Food Insecurity: Not on file  ?Transportation Needs: Not on file  ?Physical Activity: Not on file  ?Stress: Not on file  ?Social Connections: Not on file  ?Intimate Partner Violence: Not on file  ? ? ?Past Surgical History:  ?Procedure Laterality Date  ? APPENDECTOMY    ? EXTRACORPOREAL SHOCK WAVE LITHOTRIPSY Left 09/02/2021  ? Procedure: LEFT EXTRACORPOREAL SHOCK WAVE LITHOTRIPSY (ESWL);  Surgeon: Bjorn Loser, MD;  Location: [facility];  Service: Urology;  Laterality: Left;  ? LITHOTRIPSY  08/2021  ? POLYPECTOMY    ? ROTATOR CUFF REPAIR  07/26/2007  ? SEPTOPLASTY    ? ? ?Family History  ?Problem Relation Age of Onset  ? Alcohol abuse Mother   ? Diabetes Father   ? Breast cancer Paternal Grandmother   ? Lung cancer Paternal Grandfather   ? ? ?Allergies  ?Allergen Reactions  ? Bee Pollen Anaphylaxis  ? Bee Venom Anaphylaxis  ? Statins Other (See Comments)  ?  Memory issues  ? ? ?Current Outpatient Medications on File Prior to  Visit  ?Medication Sig Dispense Refill  ? albuterol (VENTOLIN HFA) 108 (90 Base) MCG/ACT inhaler USE 2 INHALATIONS EVERY 6 HOURS AS NEEDED 8 g 0  ? BD PEN NEEDLE NANO U/F 32G X 4 MM MISC Use to inject insulin. 100 each 3  ? blood glucose meter kit and supplies KIT Dispense based on patient and insurance preference. Use up to 3 times daily as directed. (Dx is E11.9). 1 each 0  ? buPROPion (WELLBUTRIN XL) 150 MG 24 hr tablet TAKE 1 TABLET DAILY FOR DEPRESSION 90 tablet 2  ? clonazePAM (KLONOPIN) 0.5 MG tablet Take 1 tablet (0.5 mg total) by mouth as needed for anxiety. 15 tablet 0  ? clotrimazole-betamethasone (LOTRISONE) cream Apply topically.    ? cyclobenzaprine (FLEXERIL) 10 MG tablet Take 1 tablet by mouth once to twice daily as needed for severe  headaches. 30 tablet 0  ? cyclobenzaprine (FLEXERIL) 10 MG tablet Take 1 tablet by mouth 3 (three) times daily as needed.    ? DEXILANT 60 MG capsule Take 1 capsule (60 mg total) by mouth daily. For heartburn. 90 capsule 0  ? EPINEPHrine 0.3 mg/0.3 mL IJ SOAJ injection INJECT 0.3 ML (0.3 MG TOTAL) INTO THE MUSCLE AS NEEDED FOR ANAPHYLAXIS 2 each 0  ? fenofibrate (TRICOR) 145 MG tablet TAKE 1 TABLET DAILY FOR CHOLESTEROL 90 tablet 1  ? glucose blood (FREESTYLE LITE) test strip USE UP TO THREE TIMES A DAY AS DIRECTED 300 each 2  ? HYDROcodone-acetaminophen (NORCO/VICODIN) 5-325 MG tablet Take 1 tablet by mouth every 6 (six) hours as needed.    ? insulin detemir (LEVEMIR FLEXTOUCH) 100 UNIT/ML FlexPen INJECT 60 UNITS UNDER THE SKIN AT BEDTIME 60 mL 1  ? ketorolac (TORADOL) 10 MG tablet Take 1 tablet (10 mg total) by mouth every 6 (six) hours as needed. 20 tablet 0  ? Lancets (FREESTYLE) lancets USE TO TEST BLOOD SUGAR 2 TO 3 TIMES DAILY AND AS DIRECTED 300 each 1  ? metFORMIN (GLUCOPHAGE) 1000 MG tablet TAKE 1 TABLET TWICE A DAY WITH MEALS FOR DIABETES 180 tablet 3  ? montelukast (SINGULAIR) 10 MG tablet TAKE 1 TABLET AT BEDTIME FOR ALLERGIES 90 tablet 2  ? sildenafil (VIAGRA) 100 MG tablet     ? tadalafil (CIALIS) 20 MG tablet TAKE 1 TABLET 30 MINUTES PRIOR TO INTERCOURSE AS NEEDED 30 tablet 0  ? tamsulosin (FLOMAX) 0.4 MG CAPS capsule Take 1 capsule (0.4 mg total) by mouth daily after supper. 30 capsule 0  ? traZODone (DESYREL) 50 MG tablet TAKE 1 TABLET AT BEDTIME AS NEEDED FOR SLEEP 90 tablet 2  ? ?No current facility-administered medications on file prior to visit.  ? ? ?BP 138/80   Pulse 97   Ht 5' 8" (1.727 m)   Wt 243 lb (110.2 kg)   SpO2 96%   BMI 36.95 kg/m?  ?Objective:  ? Physical Exam ?Cardiovascular:  ?   Rate and Rhythm: Normal rate and regular rhythm.  ?Pulmonary:  ?   Effort: Pulmonary effort is normal.  ?   Breath sounds: Normal breath sounds. No wheezing or rales.  ?Musculoskeletal:  ?    Cervical back: Neck supple.  ?Skin: ?   General: Skin is warm and dry.  ?Neurological:  ?   Mental Status: He is alert and oriented to person, place, and time.  ? ? ? ? ? ?   ?Assessment & Plan:  ? ? ? ? ?This visit occurred during the SARS-CoV-2 public health emergency.  Safety protocols  were in place, including screening questions prior to the visit, additional usage of staff PPE, and extensive cleaning of exam room while observing appropriate contact time as indicated for disinfecting solutions.  ?

## 2021-11-18 ENCOUNTER — Other Ambulatory Visit: Payer: Self-pay | Admitting: Family Medicine

## 2021-11-18 ENCOUNTER — Ambulatory Visit: Admitting: Primary Care

## 2021-11-18 DIAGNOSIS — E119 Type 2 diabetes mellitus without complications: Secondary | ICD-10-CM

## 2021-12-20 ENCOUNTER — Other Ambulatory Visit: Payer: Self-pay | Admitting: Primary Care

## 2021-12-20 DIAGNOSIS — E119 Type 2 diabetes mellitus without complications: Secondary | ICD-10-CM

## 2022-01-26 ENCOUNTER — Other Ambulatory Visit: Payer: Self-pay | Admitting: Primary Care

## 2022-01-26 DIAGNOSIS — E1165 Type 2 diabetes mellitus with hyperglycemia: Secondary | ICD-10-CM

## 2022-01-27 ENCOUNTER — Other Ambulatory Visit: Payer: Self-pay | Admitting: Primary Care

## 2022-01-27 DIAGNOSIS — Z91030 Bee allergy status: Secondary | ICD-10-CM

## 2022-02-11 ENCOUNTER — Other Ambulatory Visit (INDEPENDENT_AMBULATORY_CARE_PROVIDER_SITE_OTHER)

## 2022-02-11 DIAGNOSIS — E1165 Type 2 diabetes mellitus with hyperglycemia: Secondary | ICD-10-CM

## 2022-02-11 DIAGNOSIS — Z794 Long term (current) use of insulin: Secondary | ICD-10-CM | POA: Diagnosis not present

## 2022-02-11 LAB — POCT GLYCOSYLATED HEMOGLOBIN (HGB A1C): Hemoglobin A1C: 7.2 % — AB (ref 4.0–5.6)

## 2022-02-14 DIAGNOSIS — Z794 Long term (current) use of insulin: Secondary | ICD-10-CM

## 2022-02-15 MED ORDER — TRULICITY 1.5 MG/0.5ML ~~LOC~~ SOAJ: 1.5000 mg | SUBCUTANEOUS | 0 refills | Status: DC

## 2022-02-16 ENCOUNTER — Other Ambulatory Visit: Payer: Self-pay | Admitting: Family Medicine

## 2022-02-16 DIAGNOSIS — Z794 Long term (current) use of insulin: Secondary | ICD-10-CM

## 2022-02-16 DIAGNOSIS — E119 Type 2 diabetes mellitus without complications: Secondary | ICD-10-CM

## 2022-02-20 ENCOUNTER — Other Ambulatory Visit: Payer: Self-pay | Admitting: Primary Care

## 2022-02-20 DIAGNOSIS — E1165 Type 2 diabetes mellitus with hyperglycemia: Secondary | ICD-10-CM

## 2022-02-20 DIAGNOSIS — Z91030 Bee allergy status: Secondary | ICD-10-CM

## 2022-03-25 ENCOUNTER — Other Ambulatory Visit: Payer: Self-pay | Admitting: Primary Care

## 2022-03-25 DIAGNOSIS — G4700 Insomnia, unspecified: Secondary | ICD-10-CM

## 2022-04-04 LAB — HM DIABETES EYE EXAM

## 2022-04-11 ENCOUNTER — Other Ambulatory Visit: Payer: Self-pay | Admitting: Primary Care

## 2022-04-11 DIAGNOSIS — E782 Mixed hyperlipidemia: Secondary | ICD-10-CM

## 2022-04-22 ENCOUNTER — Other Ambulatory Visit: Payer: Self-pay | Admitting: Primary Care

## 2022-04-22 DIAGNOSIS — F320 Major depressive disorder, single episode, mild: Secondary | ICD-10-CM

## 2022-04-22 DIAGNOSIS — E1165 Type 2 diabetes mellitus with hyperglycemia: Secondary | ICD-10-CM

## 2022-04-26 ENCOUNTER — Encounter: Payer: Self-pay | Admitting: Primary Care

## 2022-05-17 ENCOUNTER — Other Ambulatory Visit: Payer: Self-pay | Admitting: Primary Care

## 2022-05-17 DIAGNOSIS — Z794 Long term (current) use of insulin: Secondary | ICD-10-CM

## 2022-05-20 ENCOUNTER — Other Ambulatory Visit: Payer: Self-pay | Admitting: Primary Care

## 2022-05-20 DIAGNOSIS — Z794 Long term (current) use of insulin: Secondary | ICD-10-CM

## 2022-05-20 DIAGNOSIS — E119 Type 2 diabetes mellitus without complications: Secondary | ICD-10-CM

## 2022-05-24 ENCOUNTER — Other Ambulatory Visit: Payer: Self-pay | Admitting: Primary Care

## 2022-05-24 ENCOUNTER — Telehealth: Payer: Self-pay | Admitting: Primary Care

## 2022-05-24 ENCOUNTER — Encounter: Payer: Self-pay | Admitting: Primary Care

## 2022-05-24 ENCOUNTER — Ambulatory Visit (INDEPENDENT_AMBULATORY_CARE_PROVIDER_SITE_OTHER): Admitting: Primary Care

## 2022-05-24 VITALS — BP 130/80 | HR 91 | Temp 97.3°F | Ht 68.0 in | Wt 229.0 lb

## 2022-05-24 DIAGNOSIS — G4700 Insomnia, unspecified: Secondary | ICD-10-CM

## 2022-05-24 DIAGNOSIS — Z125 Encounter for screening for malignant neoplasm of prostate: Secondary | ICD-10-CM | POA: Diagnosis not present

## 2022-05-24 DIAGNOSIS — N5201 Erectile dysfunction due to arterial insufficiency: Secondary | ICD-10-CM

## 2022-05-24 DIAGNOSIS — Z794 Long term (current) use of insulin: Secondary | ICD-10-CM | POA: Diagnosis not present

## 2022-05-24 DIAGNOSIS — F3342 Major depressive disorder, recurrent, in full remission: Secondary | ICD-10-CM

## 2022-05-24 DIAGNOSIS — J309 Allergic rhinitis, unspecified: Secondary | ICD-10-CM

## 2022-05-24 DIAGNOSIS — E1165 Type 2 diabetes mellitus with hyperglycemia: Secondary | ICD-10-CM

## 2022-05-24 DIAGNOSIS — E119 Type 2 diabetes mellitus without complications: Secondary | ICD-10-CM

## 2022-05-24 DIAGNOSIS — Z23 Encounter for immunization: Secondary | ICD-10-CM

## 2022-05-24 DIAGNOSIS — G4733 Obstructive sleep apnea (adult) (pediatric): Secondary | ICD-10-CM

## 2022-05-24 DIAGNOSIS — R519 Headache, unspecified: Secondary | ICD-10-CM

## 2022-05-24 DIAGNOSIS — Z91030 Bee allergy status: Secondary | ICD-10-CM

## 2022-05-24 DIAGNOSIS — E782 Mixed hyperlipidemia: Secondary | ICD-10-CM | POA: Diagnosis not present

## 2022-05-24 DIAGNOSIS — Z0000 Encounter for general adult medical examination without abnormal findings: Secondary | ICD-10-CM

## 2022-05-24 DIAGNOSIS — K219 Gastro-esophageal reflux disease without esophagitis: Secondary | ICD-10-CM

## 2022-05-24 LAB — COMPREHENSIVE METABOLIC PANEL
ALT: 14 U/L (ref 0–53)
AST: 11 U/L (ref 0–37)
Albumin: 4.5 g/dL (ref 3.5–5.2)
Alkaline Phosphatase: 43 U/L (ref 39–117)
BUN: 18 mg/dL (ref 6–23)
CO2: 29 mEq/L (ref 19–32)
Calcium: 9.9 mg/dL (ref 8.4–10.5)
Chloride: 101 mEq/L (ref 96–112)
Creatinine, Ser: 1.02 mg/dL (ref 0.40–1.50)
GFR: 81.65 mL/min (ref >60.00)
Glucose, Bld: 115 mg/dL — ABNORMAL HIGH (ref 70–99)
Potassium: 4.1 mEq/L (ref 3.5–5.1)
Sodium: 138 mEq/L (ref 135–145)
Total Bilirubin: 0.6 mg/dL (ref 0.2–1.2)
Total Protein: 6.7 g/dL (ref 6.0–8.3)

## 2022-05-24 LAB — LIPID PANEL
Cholesterol: 165 mg/dL (ref 0–200)
HDL: 35.3 mg/dL — ABNORMAL LOW (ref >39.00)
LDL Cholesterol: 102 mg/dL — ABNORMAL HIGH (ref 0–99)
NonHDL: 130.11
Total CHOL/HDL Ratio: 5
Triglycerides: 139 mg/dL (ref 0.0–149.0)
VLDL: 27.8 mg/dL (ref 0.0–40.0)

## 2022-05-24 LAB — MICROALBUMIN / CREATININE URINE RATIO
Creatinine,U: 64.8 mg/dL
Microalb Creat Ratio: 1.1 mg/g (ref 0.0–30.0)
Microalb, Ur: 0.7 mg/dL (ref 0.0–1.9)

## 2022-05-24 LAB — HEMOGLOBIN A1C: Hgb A1c MFr Bld: 7 % — ABNORMAL HIGH (ref 4.6–6.5)

## 2022-05-24 LAB — PSA: PSA: 0.65 ng/mL (ref 0.10–4.00)

## 2022-05-24 MED ORDER — LEVEMIR FLEXTOUCH 100 UNIT/ML ~~LOC~~ SOPN: 20.0000 [IU] | PEN_INJECTOR | Freq: Every day | SUBCUTANEOUS | 0 refills | Status: DC

## 2022-05-24 MED ORDER — DEXCOM G7 SENSOR MISC: 3 refills | Status: DC

## 2022-05-24 MED ORDER — TRULICITY 3 MG/0.5ML ~~LOC~~ SOAJ: 3.0000 mg | SUBCUTANEOUS | 1 refills | Status: DC

## 2022-05-24 NOTE — Patient Instructions (Signed)
Stop by the lab prior to leaving today. I will notify you of your results once received.   Use Dexcom as discussed.  Please schedule a follow up visit for 6 months for a diabetes check.  It was a pleasure to see you today!  Preventive Care 57-57 Years Old, Male Preventive care refers to lifestyle choices and visits with your health care provider that can promote health and wellness. Preventive care visits are also called wellness exams. What can I expect for my preventive care visit? Counseling During your preventive care visit, your health care provider may ask about your: Medical history, including: Past medical problems. Family medical history. Current health, including: Emotional well-being. Home life and relationship well-being. Sexual activity. Lifestyle, including: Alcohol, nicotine or tobacco, and drug use. Access to firearms. Diet, exercise, and sleep habits. Safety issues such as seatbelt and bike helmet use. Sunscreen use. Work and work Statistician. Physical exam Your health care provider will check your: Height and weight. These may be used to calculate your BMI (body mass index). BMI is a measurement that tells if you are at a healthy weight. Waist circumference. This measures the distance around your waistline. This measurement also tells if you are at a healthy weight and may help predict your risk of certain diseases, such as type 2 diabetes and high blood pressure. Heart rate and blood pressure. Body temperature. Skin for abnormal spots. What immunizations do I need?  Vaccines are usually given at various ages, according to a schedule. Your health care provider will recommend vaccines for you based on your age, medical history, and lifestyle or other factors, such as travel or where you work. What tests do I need? Screening Your health care provider may recommend screening tests for certain conditions. This may include: Lipid and cholesterol levels. Diabetes  screening. This is done by checking your blood sugar (glucose) after you have not eaten for a while (fasting). Hepatitis B test. Hepatitis C test. HIV (human immunodeficiency virus) test. STI (sexually transmitted infection) testing, if you are at risk. Lung cancer screening. Prostate cancer screening. Colorectal cancer screening. Talk with your health care provider about your test results, treatment options, and if necessary, the need for more tests. Follow these instructions at home: Eating and drinking  Eat a diet that includes fresh fruits and vegetables, whole grains, lean protein, and low-fat dairy products. Take vitamin and mineral supplements as recommended by your health care provider. Do not drink alcohol if your health care provider tells you not to drink. If you drink alcohol: Limit how much you have to 0-2 drinks a day. Know how much alcohol is in your drink. In the [place] one drink equals one 12 oz bottle of beer (355 mL), one 5 oz glass of wine (148 mL), or one 1 oz glass of hard liquor (44 mL). Lifestyle Brush your teeth every morning and night with fluoride toothpaste. Floss one time each day. Exercise for at least 30 minutes 5 or more days each week. Do not use any products that contain nicotine or tobacco. These products include cigarettes, chewing tobacco, and vaping devices, such as e-cigarettes. If you need help quitting, ask your health care provider. Do not use drugs. If you are sexually active, practice safe sex. Use a condom or other form of protection to prevent STIs. Take aspirin only as told by your health care provider. Make sure that you understand how much to take and what form to take. Work with your health care provider to  find out whether it is safe and beneficial for you to take aspirin daily. Find healthy ways to manage stress, such as: Meditation, yoga, or listening to music. Journaling. Talking to a trusted person. Spending time with friends and  family. Minimize exposure to UV radiation to reduce your risk of skin cancer. Safety Always wear your seat belt while driving or riding in a vehicle. Do not drive: If you have been drinking alcohol. Do not ride with someone who has been drinking. When you are tired or distracted. While texting. If you have been using any mind-altering substances or drugs. Wear a helmet and other protective equipment during sports activities. If you have firearms in your house, make sure you follow all gun safety procedures. What's next? Go to your health care provider once a year for an annual wellness visit. Ask your health care provider how often you should have your eyes and teeth checked. Stay up to date on all vaccines. This information is not intended to replace advice given to you by your health care provider. Make sure you discuss any questions you have with your health care provider. Document Revised: 01/06/2021 Document Reviewed: 01/06/2021 Elsevier Patient Education  [place].

## 2022-05-24 NOTE — Assessment & Plan Note (Signed)
Controlled.  Continue bupropion XL 150 mg daily.

## 2022-05-24 NOTE — Assessment & Plan Note (Signed)
Immunizations UTD. Influenza vaccine provided today. Colonoscopy UTD, due 2025. PSA due and pending.  Discussed the importance of a healthy diet and regular exercise in order for weight loss, and to reduce the risk of further co-morbidity.  Exam stable. Labs pending.  Follow up in 1 year for repeat physical.

## 2022-05-24 NOTE — Progress Notes (Addendum)
Subjective:    Patient ID: Devon Jackson, male    DOB: [birth year], 56 y.o.   MRN: [id number]  HPI  Devon Jackson is a very pleasant 56 y.o. male who presents today for complete physical and follow up of chronic conditions.  (Addendum 07/01/22: patient is checking glucose readings 5 times daily and is getting readings in the 130s on average.  He denies hypoglycemic events below 54.  He is injecting Levemir 25 units daily)  Immunizations: -Tetanus: 2018 -Influenza: Due today -Shingles: Completed Shingrix series -Pneumonia: Pneumovax 23 in 2017  Diet: [place].  Exercise: No regular exercise.  Eye exam: Completes annually  Dental exam: Completes semi-annually   Colonoscopy: Completed in 2020, due 2025  PSA: Due  BP Readings from Last 3 Encounters:  05/24/22 130/80  11/12/21 138/80  09/02/21 (!) 142/101    Wt Readings from Last 3 Encounters:  05/24/22 229 lb (103.9 kg)  11/12/21 243 lb (110.2 kg)  09/02/21 231 lb (104.8 kg)        Review of Systems  Constitutional:  Negative for unexpected weight change.  HENT:  Negative for rhinorrhea.   Respiratory:  Negative for cough and shortness of breath.   Cardiovascular:  Negative for chest pain.  Gastrointestinal:  Negative for constipation and diarrhea.  Genitourinary:  Negative for difficulty urinating.  Musculoskeletal:  Negative for arthralgias and myalgias.  Skin:  Negative for rash.  Allergic/Immunologic: Negative for environmental allergies.  Neurological:  Positive for headaches. Negative for dizziness.  Psychiatric/Behavioral:  The patient is not nervous/anxious.          Past Medical History:  Diagnosis Date   Allergic rhinitis    Frequent headaches    Gout    Hypertriglyceridemia    Sleep apnea    Type 2 diabetes mellitus ([place])     Social History   Socioeconomic History   Marital status: Married    Spouse name: Not on file   Number of children: Not on file   Years of education: Not on  file   Highest education level: Not on file  Occupational History   Not on file  Tobacco Use   Smoking status: Never   Smokeless tobacco: Never  Vaping Use   Vaping Use: Never used  Substance and Sexual Activity   Alcohol use: Yes    Alcohol/week: 0.0 standard drinks of alcohol    Comment: rarely   Drug use: No   Sexual activity: Yes  Other Topics Concern   Not on file  Social History Narrative   Married.   3 children.   Works as a Therapist, nutritional.   Enjoys relaxing, spending time outdoors.   Social Determinants of Health   Financial Resource Strain: Not on file  Food Insecurity: Not on file  Transportation Needs: Not on file  Physical Activity: Not on file  Stress: Not on file  Social Connections: Not on file  Intimate Partner Violence: Not on file    Past Surgical History:  Procedure Laterality Date   APPENDECTOMY     EXTRACORPOREAL SHOCK WAVE LITHOTRIPSY Left 09/02/2021   Procedure: LEFT EXTRACORPOREAL SHOCK WAVE LITHOTRIPSY (ESWL);  Surgeon: Bjorn Loser, MD;  Location: [facility];  Service: Urology;  Laterality: Left;   LITHOTRIPSY  08/2021   POLYPECTOMY     ROTATOR CUFF REPAIR  07/26/2007   SEPTOPLASTY      Family History  Problem Relation Age of Onset   Alcohol abuse Mother    Diabetes Father  Breast cancer Paternal Grandmother    Lung cancer Paternal Grandfather     Allergies  Allergen Reactions   Bee Pollen Anaphylaxis   Bee Venom Anaphylaxis   Statins Other (See Comments)    Memory issues    Current Outpatient Medications on File Prior to Visit  Medication Sig Dispense Refill   albuterol (VENTOLIN HFA) 108 (90 Base) MCG/ACT inhaler USE 2 INHALATIONS EVERY 6 HOURS AS NEEDED 8 g 0   BD PEN NEEDLE NANO U/F 32G X 4 MM MISC USE TO INJECT INSULIN 100 each 3   blood glucose meter kit and supplies KIT Dispense based on patient and insurance preference. Use up to 3 times daily as directed. (Dx is E11.9). 1 each 0   buPROPion  (WELLBUTRIN XL) 150 MG 24 hr tablet TAKE 1 TABLET DAILY FOR DEPRESSION 90 tablet 0   clonazePAM (KLONOPIN) 0.5 MG tablet Take 1 tablet (0.5 mg total) by mouth as needed for anxiety. 15 tablet 0   clotrimazole-betamethasone (LOTRISONE) cream Apply topically.     cyclobenzaprine (FLEXERIL) 10 MG tablet Take 1 tablet by mouth once to twice daily as needed for severe headaches. 30 tablet 0   DEXILANT 60 MG capsule Take 1 capsule (60 mg total) by mouth daily. For heartburn. 90 capsule 0   Dulaglutide (TRULICITY) 1.5 NM/0.7WK SOPN INJECT 1.5 MG INTO THE SKIN ONCE A WEEK FOR DIABETES 6 mL 0   EPINEPHrine 0.3 mg/0.3 mL IJ SOAJ injection INJECT 0.3 ML (0.3 MG TOTAL) INTO THE MUSCLE AS NEEDED FOR ANAPHYLAXIS 2 each 0   fenofibrate (TRICOR) 145 MG tablet TAKE 1 TABLET DAILY FOR CHOLESTEROL 90 tablet 0   glucose blood (FREESTYLE LITE) test strip USE UP TO THREE TIMES A DAY AS DIRECTED 300 each 2   insulin detemir (LEVEMIR FLEXTOUCH) 100 UNIT/ML FlexPen INJECT 60 UNITS UNDER THE SKIN AT BEDTIME 60 mL 1   ketorolac (TORADOL) 10 MG tablet Take 1 tablet (10 mg total) by mouth every 6 (six) hours as needed. 20 tablet 0   Lancets (FREESTYLE) lancets USE TO TEST BLOOD SUGAR 2 TO 3 TIMES A DAY AND AS DIRECTED 300 each 1   metFORMIN (GLUCOPHAGE) 1000 MG tablet TAKE 1 TABLET TWICE A DAY WITH MEALS FOR DIABETES 180 tablet 0   montelukast (SINGULAIR) 10 MG tablet TAKE 1 TABLET AT BEDTIME FOR ALLERGIES 90 tablet 2   sildenafil (VIAGRA) 100 MG tablet      tadalafil (CIALIS) 20 MG tablet TAKE 1 TABLET 30 MINUTES PRIOR TO INTERCOURSE AS NEEDED 30 tablet 0   traZODone (DESYREL) 50 MG tablet TAKE 1 TABLET AT BEDTIME AS NEEDED FOR SLEEP 90 tablet 0   No current facility-administered medications on file prior to visit.    BP 130/80   Pulse 91   Temp (!) 97.3 F (36.3 C) (Temporal)   Ht _0  (1.727 m)   Wt 229 lb (103.9 kg)   SpO2 99%   BMI 34.82 kg/m  Objective:   Physical Exam HENT:     Right Ear: Tympanic  membrane and ear canal normal.     Left Ear: Tympanic membrane and ear canal normal.     Nose: Nose normal.     Right Sinus: No maxillary sinus tenderness or frontal sinus tenderness.     Left Sinus: No maxillary sinus tenderness or frontal sinus tenderness.  Eyes:     Conjunctiva/sclera: Conjunctivae normal.  Neck:     Thyroid: No thyromegaly.     Vascular: No carotid bruit.  Cardiovascular:     Rate and Rhythm: Normal rate and regular rhythm.     Heart sounds: Normal heart sounds.  Pulmonary:     Effort: Pulmonary effort is normal.     Breath sounds: Normal breath sounds. No wheezing or rales.  Abdominal:     General: Bowel sounds are normal.     Palpations: Abdomen is soft.     Tenderness: There is no abdominal tenderness.  Musculoskeletal:        General: Normal range of motion.     Cervical back: Neck supple.  Skin:    General: Skin is warm and dry.  Neurological:     Mental Status: He is alert and oriented to person, place, and time.     Cranial Nerves: No cranial nerve deficit.     Deep Tendon Reflexes: Reflexes are normal and symmetric.  Psychiatric:        Mood and Affect: Mood normal.           Assessment & Plan:   Problem List Items Addressed This Visit       Cardiovascular and Mediastinum   Erectile dysfunction due to arterial insufficiency    Controlled. Following with Urology.  Continue Cialis 20 mg PRN and sildenafil 100 mg PRN.         Respiratory   Allergic rhinitis    Stable.  Continue Singulair 10 mg PRN.        OSA (obstructive sleep apnea)    Compliant to CPAP nightly, continue same.         Digestive   GERD (gastroesophageal reflux disease)    Stable.  Continue Dexilant 60 mg PRN.   Continue to monitor.         Endocrine   Type 2 diabetes mellitus ([facility])    Repeat A1C pending.  Continue Levemir 25 units daily, Trulicity 1.5 mg weekly, metformin 1000 mg BID.   Intolerant to statin therapy. Urine microalbumin  pending.  Follow up in 3-6 months.      Relevant Medications   Continuous Blood Gluc Sensor (DEXCOM G7 SENSOR) MISC   Other Relevant Orders   Microalbumin/Creatinine Ratio, Urine   Hemoglobin A1c     Other   Frequent headaches    Controlled.  Continue cyclobenzaprine 10 mg PRN, clonazepam 0.5 mg PRN. Continue to monitor.       Hyperlipidemia    Repeat lipid panel pending.  Continue fenofibrate 145 mg daily. Intolerant to statins.      Relevant Orders   Lipid panel   Comprehensive metabolic panel   Preventative health care - Primary    Immunizations UTD. Influenza vaccine provided today. Colonoscopy UTD, due 2025. PSA due and pending.  Discussed the importance of a healthy diet and regular exercise in order for weight loss, and to reduce the risk of further co-morbidity.  Exam stable. Labs pending.  Follow up in 1 year for repeat physical.       Bee sting allergy    No recent episodes. Continue Epi Pen PRN.      MDD (major depressive disorder)    Controlled.  Continue bupropion XL 150 mg daily.       Insomnia    Controlled.  Continue Trazodone 50 mg HS.       Other Visit Diagnoses     Need for immunization against influenza       Relevant Orders   Flu Vaccine QUAD 58moIM (Fluarix, Fluzone & Alfiuria Quad PF) (Completed)   Screening for  prostate cancer       Relevant Orders   PSA          Pleas Koch, NP

## 2022-05-24 NOTE — Assessment & Plan Note (Signed)
Repeat lipid panel pending.  Continue fenofibrate 145 mg daily. Intolerant to statins.

## 2022-05-24 NOTE — Telephone Encounter (Signed)
How often do this patient have to come and get a1 c labs completed? Please advise,so that I can schedule. He wasn't sure if it was every 3 or 6 months. Please advise.

## 2022-05-24 NOTE — Assessment & Plan Note (Addendum)
Repeat A1C pending.  Continue Levemir 25 units daily, Trulicity 1.5 mg weekly, metformin 1000 mg BID.   Intolerant to statin therapy. Urine microalbumin pending.  Follow up in 3-6 months.

## 2022-05-24 NOTE — Assessment & Plan Note (Signed)
Compliant to CPAP nightly, continue same.

## 2022-05-24 NOTE — Telephone Encounter (Signed)
Per review of office notes from today's visit. Patient needs to follow up in 6 months for diabetes check.

## 2022-05-24 NOTE — Assessment & Plan Note (Signed)
Stable.  Continue Singulair 10 mg PRN.

## 2022-05-24 NOTE — Assessment & Plan Note (Signed)
Controlled.  Continue Trazodone 50 mg HS.

## 2022-05-24 NOTE — Assessment & Plan Note (Signed)
Controlled.  Continue cyclobenzaprine 10 mg PRN, clonazepam 0.5 mg PRN. Continue to monitor.

## 2022-05-24 NOTE — Assessment & Plan Note (Signed)
Stable.  Continue Dexilant 60 mg PRN.   Continue to monitor.

## 2022-05-24 NOTE — Assessment & Plan Note (Signed)
No recent episodes. Continue Epi Pen PRN.

## 2022-05-24 NOTE — Assessment & Plan Note (Signed)
Controlled. Following with Urology.  Continue Cialis 20 mg PRN and sildenafil 100 mg PRN.

## 2022-06-20 ENCOUNTER — Other Ambulatory Visit: Payer: Self-pay | Admitting: Primary Care

## 2022-06-20 DIAGNOSIS — E119 Type 2 diabetes mellitus without complications: Secondary | ICD-10-CM

## 2022-06-20 NOTE — Telephone Encounter (Signed)
Received refill request for Levemir insulin. It looks like Levemir may no longer be on his insurance pharmacy formulary, rather, they may prefer Lantus. Does he know anything about this?  Is it okay to try Lantus? It would be the same directions.

## 2022-06-21 NOTE — Telephone Encounter (Signed)
Noted. Will send in [place] for now. We will let him know if the insurance rejects and requests something like Lantus.

## 2022-06-21 NOTE — Telephone Encounter (Signed)
Spoke with patient, he had not heard anything about Levemir not being covered, but he is okay to switch to Lantus if we know it will be covered.

## 2022-06-21 NOTE — Telephone Encounter (Signed)
Unable to reach patient. Left voicemail to return call to our office.   

## 2022-06-23 ENCOUNTER — Other Ambulatory Visit: Payer: Self-pay | Admitting: Primary Care

## 2022-06-23 DIAGNOSIS — G4700 Insomnia, unspecified: Secondary | ICD-10-CM

## 2022-06-30 ENCOUNTER — Telehealth: Payer: Self-pay | Admitting: Primary Care

## 2022-06-30 NOTE — Telephone Encounter (Signed)
Patient called in and had questions regarding his Dexcom G7.

## 2022-06-30 NOTE — Telephone Encounter (Signed)
Patient sent a mychart message regarding this. I have responded through [employer] to him.

## 2022-07-01 ENCOUNTER — Other Ambulatory Visit: Payer: Self-pay | Admitting: Primary Care

## 2022-07-11 ENCOUNTER — Other Ambulatory Visit: Payer: Self-pay | Admitting: Primary Care

## 2022-07-11 ENCOUNTER — Telehealth: Payer: Self-pay | Admitting: Primary Care

## 2022-07-11 DIAGNOSIS — E782 Mixed hyperlipidemia: Secondary | ICD-10-CM

## 2022-07-11 NOTE — Telephone Encounter (Signed)
[employer] is sending over request for patient last office visit.

## 2022-07-12 NOTE — Telephone Encounter (Signed)
Form placed in [place] box for review and completion.

## 2022-07-13 NOTE — Telephone Encounter (Signed)
The only form we received was completed and faxed back to [employer]. Received fax confirmation.

## 2022-07-13 NOTE — Telephone Encounter (Signed)
I believe I completed this yesterday, let me know if there is another form.

## 2022-07-21 ENCOUNTER — Other Ambulatory Visit: Payer: Self-pay | Admitting: Primary Care

## 2022-07-21 DIAGNOSIS — F320 Major depressive disorder, single episode, mild: Secondary | ICD-10-CM

## 2022-09-28 ENCOUNTER — Telehealth: Payer: Self-pay | Admitting: Primary Care

## 2022-09-28 NOTE — Telephone Encounter (Signed)
He will need an appointment with me in order for me to fill the form out.

## 2022-09-28 NOTE — Telephone Encounter (Signed)
Patient dropped off document  assessment form , to be filled out by provider. Patient requested to send it via Fax within 5-days. Document is located in providers tray at front office.Please advise at Mobile [phone] (mobile)

## 2022-09-28 NOTE — Telephone Encounter (Signed)
Patient has been scheduled an appointment with Romilda Garret, NP March 11th to review the form that needs to be completed.

## 2022-09-28 NOTE — Telephone Encounter (Signed)
Looks like they will only accept appeal from a legal representative as appointed by the form that the patient shared or the patient himself.   Please be advised we currently do not have a Pharmacist to review denials, therefore you will need to process appeals accordingly as needed. Thanks for your support at this time.

## 2022-09-28 NOTE — Telephone Encounter (Signed)
Patient states he needs this form completed by 10/06/22. He was advised Anda Kraft is out of the office until the week of 10/11/22 and insisted we check with another provider if they would be willing to fill out. The form requires him to have an A1C within the last 3 months. Patient never scheduled lab appointment per [facility] request for an A1C check in January. Orders would need to be placed. I have placed form in your box for review, please let me know if you're willing to fill out.

## 2022-09-28 NOTE — Telephone Encounter (Signed)
Patient was contacted to set up appointment with Romilda Garret to complete form in [place] absence. Scheduled appt March 11th

## 2022-09-29 ENCOUNTER — Other Ambulatory Visit: Payer: Self-pay | Admitting: Primary Care

## 2022-09-29 DIAGNOSIS — E119 Type 2 diabetes mellitus without complications: Secondary | ICD-10-CM

## 2022-09-30 ENCOUNTER — Other Ambulatory Visit

## 2022-10-03 ENCOUNTER — Ambulatory Visit: Admitting: Nurse Practitioner

## 2022-10-03 ENCOUNTER — Encounter: Payer: Self-pay | Admitting: Nurse Practitioner

## 2022-10-03 VITALS — BP 128/78 | HR 107 | Temp 98.2°F | Resp 16 | Ht 68.0 in | Wt 225.0 lb

## 2022-10-03 DIAGNOSIS — Z794 Long term (current) use of insulin: Secondary | ICD-10-CM

## 2022-10-03 DIAGNOSIS — E1165 Type 2 diabetes mellitus with hyperglycemia: Secondary | ICD-10-CM

## 2022-10-03 LAB — POCT GLYCOSYLATED HEMOGLOBIN (HGB A1C): Hemoglobin A1C: 6.1 % — AB (ref 4.0–5.6)

## 2022-10-03 NOTE — Assessment & Plan Note (Addendum)
Patient currently maintained on Trulicity 3 mg weekly, Levemir 25 units daily, metformin 1000 mg twice daily.  Patient uses CGM.  A1c 6.1% today in office.  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  Patient's primary care provider not in office so I asked patient to be seen in office 3 to fill out form.  Form was filled out copy obtained at original given to patient and faxed to DOT certified.

## 2022-10-03 NOTE — Patient Instructions (Addendum)
Nice to see you today Looks like the April appt is for your diabetes and then later in the year your have a physical. I will message kate and let her know and she will reach out and either tell you to keep the appointment or she can change the date

## 2022-10-03 NOTE — Progress Notes (Signed)
Acute Office Visit  Subjective:     Patient ID: Devon Jackson, male    DOB: 09-08-1965, 57 y.o.   MRN: [id number]  Chief Complaint  Patient presents with   Diabetes     Patient is in today for DM2 Dot supplemental form  DM2:Patient is currently maintained on Trulicity 3 mg, Levemir 25 units daily, metformin 1000 mg twice a day.  Patient does have a continuous glucose monitor.  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   Patient's last A1c was May 24, 2022 and a value of 7.0% States that it was done last week   States that 5-6 times a day to check his sugars  He has lost some weight Diet: states that he is doing 3 meals a day. Sometimes breakfast is hit or miss. Does both out and at home meals No exercise out side employment   Review of Systems  Constitutional:  Negative for chills and fever.  Respiratory:  Negative for shortness of breath.   Cardiovascular:  Negative for chest pain.  Gastrointestinal:  Negative for abdominal pain, constipation, diarrhea, nausea and vomiting.       Bm daily   Genitourinary:  Negative for dysuria and hematuria.  Neurological:  Negative for tingling and headaches.  Psychiatric/Behavioral:  Negative for hallucinations and suicidal ideas.         Objective:    BP 128/78   Pulse (!) 107   Temp 98.2 F (36.8 C)   Resp 16   Ht '5\' 8"'$  (1.727 m)   Wt 225 lb (102.1 kg)   SpO2 96%   BMI 34.21 kg/m  BP Readings from Last 3 Encounters:  10/03/22 128/78  05/24/22 130/80  11/12/21 138/80   Wt Readings from Last 3 Encounters:  10/03/22 225 lb (102.1 kg)  05/24/22 229 lb (103.9 kg)  11/12/21 243 lb (110.2 kg)      Physical Exam Vitals and nursing note reviewed.  Constitutional:      Appearance: Normal appearance.  Cardiovascular:     Rate and Rhythm: Regular rhythm.  Tachycardia present.     Heart sounds: Normal heart sounds.  Pulmonary:     Effort: Pulmonary effort is normal.     Breath sounds: Normal breath sounds.  Abdominal:     General: Bowel sounds are normal. There is no distension.     Palpations: There is no mass.     Tenderness: There is no abdominal tenderness.     Hernia: No hernia is present.  Musculoskeletal:     Right lower leg: No edema.     Left lower leg: No edema.  Skin:    General: Skin is warm.  Neurological:     Mental Status: He is alert.     Diabetic Foot Form - Detailed   Diabetic Foot Exam - detailed Diabetic Foot exam was performed with the following findings: Yes 10/03/2022  2:08 PM  Is there swelling or and abnormal foot shape?: No Is there a claw toe deformity?: No Is there elevated skin temparature?: No Pulse Foot Exam completed.: Yes   Right posterior Tibialias: Present Left posterior Tibialias: Present   Right Dorsalis Pedis: Present Left Dorsalis Pedis: Present  Sensory Foot Exam Completed.: Yes Semmes-Weinstein Monofilament Test   Comments: All 10 sites tested bilaterally sensation intact Callus to right great toe on medial  surface      Results for orders placed or performed in visit on 10/03/22  POCT glycosylated hemoglobin (Hb A1C)  Result Value Ref Range   Hemoglobin A1C 6.1 (A) 4.0 - 5.6 %   HbA1c POC (<> result, manual entry)     HbA1c, POC (prediabetic range)     HbA1c, POC (controlled diabetic range)          Assessment & Plan:   Problem List Items Addressed This Visit       Endocrine   Type 2 diabetes mellitus with hyperglycemia ([place]) - Primary    Patient currently maintained on Trulicity 3 mg weekly, Levemir 25 units daily, metformin 1000 mg twice daily.  Patient uses CGM.  A1c 6.1% today in office.  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  Patient's primary care provider not in office so I asked  patient to be seen in office 3 to fill out form.  Form was filled out copy obtained at original given to patient and faxed to DOT certified.      Relevant Orders   POCT glycosylated hemoglobin (Hb A1C) (Completed)    No orders of the defined types were placed in this encounter.   Return if symptoms worsen or fail to improve, for As scheduled with kate.  Romilda Garret, NP

## 2022-11-03 ENCOUNTER — Encounter (INDEPENDENT_AMBULATORY_CARE_PROVIDER_SITE_OTHER)

## 2022-11-03 DIAGNOSIS — Z794 Long term (current) use of insulin: Secondary | ICD-10-CM

## 2022-11-04 DIAGNOSIS — Z794 Long term (current) use of insulin: Secondary | ICD-10-CM

## 2022-11-04 DIAGNOSIS — E1165 Type 2 diabetes mellitus with hyperglycemia: Secondary | ICD-10-CM | POA: Diagnosis not present

## 2022-11-04 MED ORDER — METFORMIN HCL 1000 MG PO TABS: ORAL_TABLET | ORAL | 0 refills | Status: DC

## 2022-11-04 MED ORDER — TRULICITY 4.5 MG/0.5ML ~~LOC~~ SOAJ: 4.5000 mg | SUBCUTANEOUS | 0 refills | Status: DC

## 2022-11-04 NOTE — Telephone Encounter (Signed)
Please see the MyChart message reply(ies) for my assessment and plan.  The patient gave consent for this Medical Advice Message and is aware that it may result in a bill to their insurance company as well as the possibility that this may result in a co-payment or deductible. They are an established patient, but are not seeking medical advice exclusively about a problem treated during an in person or video visit in the last 7 days. I did not recommend an in person or video visit within 7 days of my reply.  I spent a total of 15 minutes cumulative time within 7 days through MyChart messaging Aubrei Bouchie K Katoya Amato, NP  

## 2022-11-22 ENCOUNTER — Ambulatory Visit: Admitting: Primary Care

## 2023-01-10 ENCOUNTER — Ambulatory Visit: Admitting: Primary Care

## 2023-01-10 ENCOUNTER — Encounter: Payer: Self-pay | Admitting: Primary Care

## 2023-01-10 ENCOUNTER — Other Ambulatory Visit: Payer: Self-pay | Admitting: Primary Care

## 2023-01-10 VITALS — BP 132/86 | HR 53 | Temp 98.1°F | Ht 68.0 in | Wt 225.0 lb

## 2023-01-10 DIAGNOSIS — F3342 Major depressive disorder, recurrent, in full remission: Secondary | ICD-10-CM

## 2023-01-10 DIAGNOSIS — F320 Major depressive disorder, single episode, mild: Secondary | ICD-10-CM

## 2023-01-10 DIAGNOSIS — E119 Type 2 diabetes mellitus without complications: Secondary | ICD-10-CM

## 2023-01-10 DIAGNOSIS — E1165 Type 2 diabetes mellitus with hyperglycemia: Secondary | ICD-10-CM

## 2023-01-10 DIAGNOSIS — Z794 Long term (current) use of insulin: Secondary | ICD-10-CM | POA: Diagnosis not present

## 2023-01-10 DIAGNOSIS — R454 Irritability and anger: Secondary | ICD-10-CM

## 2023-01-10 LAB — POCT GLYCOSYLATED HEMOGLOBIN (HGB A1C): Hemoglobin A1C: 6.2 % — AB (ref 4.0–5.6)

## 2023-01-10 MED ORDER — LEVEMIR FLEXPEN 100 UNIT/ML ~~LOC~~ SOPN: 30.0000 [IU] | PEN_INJECTOR | Freq: Every day | SUBCUTANEOUS | 0 refills | Status: DC

## 2023-01-10 MED ORDER — BUPROPION HCL ER (XL) 150 MG PO TB24: 300.0000 mg | ORAL_TABLET | Freq: Every day | ORAL | 0 refills | Status: DC

## 2023-01-10 NOTE — Patient Instructions (Signed)
We can increase your Wellbutrin XL to 300 mg daily.  You can take 2 of the 150 mg dose to make 300 mg until your current bottle is empty.  Please update me in a few weeks.  Reduce your Levemir to 30 units daily.  Continue all other diabetes medications as prescribed.  We will see you in November as scheduled!

## 2023-01-10 NOTE — Assessment & Plan Note (Signed)
Overall improved with Wellbutrin, not yet at goal.  Increase Wellbutrin XL to 300 mg daily.  He has plenty of the 150 mg dose in his bottle and will start doubling up.  He will update in a few weeks.

## 2023-01-10 NOTE — Progress Notes (Signed)
Subjective:    Patient ID: Devon Jackson, male    DOB: 04-02-1966, 57 y.o.   MRN: [id number]  HPI  Devon Jackson is a very pleasant 57 y.o. male with a history of type 2 diabetes, OSA, hyperlipidemia, who presents today for follow-up of diabetes and depression/anxiety.  1) Type 2 Diabetes: Current medications include: Metformin 1000 mg twice daily, Levemir 35 units daily, Trulicity 4.5 mg weekly.   He began Trulicity 4.5 mg about one month ago.   He is checking his blood glucose 2-4 times daily and is getting readings of:  AM fasting: 120's-130's 6 pm before dinner: low 100's   Last A1C: 6.1 in March 2024, Last Eye Exam: Up-to-date Last Foot Exam: Up-to-date Pneumonia Vaccination: 2017 Urine Microalbumin: Up-to-date Statin: Intolerant  Dietary changes since last visit: Limiting intake of starchy foods.    Exercise: No regular exercise  BP Readings from Last 3 Encounters:  01/10/23 132/86  10/03/22 128/78  05/24/22 130/80   2) Depression/Irritability: Currently managed on Wellbutrin XL 150 mg daily. Overall, he's noticed improvement in his mood but he continues to experience irritability several times weekly. He questions if he can increase his dose of Wellbutrin.   He has not experienced side effects with Wellbutrin.    Review of Systems  Respiratory:  Negative for shortness of breath.   Cardiovascular:  Negative for chest pain.  Neurological:  Negative for dizziness and numbness.  Psychiatric/Behavioral:  Positive for agitation.          Past Medical History:  Diagnosis Date   Allergic rhinitis    Frequent headaches    Gout    Hypertriglyceridemia    Sleep apnea    Type 2 diabetes mellitus (HCC)     Social History   Socioeconomic History   Marital status: Married    Spouse name: Not on file   Number of children: Not on file   Years of education: Not on file   Highest education level: Not on file  Occupational History   Not on file  Tobacco  Use   Smoking status: Never   Smokeless tobacco: Never  Vaping Use   Vaping Use: Never used  Substance and Sexual Activity   Alcohol use: Yes    Alcohol/week: 0.0 standard drinks of alcohol    Comment: rarely   Drug use: No   Sexual activity: Yes  Other Topics Concern   Not on file  Social History Narrative   Married.   3 children.   Works as a Financial planner.   Enjoys relaxing, spending time outdoors.   Social Determinants of Health   Financial Resource Strain: Not on file  Food Insecurity: Not on file  Transportation Needs: Not on file  Physical Activity: Not on file  Stress: Not on file  Social Connections: Not on file  Intimate Partner Violence: Not on file    Past Surgical History:  Procedure Laterality Date   APPENDECTOMY     EXTRACORPOREAL SHOCK WAVE LITHOTRIPSY Left 09/02/2021   Procedure: LEFT EXTRACORPOREAL SHOCK WAVE LITHOTRIPSY (ESWL);  Surgeon: Alfredo Martinez, MD;  Location: [facility];  Service: Urology;  Laterality: Left;   LITHOTRIPSY  08/2021   POLYPECTOMY     ROTATOR CUFF REPAIR  07/26/2007   SEPTOPLASTY      Family History  Problem Relation Age of Onset   Alcohol abuse Mother    Diabetes Father    Breast cancer Paternal Grandmother    Lung cancer Paternal Grandfather  Allergies  Allergen Reactions   Bee Pollen Anaphylaxis   Bee Venom Anaphylaxis   Statins Other (See Comments)    Memory issues    Current Outpatient Medications on File Prior to Visit  Medication Sig Dispense Refill   albuterol (VENTOLIN HFA) 108 (90 Base) MCG/ACT inhaler USE 2 INHALATIONS EVERY 6 HOURS AS NEEDED 8 g 0   BD PEN NEEDLE NANO U/F 32G X 4 MM MISC USE TO INJECT INSULIN 100 each 3   blood glucose meter kit and supplies KIT Dispense based on patient and insurance preference. Use up to 3 times daily as directed. (Dx is E11.9). 1 each 0   clonazePAM (KLONOPIN) 0.5 MG tablet Take 1 tablet (0.5 mg total) by mouth as needed for anxiety. 15  tablet 0   clotrimazole-betamethasone (LOTRISONE) cream Apply topically.     cyclobenzaprine (FLEXERIL) 10 MG tablet Take 1 tablet by mouth once to twice daily as needed for severe headaches. 30 tablet 0   DEXILANT 60 MG capsule Take 1 capsule (60 mg total) by mouth daily. For heartburn. 90 capsule 0   Dulaglutide (TRULICITY) 4.5 MG/0.5ML SOPN Inject 4.5 mg as directed once a week. for diabetes. 6 mL 0   EPINEPHrine 0.3 mg/0.3 mL IJ SOAJ injection INJECT 0.3 ML (0.3 MG TOTAL) INTO THE MUSCLE AS NEEDED FOR ANAPHYLAXIS 2 each 0   fenofibrate (TRICOR) 145 MG tablet TAKE 1 TABLET DAILY FOR CHOLESTEROL 90 tablet 2   glucose blood (FREESTYLE LITE) test strip USE UP TO THREE TIMES A DAY AS DIRECTED 300 each 2   Lancets (FREESTYLE) lancets USE TO TEST BLOOD SUGAR 2 TO 3 TIMES A DAY AND AS DIRECTED 300 each 1   metFORMIN (GLUCOPHAGE) 1000 MG tablet TAKE 1 TABLET TWICE A DAY WITH MEALS FOR DIABETES 180 tablet 0   montelukast (SINGULAIR) 10 MG tablet TAKE 1 TABLET AT BEDTIME FOR ALLERGIES 90 tablet 2   sildenafil (VIAGRA) 100 MG tablet      tadalafil (CIALIS) 20 MG tablet TAKE 1 TABLET 30 MINUTES PRIOR TO INTERCOURSE AS NEEDED 30 tablet 0   traZODone (DESYREL) 50 MG tablet TAKE 1 TABLET AT BEDTIME AS NEEDED FOR SLEEP 90 tablet 2   ketorolac (TORADOL) 10 MG tablet Take 1 tablet (10 mg total) by mouth every 6 (six) hours as needed. (Patient not taking: Reported on 01/10/2023) 20 tablet 0   No current facility-administered medications on file prior to visit.    BP 132/86   Pulse (!) 53   Temp 98.1 F (36.7 C) (Temporal)   Ht 5\' 8"  (1.727 m)   Wt 225 lb (102.1 kg)   SpO2 99%   BMI 34.21 kg/m  Objective:   Physical Exam Cardiovascular:     Rate and Rhythm: Normal rate and regular rhythm.  Pulmonary:     Effort: Pulmonary effort is normal.     Breath sounds: Normal breath sounds. No wheezing or rales.  Musculoskeletal:     Cervical back: Neck supple.  Skin:    General: Skin is warm and dry.   Neurological:     Mental Status: He is alert and oriented to person, place, and time.           Assessment & Plan:  Type 2 diabetes mellitus with hyperglycemia, with long-term current use of insulin (HCC) Assessment & Plan: Controlled with A1C of 6.2 today.  Reduce Levemir to 30 units daily. Continue Trulicity 4.5 mg weekly, metformin 1000 mg twice daily.  Commended him on dietary changes,  encouraged to continue.  Follow-up in November as scheduled.  Orders: -     POCT glycosylated hemoglobin (Hb A1C)  Recurrent major depressive disorder, in full remission [facility]) Assessment & Plan: Improved over the years, room for improvement in regards to irritability.  Increase Wellbutrin XL to 300 mg daily. He will update in a few weeks.   Irritability and anger Assessment & Plan: Overall improved with Wellbutrin, not yet at goal.  Increase Wellbutrin XL to 300 mg daily.  He has plenty of the 150 mg dose in his bottle and will start doubling up.  He will update in a few weeks.   Current mild episode of major depressive disorder without prior episode [facility]) Assessment & Plan: Improved over the years, room for improvement in regards to irritability.  Increase Wellbutrin XL to 300 mg daily. He will update in a few weeks.  Orders: -     buPROPion HCl ER (XL); Take 2 tablets (300 mg total) by mouth daily.  Dispense: 180 tablet; Refill: 0  Type 2 diabetes mellitus without complication, with long-term current use of insulin (HCC) -     Levemir FlexPen; Inject 30 Units into the skin daily.  Dispense: 30 mL; Refill: 0        Doreene Nest, NP

## 2023-01-10 NOTE — Assessment & Plan Note (Signed)
Controlled with A1C of 6.2 today.  Reduce Levemir to 30 units daily. Continue Trulicity 4.5 mg weekly, metformin 1000 mg twice daily.  Commended him on dietary changes, encouraged to continue.  Follow-up in November as scheduled.

## 2023-01-10 NOTE — Assessment & Plan Note (Signed)
Improved over the years, room for improvement in regards to irritability.  Increase Wellbutrin XL to 300 mg daily. He will update in a few weeks.

## 2023-01-26 ENCOUNTER — Other Ambulatory Visit: Payer: Self-pay | Admitting: Primary Care

## 2023-01-26 DIAGNOSIS — Z794 Long term (current) use of insulin: Secondary | ICD-10-CM

## 2023-02-01 ENCOUNTER — Telehealth: Payer: Self-pay

## 2023-02-01 DIAGNOSIS — E1165 Type 2 diabetes mellitus with hyperglycemia: Secondary | ICD-10-CM

## 2023-02-01 MED ORDER — FREESTYLE LITE TEST VI STRP: ORAL_STRIP | 2 refills | Status: DC

## 2023-02-01 NOTE — Addendum Note (Signed)
Addended by: Eion Timbrook K on: 02/01/2023 10:09 AM   Modules accepted: Orders  

## 2023-02-01 NOTE — Telephone Encounter (Signed)
Refills sent to pharmacy. 

## 2023-02-02 ENCOUNTER — Other Ambulatory Visit: Payer: Self-pay | Admitting: Primary Care

## 2023-02-02 DIAGNOSIS — Z794 Long term (current) use of insulin: Secondary | ICD-10-CM

## 2023-03-07 ENCOUNTER — Telehealth: Payer: Self-pay

## 2023-03-07 NOTE — Telephone Encounter (Signed)
Faxed signed order/certification to [facility]. Successful confirmation page printed.

## 2023-03-20 ENCOUNTER — Other Ambulatory Visit: Payer: Self-pay | Admitting: Primary Care

## 2023-03-20 DIAGNOSIS — G4700 Insomnia, unspecified: Secondary | ICD-10-CM

## 2023-03-23 DIAGNOSIS — F3342 Major depressive disorder, recurrent, in full remission: Secondary | ICD-10-CM

## 2023-03-23 MED ORDER — BUPROPION HCL ER (XL) 300 MG PO TB24
300.0000 mg | ORAL_TABLET | Freq: Every day | ORAL | 0 refills | Status: DC
Start: 2023-03-23 — End: 2023-06-05

## 2023-04-06 ENCOUNTER — Other Ambulatory Visit: Payer: Self-pay | Admitting: Primary Care

## 2023-04-06 DIAGNOSIS — E782 Mixed hyperlipidemia: Secondary | ICD-10-CM

## 2023-04-13 LAB — HM DIABETES EYE EXAM

## 2023-04-19 ENCOUNTER — Other Ambulatory Visit: Payer: Self-pay | Admitting: Primary Care

## 2023-04-19 DIAGNOSIS — E119 Type 2 diabetes mellitus without complications: Secondary | ICD-10-CM

## 2023-04-21 ENCOUNTER — Other Ambulatory Visit: Payer: Self-pay

## 2023-04-21 DIAGNOSIS — Z794 Long term (current) use of insulin: Secondary | ICD-10-CM

## 2023-04-21 MED ORDER — TRULICITY 4.5 MG/0.5ML ~~LOC~~ SOAJ
4.5000 mg | SUBCUTANEOUS | 0 refills | Status: DC
Start: 2023-04-21 — End: 2023-04-26

## 2023-04-26 DIAGNOSIS — E1165 Type 2 diabetes mellitus with hyperglycemia: Secondary | ICD-10-CM

## 2023-04-26 MED ORDER — TRULICITY 4.5 MG/0.5ML ~~LOC~~ SOAJ
4.5000 mg | SUBCUTANEOUS | 0 refills | Status: DC
Start: 2023-04-26 — End: 2023-07-21

## 2023-05-03 ENCOUNTER — Other Ambulatory Visit: Payer: Self-pay | Admitting: Primary Care

## 2023-05-03 DIAGNOSIS — Z794 Long term (current) use of insulin: Secondary | ICD-10-CM

## 2023-05-30 ENCOUNTER — Ambulatory Visit
Admission: RE | Admit: 2023-05-30 | Discharge: 2023-05-30 | Disposition: A | Discharge status: Home / Self Care | Source: Ambulatory Visit | Attending: Primary Care

## 2023-05-30 ENCOUNTER — Ambulatory Visit: Admitting: Primary Care

## 2023-05-30 ENCOUNTER — Encounter: Payer: Self-pay | Admitting: Primary Care

## 2023-05-30 ENCOUNTER — Ambulatory Visit: Attending: Primary Care

## 2023-05-30 VITALS — BP 122/60 | Temp 98.6°F | Ht 68.0 in | Wt 226.0 lb

## 2023-05-30 DIAGNOSIS — Z125 Encounter for screening for malignant neoplasm of prostate: Secondary | ICD-10-CM

## 2023-05-30 DIAGNOSIS — E1165 Type 2 diabetes mellitus with hyperglycemia: Secondary | ICD-10-CM

## 2023-05-30 DIAGNOSIS — G4733 Obstructive sleep apnea (adult) (pediatric): Secondary | ICD-10-CM

## 2023-05-30 DIAGNOSIS — G8929 Other chronic pain: Secondary | ICD-10-CM | POA: Diagnosis not present

## 2023-05-30 DIAGNOSIS — I493 Ventricular premature depolarization: Secondary | ICD-10-CM | POA: Insufficient documentation

## 2023-05-30 DIAGNOSIS — Z794 Long term (current) use of insulin: Secondary | ICD-10-CM | POA: Diagnosis not present

## 2023-05-30 DIAGNOSIS — Z0000 Encounter for general adult medical examination without abnormal findings: Secondary | ICD-10-CM | POA: Diagnosis not present

## 2023-05-30 DIAGNOSIS — K219 Gastro-esophageal reflux disease without esophagitis: Secondary | ICD-10-CM

## 2023-05-30 DIAGNOSIS — G4700 Insomnia, unspecified: Secondary | ICD-10-CM

## 2023-05-30 DIAGNOSIS — I499 Cardiac arrhythmia, unspecified: Secondary | ICD-10-CM

## 2023-05-30 DIAGNOSIS — E782 Mixed hyperlipidemia: Secondary | ICD-10-CM

## 2023-05-30 DIAGNOSIS — R519 Headache, unspecified: Secondary | ICD-10-CM | POA: Diagnosis not present

## 2023-05-30 DIAGNOSIS — R454 Irritability and anger: Secondary | ICD-10-CM | POA: Diagnosis not present

## 2023-05-30 DIAGNOSIS — N5201 Erectile dysfunction due to arterial insufficiency: Secondary | ICD-10-CM

## 2023-05-30 DIAGNOSIS — M542 Cervicalgia: Secondary | ICD-10-CM

## 2023-05-30 DIAGNOSIS — F3342 Major depressive disorder, recurrent, in full remission: Secondary | ICD-10-CM

## 2023-05-30 DIAGNOSIS — Z91030 Bee allergy status: Secondary | ICD-10-CM

## 2023-05-30 DIAGNOSIS — Z0001 Encounter for general adult medical examination with abnormal findings: Secondary | ICD-10-CM

## 2023-05-30 LAB — LIPID PANEL
Cholesterol: 161 mg/dL (ref 0–200)
HDL: 35.2 mg/dL — ABNORMAL LOW (ref >39.00)
LDL Cholesterol: 106 mg/dL — ABNORMAL HIGH (ref 0–99)
NonHDL: 126.07
Total CHOL/HDL Ratio: 5
Triglycerides: 99 mg/dL (ref 0.0–149.0)
VLDL: 19.8 mg/dL (ref 0.0–40.0)

## 2023-05-30 LAB — COMPREHENSIVE METABOLIC PANEL
ALT: 16 U/L (ref 0–53)
AST: 13 U/L (ref 0–37)
Albumin: 4.4 g/dL (ref 3.5–5.2)
Alkaline Phosphatase: 37 U/L — ABNORMAL LOW (ref 39–117)
BUN: 12 mg/dL (ref 6–23)
CO2: 30 meq/L (ref 19–32)
Calcium: 9.5 mg/dL (ref 8.4–10.5)
Chloride: 104 meq/L (ref 96–112)
Creatinine, Ser: 1.11 mg/dL (ref 0.40–1.50)
GFR: 73.25 mL/min (ref >60.00)
Glucose, Bld: 113 mg/dL — ABNORMAL HIGH (ref 70–99)
Potassium: 4.3 meq/L (ref 3.5–5.1)
Sodium: 141 meq/L (ref 135–145)
Total Bilirubin: 0.7 mg/dL (ref 0.2–1.2)
Total Protein: 6.5 g/dL (ref 6.0–8.3)

## 2023-05-30 LAB — PSA: PSA: 0.73 ng/mL (ref 0.10–4.00)

## 2023-05-30 LAB — CBC
HCT: 45.9 % (ref 39.0–52.0)
Hemoglobin: 15.2 g/dL (ref 13.0–17.0)
MCHC: 33.1 g/dL (ref 30.0–36.0)
MCV: 87.6 fL (ref 78.0–100.0)
Platelets: 274 10*3/uL (ref 150.0–400.0)
RBC: 5.23 Mil/uL (ref 4.22–5.81)
RDW: 13.8 % (ref 11.5–15.5)
WBC: 4.9 10*3/uL (ref 4.0–10.5)

## 2023-05-30 LAB — HEMOGLOBIN A1C: Hgb A1c MFr Bld: 6.5 % (ref 4.6–6.5)

## 2023-05-30 LAB — TSH: TSH: 1.36 u[IU]/mL (ref 0.35–5.50)

## 2023-05-30 LAB — MICROALBUMIN / CREATININE URINE RATIO
Creatinine,U: 119.7 mg/dL
Microalb Creat Ratio: 1.1 mg/g (ref 0.0–30.0)
Microalb, Ur: 1.3 mg/dL (ref 0.0–1.9)

## 2023-05-30 MED ORDER — AMITRIPTYLINE HCL 10 MG PO TABS: 10.0000 mg | ORAL_TABLET | Freq: Every day | ORAL | 0 refills | Status: DC

## 2023-05-30 NOTE — Assessment & Plan Note (Signed)
Repeat lipid panel pending. Continue fenofibrate 145 mg daily.  Intolerant to multiple statins.

## 2023-05-30 NOTE — Assessment & Plan Note (Signed)
Stable.  Continue Dexilant 60 mg PRN.   Continue to monitor.

## 2023-05-30 NOTE — Assessment & Plan Note (Signed)
Controlled.  Continue sildenafil 100 mg PRN and tadalafil 20 mg daily PRN.

## 2023-05-30 NOTE — Assessment & Plan Note (Signed)
Stable.  Remain off trazodone. Start amitriptyline 10 mg at bedtime for headache prevention.

## 2023-05-30 NOTE — Assessment & Plan Note (Signed)
No use of EpiPen in years.  Continue to monitor.

## 2023-05-30 NOTE — Assessment & Plan Note (Addendum)
Noted during exam.  EKG with trigeminy PVCs, rate of 89, sinus rhythm.  Reviewed EKG from 2020 which notes occasional PVCs.  Given the change a ZIO Holter monitor was ordered and pending. He declines cardiology evaluation at this time.  Await results.

## 2023-05-30 NOTE — Progress Notes (Signed)
Subjective:    Patient ID: Devon Jackson, male    DOB: 11-28-1965, 57 y.o.   MRN: [id number]  HPI  CEFERINO LANG is a very pleasant 57 y.o. male who presents today for complete physical and follow up of chronic conditions.  He would also like to discuss frequent headaches, occurring 3-4 times weekly, lasting hours. Chronic headaches to the left occipital lobes that radiates around to the frontal lobes. His headache initiate to the left lateral neck, occurs with certain movement and touch, then moves to the occipital lobes and sometimes frontal lobes. He is managed on cyclobenzaprine 10 mg PRN clonazepam 0.5 mg PRN which are not effective. He is taking Ibuprofen which helps sometimes. He does sometimes experience nausea and photophobia.   Immunizations: -Tetanus: Completed in 2018 -Influenza: Influenza vaccine provided today.  -Shingles: Completed Shingrix series -Pneumonia: Completed in 2017  Diet: Fair diet.  Exercise: No regular exercise.  Eye exam: Completes annually  Dental exam: Completes semi-annually    Colonoscopy: Completed in 2020, due 2025  PSA: Due   BP Readings from Last 3 Encounters:  05/30/23 122/60  01/10/23 132/86  10/03/22 128/78   Wt Readings from Last 3 Encounters:  05/30/23 226 lb (102.5 kg)  01/10/23 225 lb (102.1 kg)  10/03/22 225 lb (102.1 kg)        Review of Systems  Constitutional:  Negative for unexpected weight change.  HENT:  Negative for rhinorrhea.   Respiratory:  Negative for cough and shortness of breath.   Cardiovascular:  Negative for chest pain.  Gastrointestinal:  Negative for constipation and diarrhea.  Genitourinary:  Negative for difficulty urinating.  Musculoskeletal:  Positive for arthralgias and neck pain.  Skin:  Negative for rash.  Allergic/Immunologic: Negative for environmental allergies.  Neurological:  Positive for headaches. Negative for dizziness and numbness.  Psychiatric/Behavioral:  The patient is not  nervous/anxious.          Past Medical History:  Diagnosis Date   Allergic rhinitis    Frequent headaches    Gout    Hypertriglyceridemia    Sleep apnea    Type 2 diabetes mellitus (HCC)     Social History   Socioeconomic History   Marital status: Married    Spouse name: Not on file   Number of children: Not on file   Years of education: Not on file   Highest education level: Not on file  Occupational History   Not on file  Tobacco Use   Smoking status: Never   Smokeless tobacco: Never  Vaping Use   Vaping status: Never Used  Substance and Sexual Activity   Alcohol use: Yes    Alcohol/week: 0.0 standard drinks of alcohol    Comment: rarely   Drug use: No   Sexual activity: Yes  Other Topics Concern   Not on file  Social History Narrative   Married.   3 children.   Works as a Financial planner.   Enjoys relaxing, spending time outdoors.   Social Determinants of Health   Financial Resource Strain: Not on file  Food Insecurity: Not on file  Transportation Needs: Not on file  Physical Activity: Not on file  Stress: Not on file  Social Connections: Not on file  Intimate Partner Violence: Not on file    Past Surgical History:  Procedure Laterality Date   APPENDECTOMY     EXTRACORPOREAL SHOCK WAVE LITHOTRIPSY Left 09/02/2021   Procedure: LEFT EXTRACORPOREAL SHOCK WAVE LITHOTRIPSY (ESWL);  Surgeon: Alfredo Martinez,  MD;  Location: [facility];  Service: Urology;  Laterality: Left;   LITHOTRIPSY  08/2021   POLYPECTOMY     ROTATOR CUFF REPAIR  07/26/2007   SEPTOPLASTY      Family History  Problem Relation Age of Onset   Alcohol abuse Mother    Diabetes Father    Breast cancer Paternal Grandmother    Lung cancer Paternal Grandfather     Allergies  Allergen Reactions   Bee Pollen Anaphylaxis   Bee Venom Anaphylaxis   Statins Other (See Comments)    Memory issues    Current Outpatient Medications on File Prior to Visit  Medication  Sig Dispense Refill   albuterol (VENTOLIN HFA) 108 (90 Base) MCG/ACT inhaler USE 2 INHALATIONS EVERY 6 HOURS AS NEEDED 8 g 0   BD PEN NEEDLE NANO U/F 32G X 4 MM MISC USE TO INJECT INSULIN 100 each 0   blood glucose meter kit and supplies KIT Dispense based on patient and insurance preference. Use up to 3 times daily as directed. (Dx is E11.9). 1 each 0   buPROPion (WELLBUTRIN XL) 300 MG 24 hr tablet Take 1 tablet (300 mg total) by mouth daily. For depression 90 tablet 0   clonazePAM (KLONOPIN) 0.5 MG tablet Take 1 tablet (0.5 mg total) by mouth as needed for anxiety. 15 tablet 0   clotrimazole-betamethasone (LOTRISONE) cream Apply topically.     cyclobenzaprine (FLEXERIL) 10 MG tablet Take 1 tablet by mouth once to twice daily as needed for severe headaches. 30 tablet 0   DEXILANT 60 MG capsule Take 1 capsule (60 mg total) by mouth daily. For heartburn. 90 capsule 0   Dulaglutide (TRULICITY) 4.5 MG/0.5ML SOPN Inject 4.5 mg as directed once a week. for diabetes. 6 mL 0   EPINEPHrine 0.3 mg/0.3 mL IJ SOAJ injection INJECT 0.3 ML (0.3 MG TOTAL) INTO THE MUSCLE AS NEEDED FOR ANAPHYLAXIS 2 each 0   fenofibrate (TRICOR) 145 MG tablet TAKE 1 TABLET DAILY FOR CHOLESTEROL 90 tablet 0   glucose blood (FREESTYLE LITE) test strip USE UP TO THREE TIMES A DAY AS DIRECTED 300 each 2   insulin detemir (LEVEMIR FLEXPEN) 100 UNIT/ML FlexPen Inject 30 Units into the skin daily. for diabetes. 30 mL 1   Lancets (FREESTYLE) lancets USE TO TEST BLOOD SUGAR 2 TO 3 TIMES A DAY AND AS DIRECTED 300 each 1   metFORMIN (GLUCOPHAGE) 1000 MG tablet TAKE 1 TABLET TWICE A DAY WITH MEALS FOR DIABETES 180 tablet 0   montelukast (SINGULAIR) 10 MG tablet TAKE 1 TABLET AT BEDTIME FOR ALLERGIES 90 tablet 2   sildenafil (VIAGRA) 100 MG tablet      tadalafil (CIALIS) 20 MG tablet TAKE 1 TABLET 30 MINUTES PRIOR TO INTERCOURSE AS NEEDED 30 tablet 0   timolol (TIMOPTIC) 0.5 % ophthalmic solution Place 1 drop into both eyes daily.     No  current facility-administered medications on file prior to visit.    BP 122/60   Temp 98.6 F (37 C) (Oral)   Ht 5\' 8"  (1.727 m)   Wt 226 lb (102.5 kg)   SpO2 96%   BMI 34.36 kg/m  Objective:   Physical Exam HENT:     Right Ear: Tympanic membrane and ear Jackson normal.     Left Ear: Tympanic membrane and ear Jackson normal.  Eyes:     Pupils: Pupils are equal, round, and reactive to light.  Neck:     Comments: Decrease in ROM to left lateral  neck rotation with pain Cardiovascular:     Rate and Rhythm: Normal rate and regular rhythm.     Comments: PVCs noted on exam Pulmonary:     Effort: Pulmonary effort is normal.     Breath sounds: Normal breath sounds.  Abdominal:     General: Bowel sounds are normal.     Palpations: Abdomen is soft.     Tenderness: There is no abdominal tenderness.  Musculoskeletal:     Cervical back: Neck supple. Decreased range of motion.  Skin:    General: Skin is warm and dry.  Neurological:     Mental Status: He is alert and oriented to person, place, and time.     Cranial Nerves: No cranial nerve deficit.     Deep Tendon Reflexes:     Reflex Scores:      Patellar reflexes are 2+ on the right side and 2+ on the left side. Psychiatric:        Mood and Affect: Mood normal.           Assessment & Plan:  Encounter for annual general medical examination with abnormal findings in adult Assessment & Plan: Immunizations UTD. Influenza vaccine provided today.  Colonoscopy UTD, due 2025 PSA due and pending.  Discussed the importance of a healthy diet and regular exercise in order for weight loss, and to reduce the risk of further co-morbidity.  Exam stable. Labs pending.  Follow up in 1 year for repeat physical.    Type 2 diabetes mellitus with hyperglycemia, with long-term current use of insulin (HCC) Assessment & Plan: Repeat A1c pending.  Urine microalbumin due and pending. Continue Levemir 30 units daily, metformin 1000 mg twice  daily, Trulicity 4.5 mg weekly.  Follow-up in 3 to 6 months based on A1c result.  Orders: -     Microalbumin / creatinine urine ratio -     Hemoglobin A1c -     Comprehensive metabolic panel  OSA (obstructive sleep apnea) Assessment & Plan: Continue CPAP nightly.    Erectile dysfunction due to arterial insufficiency Assessment & Plan: Controlled.  Continue sildenafil 100 mg PRN and tadalafil 20 mg daily PRN.    Irritability and anger Assessment & Plan: Controlled.  Continue bupropion XL 300 mg daily. Continue to monitor.    Recurrent major depressive disorder, in full remission [facility]) Assessment & Plan: Controlled.  Continue bupropion XL 300 mg daily. Continue to monitor.    Gastroesophageal reflux disease, unspecified whether esophagitis present Assessment & Plan: Stable.  Continue Dexilant 60 mg PRN. Continue to monitor.    Mixed hyperlipidemia Assessment & Plan: Repeat lipid panel pending. Continue fenofibrate 145 mg daily.  Intolerant to multiple statins.  Orders: -     Lipid panel -     Comprehensive metabolic panel  Chronic neck pain -     DG Cervical Spine 2 or 3 views  Frequent headaches Assessment & Plan: Uncontrolled, seems likely due to chronic neck pain.  Checking plain films of the cervical spine today. Start amitriptyline 10 mg at bedtime for headache prevention.  He will update in a few weeks.  Orders: -     Amitriptyline HCl; Take 1 tablet (10 mg total) by mouth at bedtime. For headache prevention and pain  Dispense: 90 tablet; Refill: 0  Irregular heart rhythm -     EKG 12-Lead -     TSH -     CBC  Screening for prostate cancer -     PSA  Insomnia, unspecified  type Assessment & Plan: Stable.  Remain off trazodone. Start amitriptyline 10 mg at bedtime for headache prevention.   Bee sting allergy Assessment & Plan: No use of EpiPen in years.  Continue to monitor.    PVC (premature ventricular  contraction) Assessment & Plan: Noted during exam.  EKG with trigeminy PVCs, rate of 89, sinus rhythm.  Reviewed EKG from 2020 which notes occasional PVCs.  Given the change a ZIO Holter monitor was ordered and pending. He declines cardiology evaluation at this time.  Await results.  Orders: -     LONG TERM MONITOR (3-14 DAYS); Future        Doreene Nest, NP

## 2023-05-30 NOTE — Assessment & Plan Note (Signed)
Repeat A1c pending.  Urine microalbumin due and pending. Continue Levemir 30 units daily, metformin 1000 mg twice daily, Trulicity 4.5 mg weekly.  Follow-up in 3 to 6 months based on A1c result.

## 2023-05-30 NOTE — Assessment & Plan Note (Signed)
Uncontrolled, seems likely due to chronic neck pain.  Checking plain films of the cervical spine today. Start amitriptyline 10 mg at bedtime for headache prevention.  He will update in a few weeks.

## 2023-05-30 NOTE — Assessment & Plan Note (Signed)
Continue CPAP nightly. °

## 2023-05-30 NOTE — Assessment & Plan Note (Signed)
Controlled.  Continue bupropion XL 300 mg daily. Continue to monitor.

## 2023-05-30 NOTE — Assessment & Plan Note (Signed)
Immunizations UTD. Influenza vaccine provided today. Colonoscopy UTD, due 2025. PSA due and pending.  Discussed the importance of a healthy diet and regular exercise in order for weight loss, and to reduce the risk of further co-morbidity.  Exam stable. Labs pending.  Follow up in 1 year for repeat physical.

## 2023-05-30 NOTE — Patient Instructions (Addendum)
Complete xray(s) and labs prior to leaving today. I will notify you of your results once received.  Start amitriptyline 10 mg for headache prevention.  Take 1 tablet by mouth every evening at bedtime.  Complete the Holter monitor as discussed.  Please schedule a follow up visit for 6 months for a diabetes check.  It was a pleasure to see you today!

## 2023-06-01 DIAGNOSIS — I493 Ventricular premature depolarization: Secondary | ICD-10-CM

## 2023-06-05 ENCOUNTER — Other Ambulatory Visit: Payer: Self-pay | Admitting: Primary Care

## 2023-06-05 DIAGNOSIS — F3342 Major depressive disorder, recurrent, in full remission: Secondary | ICD-10-CM

## 2023-06-16 IMAGING — CT CT RENAL STONE PROTOCOL
2 of 4 series · 16 of 46 positions shown, 18 images · non-contrast
Comparison: None.

CLINICAL DATA: Left-sided back pain.



[Series 2: stone full · axial · 0.91mm/px · z∈[+872,+1322]mm · 13 of 100 slices shown, 15 images]
[im 5/100  soft-tissue]
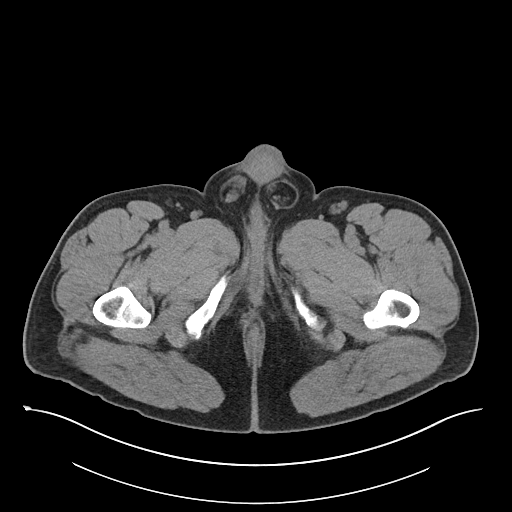
[im 5/100  bone]
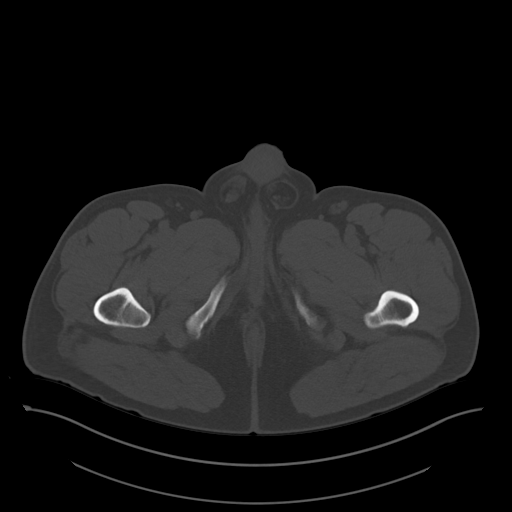
[im 13/100  soft-tissue]
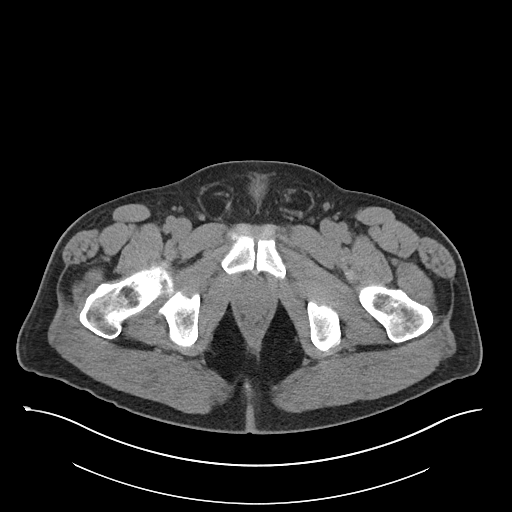
[im 21/100  soft-tissue]
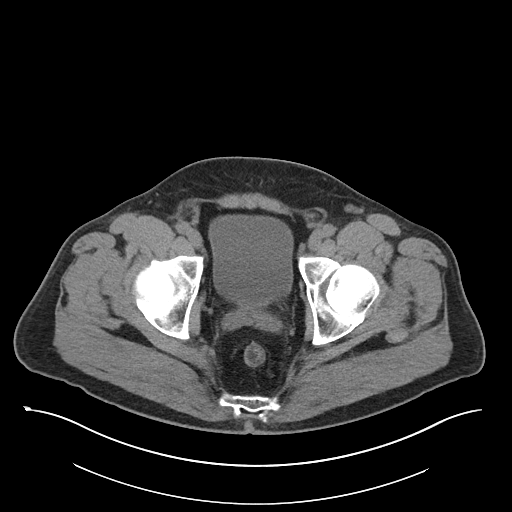
[im 29/100  soft-tissue]
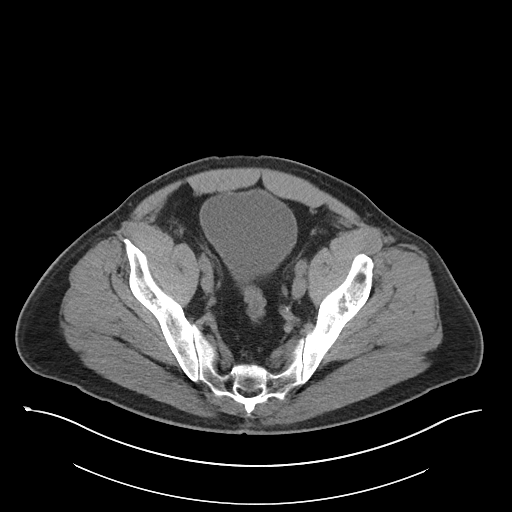
[im 34/100  soft-tissue]
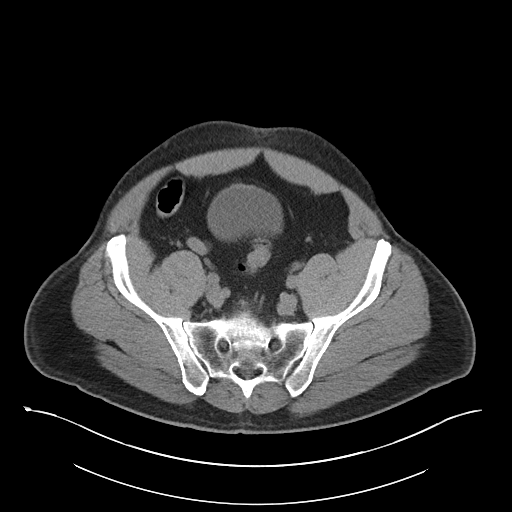
[im 42/100  soft-tissue]
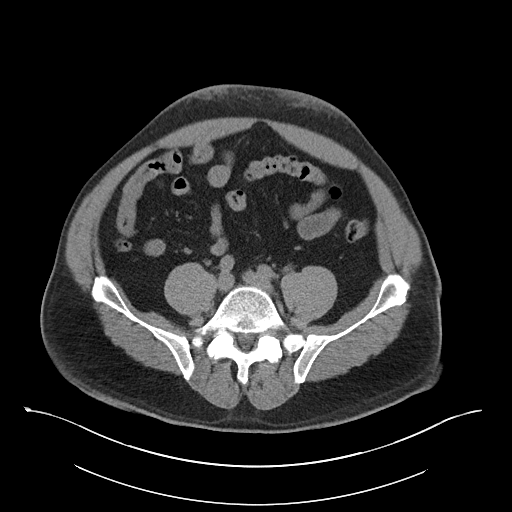
[im 50/100  soft-tissue]
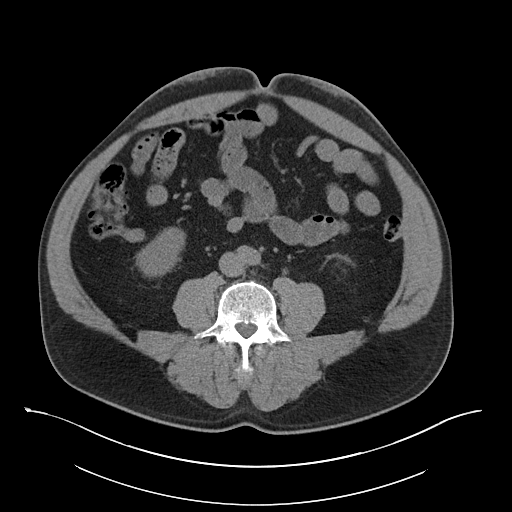
[im 58/100  soft-tissue]
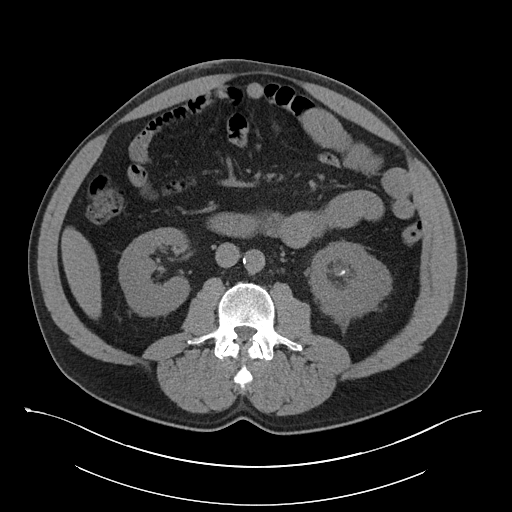
[im 67/100  soft-tissue]
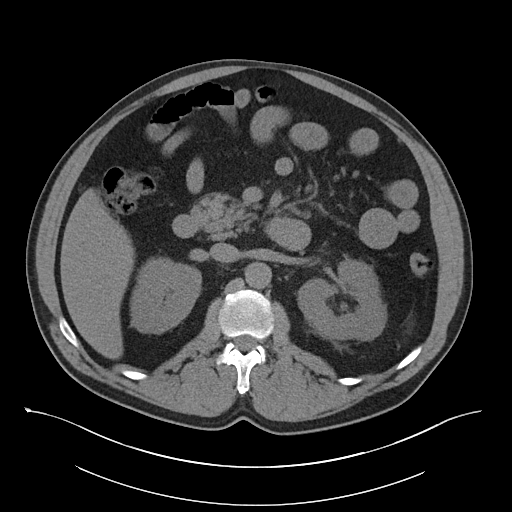
[im 67/100  bone]
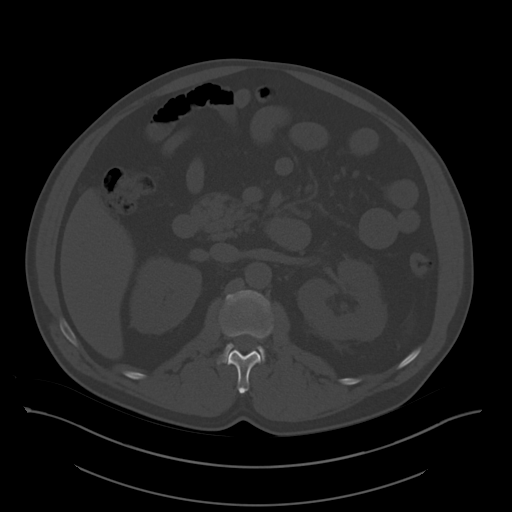
[im 71/100  soft-tissue]
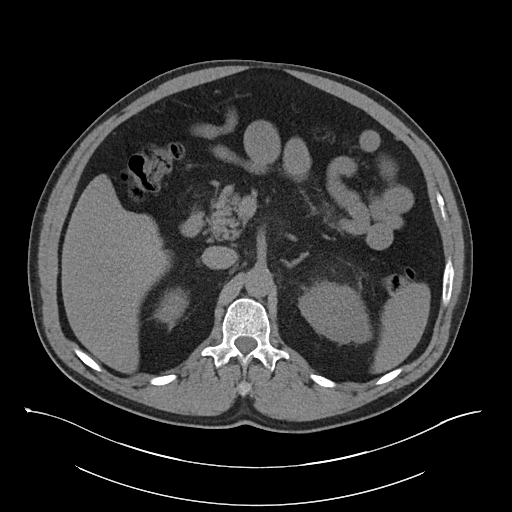
[im 79/100  soft-tissue]
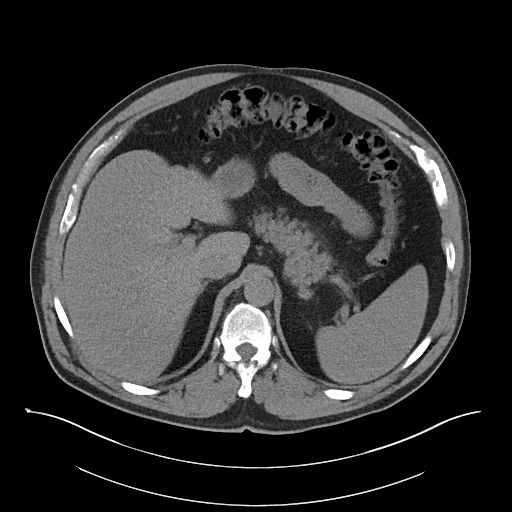
[im 87/100  soft-tissue]
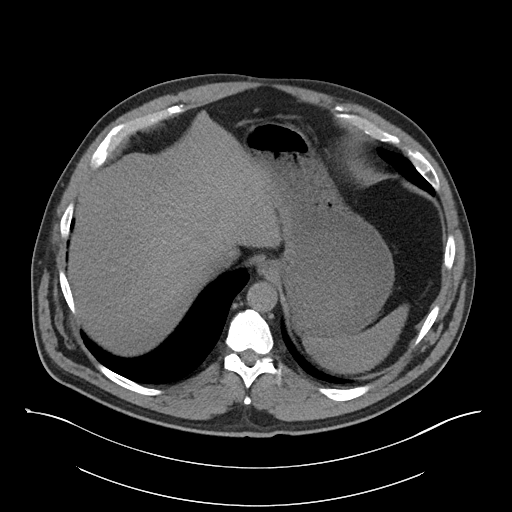
[im 95/100  soft-tissue]
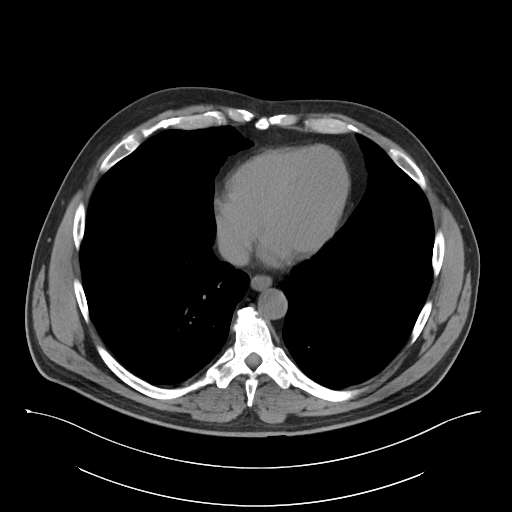

[Series 5: coronal · coronal · 0.89mm/px · 3 of 120 slices shown]
[im 40/120  soft-tissue]
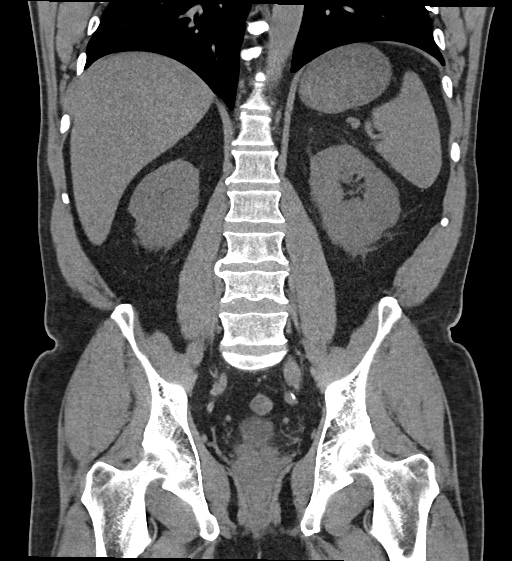
[im 53/120  soft-tissue]
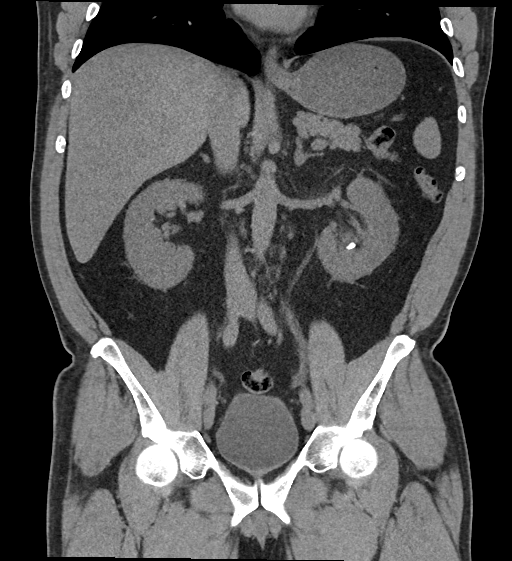
[im 67/120  soft-tissue]
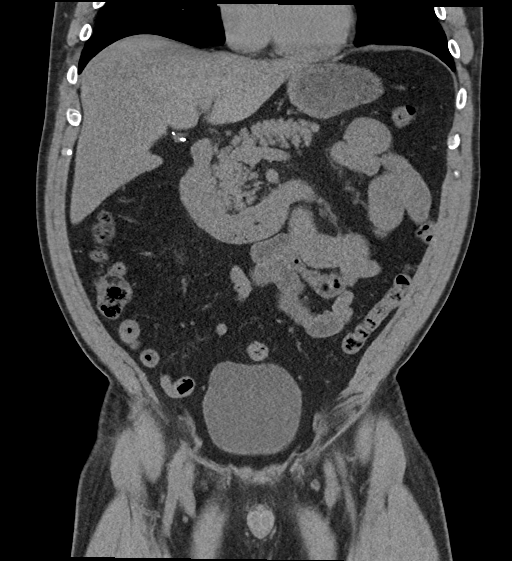

[16 of 46 positions shown; findings below may reference images not displayed]

FINDINGS: Lower chest: No acute abnormality.

Hepatobiliary: Mild diffusely decreased liver density without focal
abnormality. Status post cholecystectomy. No biliary dilatation.

Pancreas: Unremarkable. No pancreatic ductal dilatation or
surrounding inflammatory changes.

Spleen: Normal in size without focal abnormality.

Adrenals/Urinary Tract: Adrenal glands are unremarkable. Punctate
right renal calculus. Punctate and 9 mm left renal calculi. No
ureteral calculi or hydronephrosis. Punctate calculus in the bladder
medial to the left UVJ. The bladder is otherwise unremarkable.

Stomach/Bowel: Stomach is within normal limits. History of prior
appendectomy. No evidence of bowel wall thickening, distention, or
inflammatory changes.

Vascular/Lymphatic: Aortic atherosclerosis. No enlarged abdominal or
pelvic lymph nodes.

Reproductive: Prostate is unremarkable.

Other: Small fat containing right inguinal hernia. No free fluid or
pneumoperitoneum.

Musculoskeletal: No acute or significant osseous findings.
IMPRESSION: 1. Punctate calculus in the bladder medial to the left UVJ,
consistent with recently passed stone.
2. Additional bilateral nonobstructive nephrolithiasis.
3. Mild hepatic steatosis.
4. Aortic Atherosclerosis ([id number]).

## 2023-06-19 ENCOUNTER — Other Ambulatory Visit: Payer: Self-pay | Admitting: Primary Care

## 2023-06-19 DIAGNOSIS — G4700 Insomnia, unspecified: Secondary | ICD-10-CM

## 2023-06-19 DIAGNOSIS — M542 Cervicalgia: Secondary | ICD-10-CM

## 2023-06-19 DIAGNOSIS — R519 Headache, unspecified: Secondary | ICD-10-CM

## 2023-06-23 IMAGING — DX DG ABDOMEN 1V
2 series · 2 of 2 positions shown · non-contrast
Comparison: Radiograph 08/31/2021, CT 08/26/2021

CLINICAL DATA: Nephrolithiasis, pre lithotripsy.

EXAM:
ABDOMEN - 1 VIEW

[abdomen kub (1 of 2)]
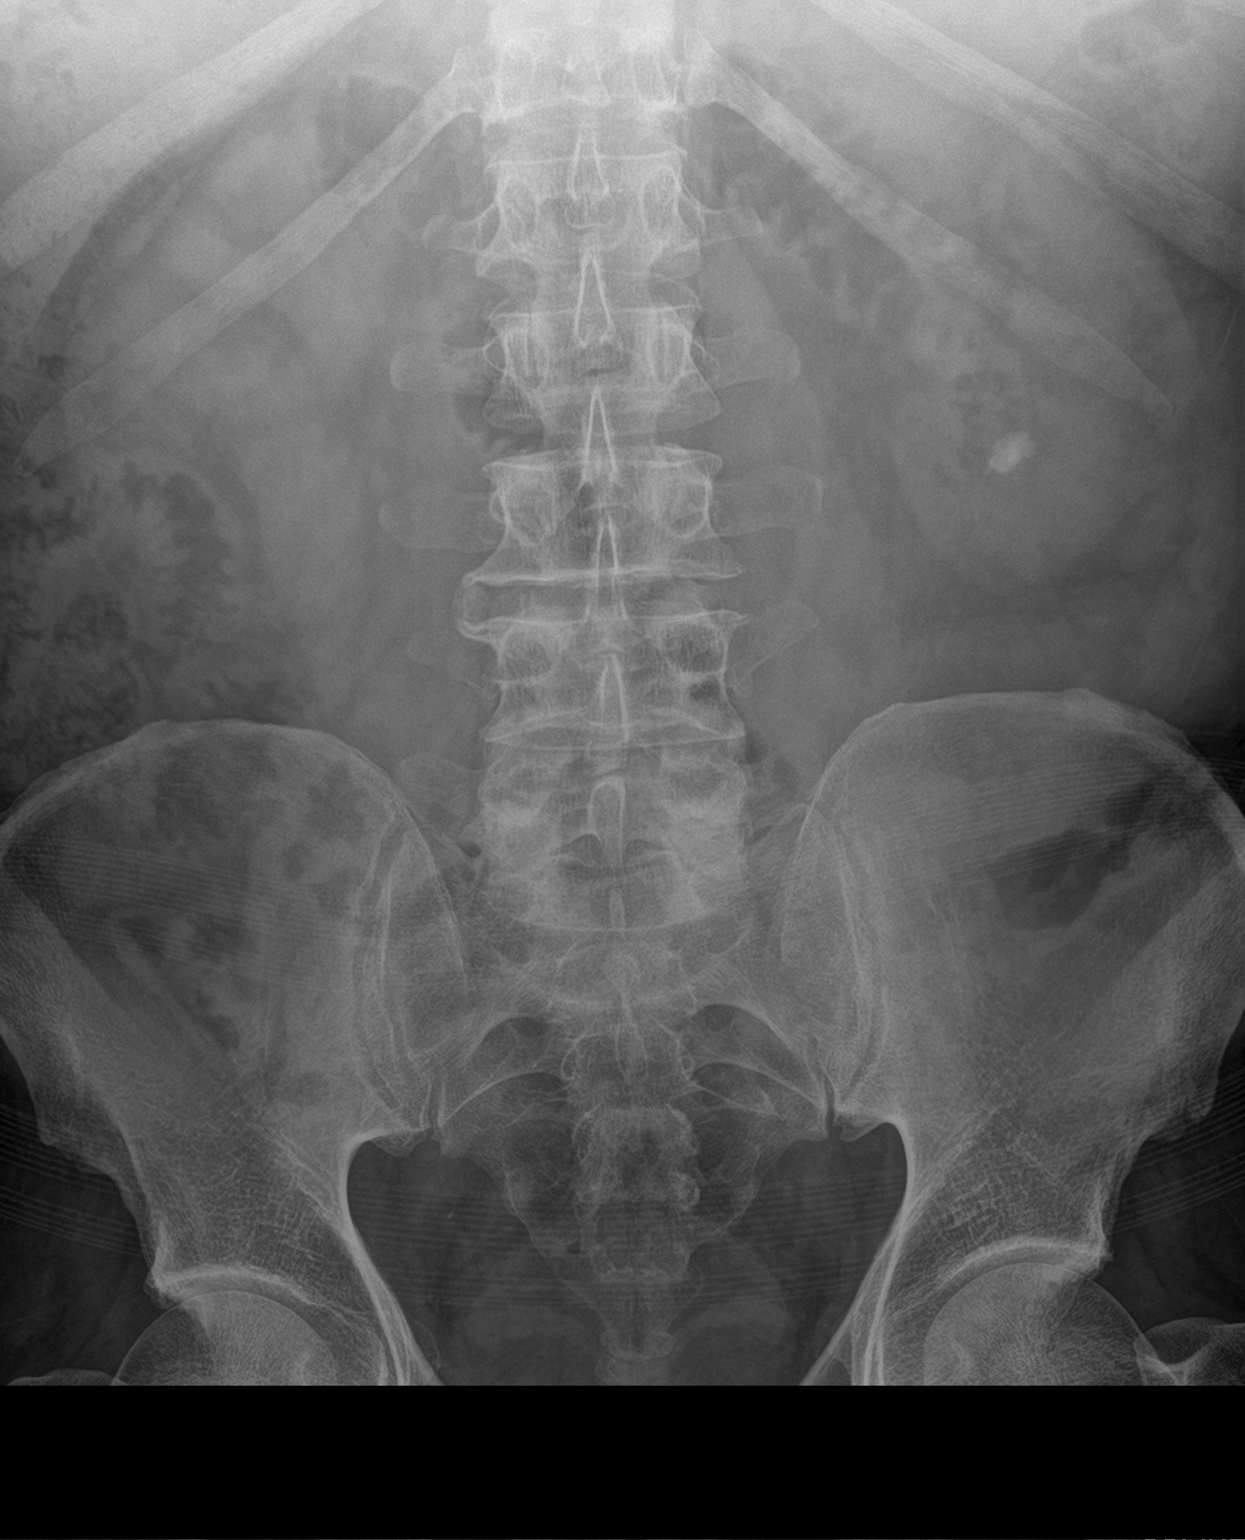

[abdomen kub (2 of 2)]
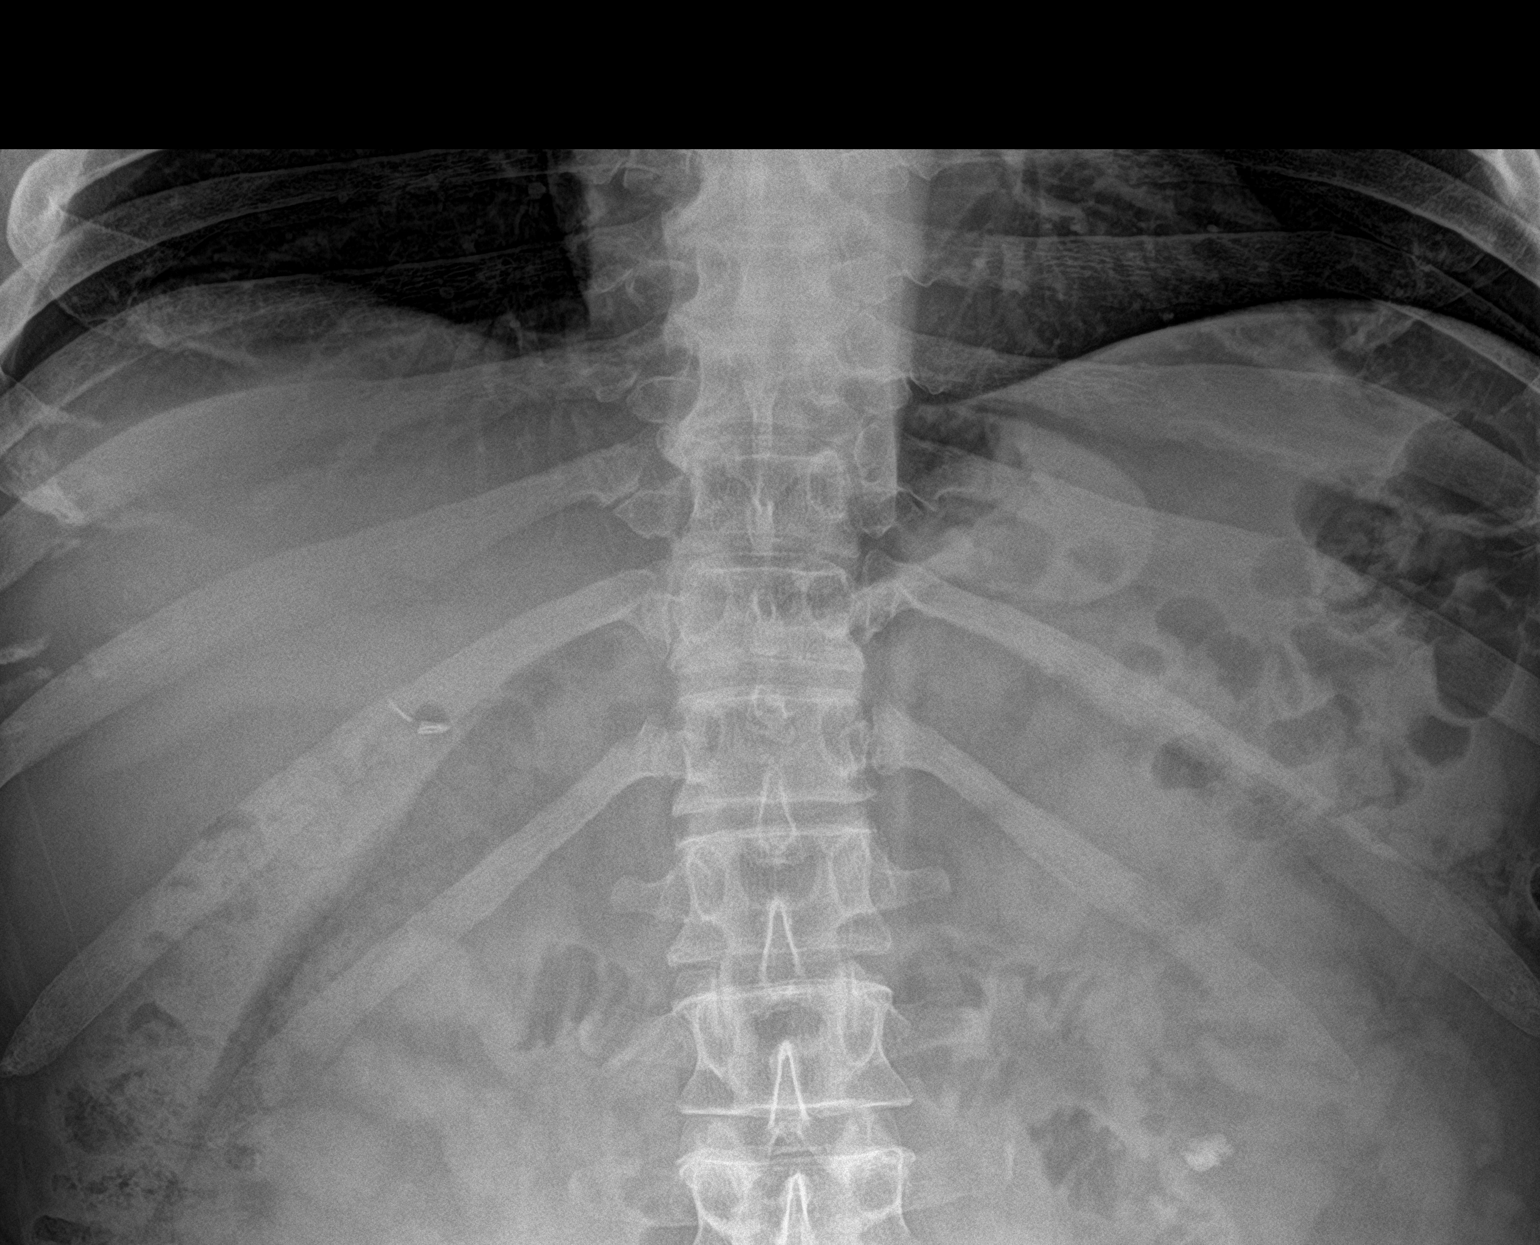

[2 of 2 positions shown; findings below may reference images not displayed]

FINDINGS: The small distal left ureteral calculus on prior CT is not
definitively seen on the current exam. 12 mm stone projecting over
the lower left renal shadow is again seen. Additional small left
renal stones on CT not well seen. Punctate right renal stone on CT
is not well seen by radiograph. Normal bowel gas pattern. No acute
osseous findings.
IMPRESSION: 1. The small distal left ureteral calculus on prior CT is not
definitively seen on the current exam.
2. Left renal stone measures 12 mm. Known additional left renal
stones and right punctate renal calculi not well seen by radiograph.

## 2023-06-26 ENCOUNTER — Ambulatory Visit
Admission: RE | Admit: 2023-06-26 | Discharge: 2023-06-26 | Disposition: A | Discharge status: Home / Self Care | Source: Ambulatory Visit | Attending: Primary Care | Admitting: Primary Care

## 2023-06-26 DIAGNOSIS — R519 Headache, unspecified: Secondary | ICD-10-CM | POA: Diagnosis present

## 2023-06-26 DIAGNOSIS — M542 Cervicalgia: Secondary | ICD-10-CM | POA: Insufficient documentation

## 2023-06-26 MED ORDER — IOHEXOL 300 MG/ML  SOLN
75.0000 mL | Freq: Once | INTRAMUSCULAR | Status: AC | PRN
  Administered 2023-06-26: 75 mL via INTRAVENOUS

## 2023-07-04 DIAGNOSIS — I493 Ventricular premature depolarization: Secondary | ICD-10-CM

## 2023-07-05 ENCOUNTER — Other Ambulatory Visit: Payer: Self-pay | Admitting: Primary Care

## 2023-07-05 DIAGNOSIS — E782 Mixed hyperlipidemia: Secondary | ICD-10-CM

## 2023-07-11 ENCOUNTER — Encounter: Payer: Self-pay | Admitting: Primary Care

## 2023-07-11 DIAGNOSIS — R519 Headache, unspecified: Secondary | ICD-10-CM

## 2023-07-11 DIAGNOSIS — E1165 Type 2 diabetes mellitus with hyperglycemia: Secondary | ICD-10-CM

## 2023-07-11 DIAGNOSIS — E782 Mixed hyperlipidemia: Secondary | ICD-10-CM

## 2023-07-11 NOTE — Telephone Encounter (Signed)
 Care team updated and letter sent for eye exam notes.

## 2023-07-14 MED ORDER — SUMATRIPTAN SUCCINATE 50 MG PO TABS: ORAL_TABLET | ORAL | 0 refills | Status: DC

## 2023-07-20 ENCOUNTER — Ambulatory Visit
Admission: RE | Admit: 2023-07-20 | Discharge: 2023-07-20 | Disposition: A | Discharge status: Home / Self Care | Payer: Self-pay | Source: Ambulatory Visit | Attending: Primary Care | Admitting: Primary Care

## 2023-07-20 DIAGNOSIS — E782 Mixed hyperlipidemia: Secondary | ICD-10-CM | POA: Insufficient documentation

## 2023-07-21 ENCOUNTER — Other Ambulatory Visit: Payer: Self-pay | Admitting: Primary Care

## 2023-07-21 DIAGNOSIS — Z794 Long term (current) use of insulin: Secondary | ICD-10-CM

## 2023-07-25 ENCOUNTER — Other Ambulatory Visit: Payer: Self-pay | Admitting: Primary Care

## 2023-07-25 DIAGNOSIS — E119 Type 2 diabetes mellitus without complications: Secondary | ICD-10-CM

## 2023-08-01 ENCOUNTER — Other Ambulatory Visit: Payer: Self-pay | Admitting: Primary Care

## 2023-08-01 DIAGNOSIS — Z794 Long term (current) use of insulin: Secondary | ICD-10-CM

## 2023-08-11 DIAGNOSIS — G43709 Chronic migraine without aura, not intractable, without status migrainosus: Secondary | ICD-10-CM

## 2023-08-11 DIAGNOSIS — R519 Headache, unspecified: Secondary | ICD-10-CM

## 2023-08-13 MED ORDER — SUMATRIPTAN SUCCINATE 100 MG PO TABS: ORAL_TABLET | ORAL | 0 refills | Status: DC

## 2023-08-13 MED ORDER — TOPIRAMATE 50 MG PO TABS: 50.0000 mg | ORAL_TABLET | Freq: Every day | ORAL | 0 refills | Status: DC

## 2023-08-31 ENCOUNTER — Other Ambulatory Visit: Payer: Self-pay | Admitting: Primary Care

## 2023-08-31 DIAGNOSIS — G43709 Chronic migraine without aura, not intractable, without status migrainosus: Secondary | ICD-10-CM

## 2023-09-01 MED ORDER — SUMATRIPTAN SUCCINATE 100 MG PO TABS: ORAL_TABLET | ORAL | 0 refills | Status: DC

## 2023-09-06 ENCOUNTER — Ambulatory Visit: Admitting: Primary Care

## 2023-09-06 ENCOUNTER — Encounter: Payer: Self-pay | Admitting: Primary Care

## 2023-09-06 VITALS — BP 136/84 | HR 100 | Temp 97.3°F | Ht 68.0 in | Wt 229.0 lb

## 2023-09-06 DIAGNOSIS — E1165 Type 2 diabetes mellitus with hyperglycemia: Secondary | ICD-10-CM | POA: Diagnosis not present

## 2023-09-06 DIAGNOSIS — Z7984 Long term (current) use of oral hypoglycemic drugs: Secondary | ICD-10-CM | POA: Diagnosis not present

## 2023-09-06 DIAGNOSIS — Z7985 Long-term (current) use of injectable non-insulin antidiabetic drugs: Secondary | ICD-10-CM

## 2023-09-06 DIAGNOSIS — Z794 Long term (current) use of insulin: Secondary | ICD-10-CM

## 2023-09-06 DIAGNOSIS — R519 Headache, unspecified: Secondary | ICD-10-CM

## 2023-09-06 LAB — POCT GLYCOSYLATED HEMOGLOBIN (HGB A1C): Hemoglobin A1C: 6.6 % — AB (ref 4.0–5.6)

## 2023-09-06 MED ORDER — CYCLOBENZAPRINE HCL 10 MG PO TABS: ORAL_TABLET | ORAL | 0 refills | Status: AC

## 2023-09-06 NOTE — Assessment & Plan Note (Signed)
Slightly deteriorated but overall under control with A1c of 6.6.  Continue Trulicity 4.5 mg weekly, metformin 1000 mg twice daily, Levemir 30 units daily.  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  Follow-up in 3 to 6 months.

## 2023-09-06 NOTE — Patient Instructions (Signed)
It was a pleasure to see you today!

## 2023-09-06 NOTE — Progress Notes (Signed)
Subjective:    Patient ID: Devon Jackson, male    DOB: [birth year], 58 y.o.   MRN: [id number]  HPI  SKYLEN Jackson is a very pleasant 58 y.o. male with a history of type 2 diabetes, hyperlipidemia, chronic migraines, OSA, GERD, MDD, insomnia who presents today for follow-up of diabetes and form completion, also follow up of migraines.  1) Type 2 Diabetes: Current medications include: Levemir 30 units daily, Trulicity 4.5 mg weekly, metformin 1000 mg twice daily.   He is checking his blood glucose 2 times daily and is getting readings of:  AM fasting: low 100s Before dinner: mid 100s  Last A1C: 6.5 in November 2024, 6.6 today Last Eye Exam: Up-to-date Last Foot Exam: Up-to-date Pneumonia Vaccination: 2017 Urine Microalbumin: Up-to-date Statin: Statin intolerant  Dietary changes since last visit: Smaller portion sizes. Continues to eat out during lunch daily.    Exercise: None. Active at work.    BP Readings from Last 3 Encounters:  09/06/23 136/84  05/30/23 122/60  01/10/23 132/86   2) Chronic Migraines: Chronic for years, over the last few months migraines have become recurrent and bothersome.  Currently managed on topiramate 100 mg at bedtime, sumatriptan 100 mg as needed.  Topiramate amitriptyline were increased in January 2025, migraines have improved.   He underwent plain films and a CT of the cervical spine in January 2025, arthritis.  His migraines initiate to the cervical spine and move upward.  He has not seen orthopedics.  He is managed on cyclobenzaprine as needed to prevent migraines.   Review of Systems  Respiratory:  Negative for shortness of breath.   Cardiovascular:  Negative for chest pain.  Neurological:  Positive for headaches. Negative for numbness.         Past Medical History:  Diagnosis Date   Allergic rhinitis    Frequent headaches    Gout    Hypertriglyceridemia    Sleep apnea    Type 2 diabetes mellitus (HCC)     Social History    Socioeconomic History   Marital status: Married    Spouse name: Not on file   Number of children: Not on file   Years of education: Not on file   Highest education level: Not on file  Occupational History   Not on file  Tobacco Use   Smoking status: Never   Smokeless tobacco: Never  Vaping Use   Vaping status: Never Used  Substance and Sexual Activity   Alcohol use: Yes    Alcohol/week: 0.0 standard drinks of alcohol    Comment: rarely   Drug use: No   Sexual activity: Yes  Other Topics Concern   Not on file  Social History Narrative   Married.   3 children.   Works as a Financial planner.   Enjoys relaxing, spending time outdoors.   Social Drivers of Health   Financial Resource Strain: Low Risk  (09/04/2023)   Overall Financial Resource Strain (CARDIA)    Difficulty of Paying Living Expenses: Not hard at all  Food Insecurity: Patient Declined (09/04/2023)   Hunger Vital Sign    Worried About Running Out of Food in the Last Year: Patient declined    Ran Out of Food in the Last Year: Patient declined  Transportation Needs: Patient Declined (09/04/2023)   PRAPARE - Administrator, Civil Service (Medical): Patient declined    Lack of Transportation (Non-Medical): Patient declined  Physical Activity: Unknown (09/04/2023)   Exercise Vital Sign  Days of Exercise per Week: 0 days    Minutes of Exercise per Session: Not on file  Stress: No Stress Concern Present (09/04/2023)   [employer] of Occupational Health - Occupational Stress Questionnaire    Feeling of Stress : Not at all  Social Connections: Unknown (09/04/2023)   Social Connection and Isolation Panel [NHANES]    Frequency of Communication with Friends and Family: More than three times a week    Frequency of Social Gatherings with Friends and Family: Twice a week    Attends Religious Services: Patient declined    Database administrator or Organizations: Patient declined    Attends Tax inspector Meetings: Not on file    Marital Status: Married  Catering manager Violence: Not on file    Past Surgical History:  Procedure Laterality Date   APPENDECTOMY     EXTRACORPOREAL SHOCK WAVE LITHOTRIPSY Left 09/02/2021   Procedure: LEFT EXTRACORPOREAL SHOCK WAVE LITHOTRIPSY (ESWL);  Surgeon: Alfredo Martinez, MD;  Location: [facility];  Service: Urology;  Laterality: Left;   LITHOTRIPSY  08/2021   POLYPECTOMY     ROTATOR CUFF REPAIR  07/26/2007   SEPTOPLASTY      Family History  Problem Relation Age of Onset   Alcohol abuse Mother    Diabetes Father    Breast cancer Paternal Grandmother    Lung cancer Paternal Grandfather     Allergies  Allergen Reactions   Bee Pollen Anaphylaxis   Bee Venom Anaphylaxis   Statins Other (See Comments)    Memory issues    Current Outpatient Medications on File Prior to Visit  Medication Sig Dispense Refill   albuterol (VENTOLIN HFA) 108 (90 Base) MCG/ACT inhaler USE 2 INHALATIONS EVERY 6 HOURS AS NEEDED 8 g 0   BD PEN NEEDLE NANO U/F 32G X 4 MM MISC USE TO INJECT INSULIN 100 each 0   blood glucose meter kit and supplies KIT Dispense based on patient and insurance preference. Use up to 3 times daily as directed. (Dx is E11.9). 1 each 0   buPROPion (WELLBUTRIN XL) 300 MG 24 hr tablet TAKE 1 TABLET DAILY FOR DEPRESSION 90 tablet 3   clotrimazole-betamethasone (LOTRISONE) cream Apply topically.     DEXILANT 60 MG capsule Take 1 capsule (60 mg total) by mouth daily. For heartburn. 90 capsule 0   Dulaglutide (TRULICITY) 4.5 MG/0.5ML SOAJ INJECT 4.5 MG ONCE WEEKLY AS DIRECTED FOR DIABETES 6 mL 1   EPINEPHrine 0.3 mg/0.3 mL IJ SOAJ injection INJECT 0.3 ML (0.3 MG TOTAL) INTO THE MUSCLE AS NEEDED FOR ANAPHYLAXIS 2 each 0   fenofibrate (TRICOR) 145 MG tablet TAKE 1 TABLET DAILY FOR CHOLESTEROL 90 tablet 2   glucose blood (FREESTYLE LITE) test strip USE UP TO THREE TIMES A DAY AS DIRECTED 300 each 2   insulin detemir (LEVEMIR  FLEXPEN) 100 UNIT/ML FlexPen Inject 30 Units into the skin daily. for diabetes. 30 mL 1   Lancets (FREESTYLE) lancets USE TO TEST BLOOD SUGAR 2 TO 3 TIMES A DAY AND AS DIRECTED 300 each 1   metFORMIN (GLUCOPHAGE) 1000 MG tablet TAKE 1 TABLET TWICE A DAY WITH MEALS FOR DIABETES 180 tablet 1   sildenafil (VIAGRA) 100 MG tablet      SUMAtriptan (IMITREX) 100 MG tablet Take 1 tablet by mouth at migraine onset. May repeat in 2 hours if headache persists or recurs. 10 tablet 0   tadalafil (CIALIS) 20 MG tablet TAKE 1 TABLET 30 MINUTES PRIOR TO INTERCOURSE AS  NEEDED 30 tablet 0   topiramate (TOPAMAX) 50 MG tablet Take 1 tablet (50 mg total) by mouth at bedtime. For headache prevention 90 tablet 0   montelukast (SINGULAIR) 10 MG tablet TAKE 1 TABLET AT BEDTIME FOR ALLERGIES (Patient not taking: Reported on 09/06/2023) 90 tablet 2   timolol (TIMOPTIC) 0.5 % ophthalmic solution Place 1 drop into both eyes daily.     No current facility-administered medications on file prior to visit.    BP 136/84   Pulse 100   Temp (!) 97.3 F (36.3 C) (Temporal)   Ht 5\' 8"  (1.727 m)   Wt 229 lb (103.9 kg)   SpO2 96%   BMI 34.82 kg/m  Objective:   Physical Exam Cardiovascular:     Rate and Rhythm: Normal rate and regular rhythm.  Pulmonary:     Effort: Pulmonary effort is normal.     Breath sounds: Normal breath sounds.  Musculoskeletal:     Cervical back: Neck supple.  Skin:    General: Skin is warm and dry.  Neurological:     Mental Status: He is alert and oriented to person, place, and time.  Psychiatric:        Mood and Affect: Mood normal.           Assessment & Plan:  Type 2 diabetes mellitus with hyperglycemia, with long-term current use of insulin (HCC) Assessment & Plan: Slightly deteriorated but overall under control with A1c of 6.6.  Continue Trulicity 4.5 mg weekly, metformin 1000 mg twice daily, Levemir 30 units daily.  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  Follow-up in 3 to 6  months.  Orders: -     POCT glycosylated hemoglobin (Hb A1C)  Frequent headaches Assessment & Plan: Improving.  Reviewed x-ray and CT of the cervical spine from January 2025. Offered orthopedic evaluation, he will think about this.  Continue topiramate 100 mg at bedtime, cyclobenzaprine 10 mg as needed, sumatriptan 100 mg as needed.  Orders: -     Cyclobenzaprine HCl; Take 1 tablet by mouth once to twice daily as needed for severe headaches.  Dispense: 30 tablet; Refill: 0        Doreene Nest, NP

## 2023-09-06 NOTE — Assessment & Plan Note (Signed)
Improving.  Reviewed x-ray and CT of the cervical spine from January 2025. Offered orthopedic evaluation, he will think about this.  Continue topiramate 100 mg at bedtime, cyclobenzaprine 10 mg as needed, sumatriptan 100 mg as needed.

## 2023-09-08 ENCOUNTER — Ambulatory Visit: Attending: Cardiology | Admitting: Cardiology

## 2023-09-08 ENCOUNTER — Encounter: Payer: Self-pay | Admitting: Cardiology

## 2023-09-08 VITALS — BP 116/84 | HR 90 | Ht 69.0 in | Wt 231.8 lb

## 2023-09-08 DIAGNOSIS — I493 Ventricular premature depolarization: Secondary | ICD-10-CM

## 2023-09-08 DIAGNOSIS — E782 Mixed hyperlipidemia: Secondary | ICD-10-CM | POA: Diagnosis not present

## 2023-09-08 NOTE — Progress Notes (Signed)
Cardiology Office Note:    Date:  09/08/2023   ID:  Devon Jackson, DOB 02-14-1966, MRN [id number]  PCP:  Doreene Nest, NP   [facility] Providers Cardiologist:  None     Referring MD: Doreene Nest, NP   Chief Complaint  Patient presents with   New Patient (Initial Visit)    Referred for cardiac evaluation of frequent PVCs.   Devon Jackson is a 58 y.o. male who is being seen today for the evaluation of frequent PVCs at the request of Doreene Nest, NP.   History of Present Illness:    Devon Jackson is a 58 y.o. male with a hx of diabetes, hypertriglyceridemia, OSA on CPAP presenting with frequent PVCs.  He has been told of having skipped heartbeat in the past during a physical exam.  Followed up with PCP, cardiac monitor was placed showing frequent PVCs 9% burden.  Denies chest pain, has some shortness of breath with overexertion which he attributes to OSA.  He is compliant with his CPAP.  Denies smoking, denies any family history of heart disease.  Endorses drinking caffeinated diet sodas about 2 L daily.  Prior notes/testing Cardiac monitor 06/2023 showed frequent PVCs 9% burden.  No sustained arrhythmias. CT cardiac score 06/2023 was 26.6, 54th percentile.  Past Medical History:  Diagnosis Date   Allergic rhinitis    Frequent headaches    Gout    Hypertriglyceridemia    Sleep apnea    Type 2 diabetes mellitus (HCC)     Past Surgical History:  Procedure Laterality Date   APPENDECTOMY     EXTRACORPOREAL SHOCK WAVE LITHOTRIPSY Left 09/02/2021   Procedure: LEFT EXTRACORPOREAL SHOCK WAVE LITHOTRIPSY (ESWL);  Surgeon: Alfredo Martinez, MD;  Location: [facility];  Service: Urology;  Laterality: Left;   LITHOTRIPSY  08/2021   POLYPECTOMY     ROTATOR CUFF REPAIR  07/26/2007   SEPTOPLASTY      Current Medications: Current Meds  Medication Sig   albuterol (VENTOLIN HFA) 108 (90 Base) MCG/ACT inhaler USE 2 INHALATIONS EVERY  6 HOURS AS NEEDED   BD PEN NEEDLE NANO U/F 32G X 4 MM MISC USE TO INJECT INSULIN   blood glucose meter kit and supplies KIT Dispense based on patient and insurance preference. Use up to 3 times daily as directed. (Dx is E11.9).   buPROPion (WELLBUTRIN XL) 300 MG 24 hr tablet TAKE 1 TABLET DAILY FOR DEPRESSION   clotrimazole-betamethasone (LOTRISONE) cream Apply topically.   cyclobenzaprine (FLEXERIL) 10 MG tablet Take 1 tablet by mouth once to twice daily as needed for severe headaches.   DEXILANT 60 MG capsule Take 1 capsule (60 mg total) by mouth daily. For heartburn.   Dulaglutide (TRULICITY) 4.5 MG/0.5ML SOAJ INJECT 4.5 MG ONCE WEEKLY AS DIRECTED FOR DIABETES   EPINEPHrine 0.3 mg/0.3 mL IJ SOAJ injection INJECT 0.3 ML (0.3 MG TOTAL) INTO THE MUSCLE AS NEEDED FOR ANAPHYLAXIS   fenofibrate (TRICOR) 145 MG tablet TAKE 1 TABLET DAILY FOR CHOLESTEROL   glucose blood (FREESTYLE LITE) test strip USE UP TO THREE TIMES A DAY AS DIRECTED   insulin detemir (LEVEMIR FLEXPEN) 100 UNIT/ML FlexPen Inject 30 Units into the skin daily. for diabetes.   Lancets (FREESTYLE) lancets USE TO TEST BLOOD SUGAR 2 TO 3 TIMES A DAY AND AS DIRECTED   metFORMIN (GLUCOPHAGE) 1000 MG tablet TAKE 1 TABLET TWICE A DAY WITH MEALS FOR DIABETES   sildenafil (VIAGRA) 100 MG tablet    SUMAtriptan (  IMITREX) 100 MG tablet Take 1 tablet by mouth at migraine onset. May repeat in 2 hours if headache persists or recurs.   tadalafil (CIALIS) 20 MG tablet TAKE 1 TABLET 30 MINUTES PRIOR TO INTERCOURSE AS NEEDED   timolol (TIMOPTIC) 0.5 % ophthalmic solution Place 1 drop into both eyes daily.   topiramate (TOPAMAX) 50 MG tablet Take 1 tablet (50 mg total) by mouth at bedtime. For headache prevention     Allergies:   Bee pollen, Bee venom, and Statins   Social History   Socioeconomic History   Marital status: Married    Spouse name: Not on file   Number of children: Not on file   Years of education: Not on file   Highest education  level: Not on file  Occupational History   Not on file  Tobacco Use   Smoking status: Never   Smokeless tobacco: Never  Vaping Use   Vaping status: Never Used  Substance and Sexual Activity   Alcohol use: Not Currently    Comment: rarely   Drug use: No   Sexual activity: Yes    Birth control/protection: Condom  Other Topics Concern   Not on file  Social History Narrative   Married.   3 children.   Works as a Financial planner.   Enjoys relaxing, spending time outdoors.   Social Drivers of Corporate investment banker Strain: Low Risk  (09/04/2023)   Overall Financial Resource Strain (CARDIA)    Difficulty of Paying Living Expenses: Not hard at all  Food Insecurity: Patient Declined (09/04/2023)   Hunger Vital Sign    Worried About Running Out of Food in the Last Year: Patient declined    Ran Out of Food in the Last Year: Patient declined  Transportation Needs: Patient Declined (09/04/2023)   PRAPARE - Administrator, Civil Service (Medical): Patient declined    Lack of Transportation (Non-Medical): Patient declined  Physical Activity: Unknown (09/04/2023)   Exercise Vital Sign    Days of Exercise per Week: 0 days    Minutes of Exercise per Session: Not on file  Stress: No Stress Concern Present (09/04/2023)   [employer] of Occupational Health - Occupational Stress Questionnaire    Feeling of Stress : Not at all  Social Connections: Unknown (09/04/2023)   Social Connection and Isolation Panel [NHANES]    Frequency of Communication with Friends and Family: More than three times a week    Frequency of Social Gatherings with Friends and Family: Twice a week    Attends Religious Services: Patient declined    Database administrator or Organizations: Patient declined    Attends Engineer, structural: Not on file    Marital Status: Married     Family History: The patient's family history includes Alcohol abuse in his mother; Breast cancer in his paternal  grandmother; Diabetes in his father; Lung cancer in his paternal grandfather. There is no history of Heart disease.  ROS:   Please see the history of present illness.     All other systems reviewed and are negative.  EKGs/Labs/Other Studies Reviewed:    The following studies were reviewed today:  EKG Interpretation Date/Time:  Friday September 08 2023 08:32:57 EST Ventricular Rate:  90 PR Interval:  156 QRS Duration:  108 QT Interval:  402 QTC Calculation: 491 R Axis:   10  Text Interpretation: Sinus rhythm with frequent Premature ventricular complexes Confirmed by Debbe Odea ([id number]) on 09/08/2023 8:43:05 AM  Recent Labs: 05/30/2023: ALT 16; BUN 12; Creatinine, Ser 1.11; Hemoglobin 15.2; Platelets 274.0; Potassium 4.3; Sodium 141; TSH 1.36  Recent Lipid Panel    Component Value Date/Time   CHOL 161 05/30/2023 0825   TRIG 99.0 05/30/2023 0825   HDL 35.20 (L) 05/30/2023 0825   CHOLHDL 5 05/30/2023 0825   VLDL 19.8 05/30/2023 0825   LDLCALC 106 (H) 05/30/2023 0825   LDLCALC  11/15/2019 1550     Comment:     . LDL cholesterol not calculated. Triglyceride levels greater than 400 mg/dL invalidate calculated LDL results. . Reference range: <100 . Desirable range <100 mg/dL for primary prevention;   <70 mg/dL for patients with CHD or diabetic patients  with > or = 2 CHD risk factors. Marland Kitchen LDL-C is now calculated using the Martin-Hopkins  calculation, which is a validated novel method providing  better accuracy than the Friedewald equation in the  estimation of LDL-C.  Horald Pollen et al. Lenox Ahr. 4098;119(14): 2061-2068  (http://education.[facility].com/faq/FAQ164)    LDLDIRECT 114.0 11/17/2020 0904     Risk Assessment/Calculations:             Physical Exam:    VS:  BP 116/84 (BP Location: Left Arm, Patient Position: Sitting, Cuff Size: Large)   Pulse 90   Ht 5\' 9"  (1.753 m)   Wt 231 lb 12.8 oz (105.1 kg)   SpO2 97%   BMI 34.23 kg/m     Wt Readings  from Last 3 Encounters:  09/08/23 231 lb 12.8 oz (105.1 kg)  09/06/23 229 lb (103.9 kg)  05/30/23 226 lb (102.5 kg)     GEN:  Well nourished, well developed in no acute distress HEENT: Normal NECK: No JVD; No carotid bruits CARDIAC: Regular rate, occasional skipped beats, no murmurs, rubs, gallops RESPIRATORY:  Clear to auscultation without rales, wheezing or rhonchi  ABDOMEN: Soft, non-tender, non-distended MUSCULOSKELETAL:  No edema; No deformity  SKIN: Warm and dry NEUROLOGIC:  Alert and oriented x 3 PSYCHIATRIC:  Normal affect   ASSESSMENT:    1. Frequent PVCs   2. Mixed hyperlipidemia    PLAN:    In order of problems listed above:  Frequent PVCs, 9% burden.  Patient otherwise asymptomatic.  Obtain echo to rule out any significant structural abnormalities.  Advised to cut back on caffeinated drinks.  Monitor off AV nodal agents for now unless patient becomes symptomatic or EF is low. Hypertriglyceridemia, continue fenofibrate 145 mg daily.  Follow-up after echo      Medication Adjustments/Labs and Tests Ordered: Current medicines are reviewed at length with the patient today.  Concerns regarding medicines are outlined above.  Orders Placed This Encounter  Procedures   EKG 12-Lead   ECHOCARDIOGRAM COMPLETE   No orders of the defined types were placed in this encounter.   Patient Instructions  Medication Instructions:  Your Physician recommend you continue on your current medication as directed.    *If you need a refill on your cardiac medications before your next appointment, please call your pharmacy*   Lab Work: None ordered at this time  If you have labs (blood work) drawn today and your tests are completely normal, you will receive your results only by: MyChart Message (if you have MyChart) OR A paper copy in the mail If you have any lab test that is abnormal or we need to change your treatment, we will call you to review the  results.   Testing/Procedures: Your physician has requested that you have an echocardiogram. Echocardiography is  a painless test that uses sound waves to create images of your heart. It provides your doctor with information about the size and shape of your heart and how well your heart's chambers and valves are working.   You may receive an ultrasound enhancing agent through an IV if needed to better visualize your heart during the echo. This procedure takes approximately one hour.  There are no restrictions for this procedure.  This will take place at [address] [facility] [address], [place] [address]  Please note: We ask at that you not bring children with you during ultrasound (echo/ vascular) testing. Due to room size and safety concerns, children are not allowed in the ultrasound rooms during exams. Our front office staff cannot provide observation of children in our lobby area while testing is being conducted. An adult accompanying a patient to their appointment will only be allowed in the ultrasound room at the discretion of the ultrasound technician under special circumstances. We apologize for any inconvenience.   Follow-Up: At [facility], you and your health needs are our priority.  As part of our continuing mission to provide you with exceptional heart care, we have created designated Provider Care Teams.  These Care Teams include your primary Cardiologist (physician) and Advanced Practice Providers (APPs -  Physician Assistants and Nurse Practitioners) who all work together to provide you with the care you need, when you need it.  We recommend signing up for the patient portal called "MyChart".  Sign up information is provided on this After Visit Summary.  MyChart is used to connect with patients for Virtual Visits (Telemedicine).  Patients are able to view lab/test results, encounter notes, upcoming appointments, etc.  Non-urgent messages can be sent to your  provider as well.   To learn more about what you can do with MyChart, go to [email or web address].    Your next appointment:   2 month(s)  Provider:   You may see Dr Debbe Odea or one of the following Advanced Practice Providers on your designated Care Team:   Nicolasa Ducking, NP Eula Listen, PA-C Cadence Fransico Michael, PA-C Charlsie Quest, NP Carlos Levering, NP       Signed, Debbe Odea, MD  09/08/2023 10:06 AM    [facility]

## 2023-09-08 NOTE — Patient Instructions (Signed)
Medication Instructions:  Your Physician recommend you continue on your current medication as directed.    *If you need a refill on your cardiac medications before your next appointment, please call your pharmacy*   Lab Work: None ordered at this time  If you have labs (blood work) drawn today and your tests are completely normal, you will receive your results only by: MyChart Message (if you have MyChart) OR A paper copy in the mail If you have any lab test that is abnormal or we need to change your treatment, we will call you to review the results.   Testing/Procedures: Your physician has requested that you have an echocardiogram. Echocardiography is a painless test that uses sound waves to create images of your heart. It provides your doctor with information about the size and shape of your heart and how well your heart's chambers and valves are working.   You may receive an ultrasound enhancing agent through an IV if needed to better visualize your heart during the echo. This procedure takes approximately one hour.  There are no restrictions for this procedure.  This will take place at [address] [facility] [address]) [address], [place] [address]  Please note: We ask at that you not bring children with you during ultrasound (echo/ vascular) testing. Due to room size and safety concerns, children are not allowed in the ultrasound rooms during exams. Our front office staff cannot provide observation of children in our lobby area while testing is being conducted. An adult accompanying a patient to their appointment will only be allowed in the ultrasound room at the discretion of the ultrasound technician under special circumstances. We apologize for any inconvenience.   Follow-Up: At [facility], you and your health needs are our priority.  As part of our continuing mission to provide you with exceptional heart care, we have created designated Provider Care Teams.  These  Care Teams include your primary Cardiologist (physician) and Advanced Practice Providers (APPs -  Physician Assistants and Nurse Practitioners) who all work together to provide you with the care you need, when you need it.  We recommend signing up for the patient portal called "MyChart".  Sign up information is provided on this After Visit Summary.  MyChart is used to connect with patients for Virtual Visits (Telemedicine).  Patients are able to view lab/test results, encounter notes, upcoming appointments, etc.  Non-urgent messages can be sent to your provider as well.   To learn more about what you can do with MyChart, go to [email or web address].    Your next appointment:   2 month(s)  Provider:   You may see Dr Debbe Odea or one of the following Advanced Practice Providers on your designated Care Team:   Nicolasa Ducking, NP Eula Listen, PA-C Cadence Fransico Michael, PA-C Charlsie Quest, NP Carlos Levering, NP

## 2023-09-12 DIAGNOSIS — Z794 Long term (current) use of insulin: Secondary | ICD-10-CM

## 2023-09-12 DIAGNOSIS — E1165 Type 2 diabetes mellitus with hyperglycemia: Secondary | ICD-10-CM

## 2023-09-12 MED ORDER — FREESTYLE LANCETS MISC: 1 refills | Status: DC

## 2023-10-03 ENCOUNTER — Ambulatory Visit: Attending: Cardiology

## 2023-10-03 DIAGNOSIS — I493 Ventricular premature depolarization: Secondary | ICD-10-CM | POA: Diagnosis not present

## 2023-10-03 DIAGNOSIS — R519 Headache, unspecified: Secondary | ICD-10-CM

## 2023-10-03 LAB — ECHOCARDIOGRAM COMPLETE
AV Mean grad: 2 mmHg
AV Peak grad: 3.5 mmHg
Ao pk vel: 0.94 m/s
Area-P 1/2: 5.27 cm2
S' Lateral: 2.9 cm

## 2023-10-03 MED ORDER — TOPIRAMATE 100 MG PO TABS: 100.0000 mg | ORAL_TABLET | Freq: Every day | ORAL | 1 refills | Status: DC

## 2023-10-18 DIAGNOSIS — G43709 Chronic migraine without aura, not intractable, without status migrainosus: Secondary | ICD-10-CM

## 2023-10-19 MED ORDER — SUMATRIPTAN SUCCINATE 100 MG PO TABS: ORAL_TABLET | ORAL | 0 refills | Status: DC

## 2023-11-16 ENCOUNTER — Ambulatory Visit: Attending: Cardiology | Admitting: Cardiology

## 2023-11-16 ENCOUNTER — Encounter: Payer: Self-pay | Admitting: Cardiology

## 2023-11-16 VITALS — BP 130/70 | HR 97 | Ht 69.0 in | Wt 230.0 lb

## 2023-11-16 DIAGNOSIS — I77810 Thoracic aortic ectasia: Secondary | ICD-10-CM

## 2023-11-16 DIAGNOSIS — I493 Ventricular premature depolarization: Secondary | ICD-10-CM | POA: Diagnosis not present

## 2023-11-16 DIAGNOSIS — E782 Mixed hyperlipidemia: Secondary | ICD-10-CM | POA: Diagnosis not present

## 2023-11-16 NOTE — Patient Instructions (Signed)
 Medication Instructions:  Your physician recommends that you continue on your current medications as directed. Please refer to the Current Medication list given to you today.  *If you need a refill on your cardiac medications before your next appointment, please call your pharmacy*  Lab Work: NONE   If you have labs (blood work) drawn today and your tests are completely normal, you will receive your results only by: MyChart Message (if you have MyChart) OR A paper copy in the mail If you have any lab test that is abnormal or we need to change your treatment, we will call you to review the results.  Testing/Procedures: Your physician has requested that you have an echocardiogram. Echocardiography is a painless test that uses sound waves to create images of your heart. It provides your doctor with information about the size and shape of your heart and how well your heart's chambers and valves are working. This procedure takes approximately one hour. There are no restrictions for this procedure. Please do NOT wear cologne, perfume, aftershave, or lotions (deodorant is allowed). Please arrive 15 minutes prior to your appointment time.  Please note: We ask at that you not bring children with you during ultrasound (echo/ vascular) testing. Due to room size and safety concerns, children are not allowed in the ultrasound rooms during exams. Our front office staff cannot provide observation of children in our lobby area while testing is being conducted. An adult accompanying a patient to their appointment will only be allowed in the ultrasound room at the discretion of the ultrasound technician under special circumstances. We apologize for any inconvenience.   Follow-Up: At [facility], you and your health needs are our priority.  As part of our continuing mission to provide you with exceptional heart care, our providers are all part of one team.  This team includes your primary Cardiologist  (physician) and Advanced Practice Providers or APPs (Physician Assistants and Nurse Practitioners) who all work together to provide you with the care you need, when you need it.  Your next appointment:   1 year(s)  Provider:   Constancia Delton, MD    We recommend signing up for the patient portal called "MyChart".  Sign up information is provided on this After Visit Summary.  MyChart is used to connect with patients for Virtual Visits (Telemedicine).  Patients are able to view lab/test results, encounter notes, upcoming appointments, etc.  Non-urgent messages can be sent to your provider as well.   To learn more about what you can do with MyChart, go to [email or web address].   Other Instructions Thank you for choosing [facility]!

## 2023-11-16 NOTE — Progress Notes (Signed)
 Cardiology Office Note:    Date:  11/16/2023   ID:  Devon Jackson, DOB [birth year], MRN [id number]  PCP:  Gabriel John, NP   [facility] Providers Cardiologist:  Constancia Delton, MD     Referring MD: Gabriel John, NP   Chief Complaint  Patient presents with   2 month follow up     Discuss Echo results. "Doing well."     History of Present Illness:    Devon Jackson is a 58 y.o. male with a hx of frequent PVCs, diabetes, hypertriglyceridemia, OSA on CPAP presenting for follow-up.  Patient was last seen due to frequent PVCs, echocardiogram obtained to evaluate any significant structural abnormalities.  He denies chest pain, denies palpitations.  Feels well, has no concerns at this time.  Presents for echocardiogram results.   Prior notes/testing Cardiac monitor 06/2023 showed frequent PVCs 9% burden.  No sustained arrhythmias. CT cardiac score 06/2023 was 26.6, 54th percentile.  Past Medical History:  Diagnosis Date   Allergic rhinitis    Frequent headaches    Gout    Hypertriglyceridemia    Sleep apnea    Type 2 diabetes mellitus (HCC)     Past Surgical History:  Procedure Laterality Date   APPENDECTOMY     EXTRACORPOREAL SHOCK WAVE LITHOTRIPSY Left 09/02/2021   Procedure: LEFT EXTRACORPOREAL SHOCK WAVE LITHOTRIPSY (ESWL);  Surgeon: Erman Hayward, MD;  Location: [facility];  Service: Urology;  Laterality: Left;   LITHOTRIPSY  08/2021   POLYPECTOMY     ROTATOR CUFF REPAIR  07/26/2007   SEPTOPLASTY      Current Medications: Current Meds  Medication Sig   albuterol  (VENTOLIN  HFA) 108 (90 Base) MCG/ACT inhaler USE 2 INHALATIONS EVERY 6 HOURS AS NEEDED   blood glucose meter kit and supplies KIT Dispense based on patient and insurance preference. Use up to 3 times daily as directed. (Dx is E11.9).   buPROPion  (WELLBUTRIN  XL) 300 MG 24 hr tablet TAKE 1 TABLET DAILY FOR DEPRESSION   clotrimazole-betamethasone (LOTRISONE)  cream Apply topically.   cyclobenzaprine  (FLEXERIL ) 10 MG tablet Take 1 tablet by mouth once to twice daily as needed for severe headaches.   DEXILANT  60 MG capsule Take 1 capsule (60 mg total) by mouth daily. For heartburn.   Dulaglutide  (TRULICITY ) 4.5 MG/0.5ML SOAJ INJECT 4.5 MG ONCE WEEKLY AS DIRECTED FOR DIABETES   fenofibrate  (TRICOR ) 145 MG tablet TAKE 1 TABLET DAILY FOR CHOLESTEROL   glucose blood (FREESTYLE LITE) test strip USE UP TO THREE TIMES A DAY AS DIRECTED   insulin  detemir (LEVEMIR  FLEXPEN) 100 UNIT/ML FlexPen Inject 30 Units into the skin daily. for diabetes.   Lancets (FREESTYLE) lancets USE TO TEST BLOOD SUGAR 2 TO 3 TIMES A DAY AND AS DIRECTED   metFORMIN  (GLUCOPHAGE ) 1000 MG tablet TAKE 1 TABLET TWICE A DAY WITH MEALS FOR DIABETES   montelukast  (SINGULAIR ) 10 MG tablet TAKE 1 TABLET AT BEDTIME FOR ALLERGIES   sildenafil (VIAGRA) 100 MG tablet    SUMAtriptan  (IMITREX ) 100 MG tablet Take 1 tablet by mouth at migraine onset. May repeat in 2 hours if headache persists or recurs.   tadalafil  (CIALIS ) 20 MG tablet TAKE 1 TABLET 30 MINUTES PRIOR TO INTERCOURSE AS NEEDED   timolol (TIMOPTIC) 0.5 % ophthalmic solution Place 1 drop into both eyes daily.   topiramate  (TOPAMAX ) 100 MG tablet Take 1 tablet (100 mg total) by mouth at bedtime. For headaches     Allergies:   Bee pollen, Bee  venom, and Statins   Social History   Socioeconomic History   Marital status: Married    Spouse name: Not on file   Number of children: Not on file   Years of education: Not on file   Highest education level: Not on file  Occupational History   Not on file  Tobacco Use   Smoking status: Never   Smokeless tobacco: Never  Vaping Use   Vaping status: Never Used  Substance and Sexual Activity   Alcohol use: Not Currently    Comment: rarely   Drug use: No   Sexual activity: Yes    Birth control/protection: Condom  Other Topics Concern   Not on file  Social History Narrative   Married.    3 children.   Works as a Financial planner.   Enjoys relaxing, spending time outdoors.   Social Drivers of Corporate investment banker Strain: Low Risk  (09/04/2023)   Overall Financial Resource Strain (CARDIA)    Difficulty of Paying Living Expenses: Not hard at all  Food Insecurity: Patient Declined (09/04/2023)   Hunger Vital Sign    Worried About Running Out of Food in the Last Year: Patient declined    Ran Out of Food in the Last Year: Patient declined  Transportation Needs: Patient Declined (09/04/2023)   PRAPARE - Administrator, Civil Service (Medical): Patient declined    Lack of Transportation (Non-Medical): Patient declined  Physical Activity: Unknown (09/04/2023)   Exercise Vital Sign    Days of Exercise per Week: 0 days    Minutes of Exercise per Session: Not on file  Stress: No Stress Concern Present (09/04/2023)   [employer] of Occupational Health - Occupational Stress Questionnaire    Feeling of Stress : Not at all  Social Connections: Unknown (09/04/2023)   Social Connection and Isolation Panel [NHANES]    Frequency of Communication with Friends and Family: More than three times a week    Frequency of Social Gatherings with Friends and Family: Twice a week    Attends Religious Services: Patient declined    Database administrator or Organizations: Patient declined    Attends Engineer, structural: Not on file    Marital Status: Married     Family History: The patient's family history includes Alcohol abuse in his mother; Breast cancer in his paternal grandmother; Diabetes in his father; Lung cancer in his paternal grandfather. There is no history of Heart disease.  ROS:   Please see the history of present illness.     All other systems reviewed and are negative.  EKGs/Labs/Other Studies Reviewed:    The following studies were reviewed today:       Recent Labs: 05/30/2023: ALT 16; BUN 12; Creatinine, Ser 1.11; Hemoglobin 15.2;  Platelets 274.0; Potassium 4.3; Sodium 141; TSH 1.36  Recent Lipid Panel    Component Value Date/Time   CHOL 161 05/30/2023 0825   TRIG 99.0 05/30/2023 0825   HDL 35.20 (L) 05/30/2023 0825   CHOLHDL 5 05/30/2023 0825   VLDL 19.8 05/30/2023 0825   LDLCALC 106 (H) 05/30/2023 0825   LDLCALC  11/15/2019 1550     Comment:     . LDL cholesterol not calculated. Triglyceride levels greater than 400 mg/dL invalidate calculated LDL results. . Reference range: <100 . Desirable range <100 mg/dL for primary prevention;   <70 mg/dL for patients with CHD or diabetic patients  with > or = 2 CHD risk factors. Aaron Aas LDL-C is now  calculated using the Martin-Hopkins  calculation, which is a validated novel method providing  better accuracy than the Friedewald equation in the  estimation of LDL-C.  Melinda Sprawls et al. Erroll Heard. 5621;308(65): 2061-2068  (http://education.[employer].com/faq/FAQ164)    LDLDIRECT 114.0 11/17/2020 0904     Risk Assessment/Calculations:             Physical Exam:    VS:  BP 130/70 (BP Location: Left Arm, Patient Position: Sitting, Cuff Size: Normal)   Pulse 97   Ht 5\' 9"  (1.753 m)   Wt 230 lb (104.3 kg)   SpO2 98%   BMI 33.97 kg/m     Wt Readings from Last 3 Encounters:  11/16/23 230 lb (104.3 kg)  09/08/23 231 lb 12.8 oz (105.1 kg)  09/06/23 229 lb (103.9 kg)     GEN:  Well nourished, well developed in no acute distress HEENT: Normal NECK: No JVD; No carotid bruits CARDIAC: Regular rate, occasional skipped beats, no murmurs, rubs, gallops RESPIRATORY:  Clear to auscultation without rales, wheezing or rhonchi  ABDOMEN: Soft, non-tender, non-distended MUSCULOSKELETAL:  No edema; No deformity  SKIN: Warm and dry NEUROLOGIC:  Alert and oriented x 3 PSYCHIATRIC:  Normal affect   ASSESSMENT:    1. Frequent PVCs   2. Aortic root dilation (HCC)   3. Mixed hyperlipidemia    PLAN:    In order of problems listed above:  Frequent PVCs, 9% burden.   Patient otherwise asymptomatic.  Echo with normal systolic function, EF 55 to 60%, mild aortic root dilatation measuring 40 mm.  Overall no significant structural abnormalities.  Continue to monitor patient off AV nodal agents.  Mild aortic root dilatation, repeat echo in 1 year to monitor aortic root size.  If size stays the same, reduce frequency to every 3-5 years as per ACC/AHA guidelines. Hypertriglyceridemia, continue fenofibrate  145 mg daily.  Follow-up in 1 year after repeat echo.      Medication Adjustments/Labs and Tests Ordered: Current medicines are reviewed at length with the patient today.  Concerns regarding medicines are outlined above.  Orders Placed This Encounter  Procedures   ECHOCARDIOGRAM COMPLETE   No orders of the defined types were placed in this encounter.   Patient Instructions  Medication Instructions:  Your physician recommends that you continue on your current medications as directed. Please refer to the Current Medication list given to you today.  *If you need a refill on your cardiac medications before your next appointment, please call your pharmacy*  Lab Work: NONE   If you have labs (blood work) drawn today and your tests are completely normal, you will receive your results only by: MyChart Message (if you have MyChart) OR A paper copy in the mail If you have any lab test that is abnormal or we need to change your treatment, we will call you to review the results.  Testing/Procedures: Your physician has requested that you have an echocardiogram. Echocardiography is a painless test that uses sound waves to create images of your heart. It provides your doctor with information about the size and shape of your heart and how well your heart's chambers and valves are working. This procedure takes approximately one hour. There are no restrictions for this procedure. Please do NOT wear cologne, perfume, aftershave, or lotions (deodorant is allowed). Please  arrive 15 minutes prior to your appointment time.  Please note: We ask at that you not bring children with you during ultrasound (echo/ vascular) testing. Due to room size and safety  concerns, children are not allowed in the ultrasound rooms during exams. Our front office staff cannot provide observation of children in our lobby area while testing is being conducted. An adult accompanying a patient to their appointment will only be allowed in the ultrasound room at the discretion of the ultrasound technician under special circumstances. We apologize for any inconvenience.   Follow-Up: At [facility], you and your health needs are our priority.  As part of our continuing mission to provide you with exceptional heart care, our providers are all part of one team.  This team includes your primary Cardiologist (physician) and Advanced Practice Providers or APPs (Physician Assistants and Nurse Practitioners) who all work together to provide you with the care you need, when you need it.  Your next appointment:   1 year(s)  Provider:   Constancia Delton, MD    We recommend signing up for the patient portal called "MyChart".  Sign up information is provided on this After Visit Summary.  MyChart is used to connect with patients for Virtual Visits (Telemedicine).  Patients are able to view lab/test results, encounter notes, upcoming appointments, etc.  Non-urgent messages can be sent to your provider as well.   To learn more about what you can do with MyChart, go to [email or web address].   Other Instructions Thank you for choosing [facility]!           Signed, Constancia Delton, MD  11/16/2023 1:34 PM    [facility]

## 2023-11-18 ENCOUNTER — Other Ambulatory Visit: Payer: Self-pay | Admitting: Primary Care

## 2023-11-18 DIAGNOSIS — E119 Type 2 diabetes mellitus without complications: Secondary | ICD-10-CM

## 2023-11-20 ENCOUNTER — Other Ambulatory Visit: Payer: Self-pay | Admitting: Primary Care

## 2023-11-20 DIAGNOSIS — G43709 Chronic migraine without aura, not intractable, without status migrainosus: Secondary | ICD-10-CM

## 2023-11-28 ENCOUNTER — Encounter: Payer: Self-pay | Admitting: Primary Care

## 2023-11-28 ENCOUNTER — Ambulatory Visit: Admitting: Primary Care

## 2023-11-28 VITALS — BP 132/74 | HR 56 | Temp 97.4°F | Ht 69.0 in | Wt 230.0 lb

## 2023-11-28 DIAGNOSIS — Z794 Long term (current) use of insulin: Secondary | ICD-10-CM | POA: Diagnosis not present

## 2023-11-28 DIAGNOSIS — Z91030 Bee allergy status: Secondary | ICD-10-CM | POA: Diagnosis not present

## 2023-11-28 DIAGNOSIS — E1165 Type 2 diabetes mellitus with hyperglycemia: Secondary | ICD-10-CM

## 2023-11-28 DIAGNOSIS — G43709 Chronic migraine without aura, not intractable, without status migrainosus: Secondary | ICD-10-CM | POA: Insufficient documentation

## 2023-11-28 DIAGNOSIS — R519 Headache, unspecified: Secondary | ICD-10-CM

## 2023-11-28 DIAGNOSIS — M5432 Sciatica, left side: Secondary | ICD-10-CM | POA: Diagnosis not present

## 2023-11-28 LAB — POCT GLYCOSYLATED HEMOGLOBIN (HGB A1C): Hemoglobin A1C: 7.4 % — AB (ref 4.0–5.6)

## 2023-11-28 MED ORDER — PROPRANOLOL HCL ER 80 MG PO CP24: 80.0000 mg | ORAL_CAPSULE | Freq: Every day | ORAL | 0 refills | Status: DC

## 2023-11-28 MED ORDER — EPINEPHRINE 0.3 MG/0.3ML IJ SOAJ: 0.3000 mg | INTRAMUSCULAR | 0 refills | Status: AC | PRN

## 2023-11-28 NOTE — Progress Notes (Signed)
 Subjective:    Patient ID: Devon Jackson, male    DOB: 13-Aug-1965, 58 y.o.   MRN: [id number]  HPI  Devon Jackson is a very pleasant 58 y.o. male with a history of type 2 diabetes, OSA, hyperlipidemia who presents today for follow-up diabetes and headaches.  He would also like to mention sciatica symptoms.  He is also needing a refill of his EpiPen  due to expiration.  1) Type 2 Diabetes: Current medications include: Trulicity  4.5 mg weekly, Levemir  30 units daily, metformin  1000 mg twice daily.  He increased his Levemir  to 35 units a few week ago  He is checking his blood glucose 2 times daily and is getting readings of  AM fasting: 120-130s Before dinner: 120-180s   Last A1C: 6.6 in February 2025, 7.4 today  Last Eye Exam: Up-to-date Last Foot Exam: Due Pneumonia Vaccination: 2017 Urine Microalbumin: Up-to-date Statin: Statin intolerant  Dietary changes since last visit: Increased consumption of pasta, rice, carbs. Recent consumption of sweet tea to help reduce migraines.   Exercise: None  2) Frequent Headaches/Migraines: Chronic. Currently managed on Topamax  100 mg HS for prevention and sumatriptan  100 mg PRN. Headaches occur 3 times weekly on average if he doesn't drink caffeine in the morning. He has to take Goody Powder or naproxen on a daily basis to prevent a migraine.   He failed amitriptyline  previously. He has not undergone MRI brain. He has never seen neurology.   BP Readings from Last 3 Encounters:  11/28/23 132/74  11/16/23 130/70  09/08/23 116/84   3) Left Sciatica: Located to the left buttocks with radiation down to his calf. Chronic and intermittent for years. Overall infrequent. Recent symptoms began 2 months ago and occur daily with prolonged sitting in his truck for work. Has to get out of his truck to walk, symptoms resolve.  He denies numbness, loss of bowel/bladder control, back pain, injury.   Review of Systems  Eyes:  Positive for photophobia.   Respiratory:  Negative for shortness of breath.   Cardiovascular:  Negative for chest pain.  Neurological:  Positive for headaches.         Past Medical History:  Diagnosis Date   Allergic rhinitis    Frequent headaches    Gout    Hypertriglyceridemia    Sleep apnea    Type 2 diabetes mellitus (HCC)     Social History   Socioeconomic History   Marital status: Married    Spouse name: Not on file   Number of children: Not on file   Years of education: Not on file   Highest education level: Not on file  Occupational History   Not on file  Tobacco Use   Smoking status: Never   Smokeless tobacco: Never  Vaping Use   Vaping status: Never Used  Substance and Sexual Activity   Alcohol use: Not Currently    Comment: rarely   Drug use: No   Sexual activity: Yes    Birth control/protection: Condom  Other Topics Concern   Not on file  Social History Narrative   Married.   3 children.   Works as a Financial planner.   Enjoys relaxing, spending time outdoors.   Social Drivers of Health   Financial Resource Strain: Low Risk  (09/04/2023)   Overall Financial Resource Strain (CARDIA)    Difficulty of Paying Living Expenses: Not hard at all  Food Insecurity: Patient Declined (09/04/2023)   Hunger Vital Sign    Worried About Running  Out of Food in the Last Year: Patient declined    Ran Out of Food in the Last Year: Patient declined  Transportation Needs: Patient Declined (09/04/2023)   PRAPARE - Administrator, Civil Service (Medical): Patient declined    Lack of Transportation (Non-Medical): Patient declined  Physical Activity: Unknown (09/04/2023)   Exercise Vital Sign    Days of Exercise per Week: 0 days    Minutes of Exercise per Session: Not on file  Stress: No Stress Concern Present (09/04/2023)   [employer] of Occupational Health - Occupational Stress Questionnaire    Feeling of Stress : Not at all  Social Connections: Unknown (09/04/2023)   Social  Connection and Isolation Panel [NHANES]    Frequency of Communication with Friends and Family: More than three times a week    Frequency of Social Gatherings with Friends and Family: Twice a week    Attends Religious Services: Patient declined    Database administrator or Organizations: Patient declined    Attends Banker Meetings: Not on file    Marital Status: Married  Catering manager Violence: Not on file    Past Surgical History:  Procedure Laterality Date   APPENDECTOMY     EXTRACORPOREAL SHOCK WAVE LITHOTRIPSY Left 09/02/2021   Procedure: LEFT EXTRACORPOREAL SHOCK WAVE LITHOTRIPSY (ESWL);  Surgeon: Erman Hayward, MD;  Location: [facility];  Service: Urology;  Laterality: Left;   LITHOTRIPSY  08/2021   POLYPECTOMY     ROTATOR CUFF REPAIR  07/26/2007   SEPTOPLASTY      Family History  Problem Relation Age of Onset   Alcohol abuse Mother    Diabetes Father    Breast cancer Paternal Grandmother    Lung cancer Paternal Grandfather    Heart disease Neg Hx     Allergies  Allergen Reactions   Bee Pollen Anaphylaxis   Bee Venom Anaphylaxis   Statins Other (See Comments)    Memory issues    Current Outpatient Medications on File Prior to Visit  Medication Sig Dispense Refill   albuterol  (VENTOLIN  HFA) 108 (90 Base) MCG/ACT inhaler USE 2 INHALATIONS EVERY 6 HOURS AS NEEDED 8 g 0   BD PEN NEEDLE NANO U/F 32G X 4 MM MISC USE TO INJECT INSULIN  100 each 11   blood glucose meter kit and supplies KIT Dispense based on patient and insurance preference. Use up to 3 times daily as directed. (Dx is E11.9). 1 each 0   buPROPion  (WELLBUTRIN  XL) 300 MG 24 hr tablet TAKE 1 TABLET DAILY FOR DEPRESSION 90 tablet 3   clotrimazole-betamethasone (LOTRISONE) cream Apply topically.     cyclobenzaprine  (FLEXERIL ) 10 MG tablet Take 1 tablet by mouth once to twice daily as needed for severe headaches. 30 tablet 0   DEXILANT  60 MG capsule Take 1 capsule (60 mg total)  by mouth daily. For heartburn. 90 capsule 0   Dulaglutide  (TRULICITY ) 4.5 MG/0.5ML SOAJ INJECT 4.5 MG ONCE WEEKLY AS DIRECTED FOR DIABETES 6 mL 1   fenofibrate  (TRICOR ) 145 MG tablet TAKE 1 TABLET DAILY FOR CHOLESTEROL 90 tablet 2   glucose blood (FREESTYLE LITE) test strip USE UP TO THREE TIMES A DAY AS DIRECTED 300 each 2   insulin  detemir (LEVEMIR  FLEXPEN) 100 UNIT/ML FlexPen Inject 30 Units into the skin daily. for diabetes. (Patient taking differently: Inject 35 Units into the skin daily. for diabetes.) 30 mL 1   Lancets (FREESTYLE) lancets USE TO TEST BLOOD SUGAR 2 TO 3 TIMES  A DAY AND AS DIRECTED 300 each 1   metFORMIN  (GLUCOPHAGE ) 1000 MG tablet TAKE 1 TABLET TWICE A DAY WITH MEALS FOR DIABETES 180 tablet 1   sildenafil (VIAGRA) 100 MG tablet      SUMAtriptan  (IMITREX ) 100 MG tablet TAKE 1 TABLET AT MIGRAINE ONSET, MAY REPEAT IN 2 HOURS IF HEADACHE PERISISTS OR RECURS 9 tablet 0   tadalafil  (CIALIS ) 20 MG tablet TAKE 1 TABLET 30 MINUTES PRIOR TO INTERCOURSE AS NEEDED 30 tablet 0   No current facility-administered medications on file prior to visit.    BP 132/74   Pulse (!) 56   Temp (!) 97.4 F (36.3 C) (Oral)   Ht 5\' 9"  (1.753 m)   Wt 230 lb (104.3 kg)   SpO2 98%   BMI 33.97 kg/m  Objective:   Physical Exam Cardiovascular:     Rate and Rhythm: Normal rate and regular rhythm.  Pulmonary:     Effort: Pulmonary effort is normal.     Breath sounds: Normal breath sounds.  Musculoskeletal:     Cervical back: Neck supple.  Skin:    General: Skin is warm and dry.  Neurological:     Mental Status: He is alert and oriented to person, place, and time.  Psychiatric:        Mood and Affect: Mood normal.           Assessment & Plan:  Type 2 diabetes mellitus with hyperglycemia, with long-term current use of insulin  (HCC) Assessment & Plan: Deteriorated with A1c of 7.4 today. Discussed to drink sugar-free sweet tea, limit carbs.  Continue Levemir  35 units daily for  now. Continue metformin  1000 mg twice daily. Continue Trulicity  4.5 mg weekly.  Foot exam today.  Follow-up in 6 months.  Orders: -     POCT glycosylated hemoglobin (Hb A1C)  Bee sting allergy -     EPINEPHrine ; Inject 0.3 mg into the muscle as needed for anaphylaxis.  Dispense: 2 each; Refill: 0  Chronic migraine without aura without status migrainosus, not intractable Assessment & Plan: Remains uncontrolled.  As he has failed amitriptyline  and now topiramate , we will obtain MRI brain and refer to neurology for further evaluation. Will also start propranolol ER 80 mg at bedtime. Wean off Topamax .  Continue sumatriptan  100 mg as needed.  He will update.   Orders: -     Ambulatory referral to Neurology -     MR BRAIN WO CONTRAST; Future -     Propranolol HCl ER; Take 1 capsule (80 mg total) by mouth at bedtime. For headache prevention  Dispense: 90 capsule; Refill: 0  Frequent headaches Assessment & Plan: Remains uncontrolled.  As he has failed amitriptyline  and now topiramate , we will obtain MRI brain and refer to neurology for further evaluation. Will also start propranolol ER 80 mg at bedtime. Continue sumatriptan  100 mg as needed.  He will update.   Orders: -     Ambulatory referral to Neurology -     MR BRAIN WO CONTRAST; Future -     Propranolol HCl ER; Take 1 capsule (80 mg total) by mouth at bedtime. For headache prevention  Dispense: 90 capsule; Refill: 0  Sciatica of left side Assessment & Plan: No alarm signs.  Discussed options including increased physical activity/walking and physical therapy. He kindly declines physical therapy today but will update if he changes his mind. He will increase physical activity.         Donita Newland K Drayden Lukas, NP

## 2023-11-28 NOTE — Patient Instructions (Addendum)
 Reduce topiramate  to 1/2 tablet daily x 1 week, then reduce to 25 mg (1/2 tablet of 50 mg dose) daily x 1 week then stop.  Start propranolol ER 80 mg at bedtime for headache prevention.  You will either be contacted via phone regarding your referral to neurology, or you may receive a letter on your MyChart portal from our referral team with instructions for scheduling an appointment. Please let us  know if you have not been contacted by anyone within two weeks.  You will receive a phone call regarding the MRI  Please schedule a physical to meet with me in 6 months.   It was a pleasure to see you today!

## 2023-11-28 NOTE — Assessment & Plan Note (Addendum)
 Remains uncontrolled.  As he has failed amitriptyline  and now topiramate , we will obtain MRI brain and refer to neurology for further evaluation. Will also start propranolol ER 80 mg at bedtime. Wean off Topamax .  Continue sumatriptan  100 mg as needed.  He will update.

## 2023-11-28 NOTE — Assessment & Plan Note (Signed)
 No alarm signs.  Discussed options including increased physical activity/walking and physical therapy. He kindly declines physical therapy today but will update if he changes his mind. He will increase physical activity.

## 2023-11-28 NOTE — Assessment & Plan Note (Signed)
 Deteriorated with A1c of 7.4 today. Discussed to drink sugar-free sweet tea, limit carbs.  Continue Levemir  35 units daily for now. Continue metformin  1000 mg twice daily. Continue Trulicity  4.5 mg weekly.  Foot exam today.  Follow-up in 6 months.

## 2023-11-28 NOTE — Assessment & Plan Note (Signed)
 Remains uncontrolled.  As he has failed amitriptyline  and now topiramate , we will obtain MRI brain and refer to neurology for further evaluation. Will also start propranolol ER 80 mg at bedtime. Continue sumatriptan  100 mg as needed.  He will update.

## 2023-11-30 ENCOUNTER — Encounter: Payer: Self-pay | Admitting: *Deleted

## 2023-12-08 ENCOUNTER — Telehealth: Payer: Self-pay

## 2023-12-08 ENCOUNTER — Ambulatory Visit
Admission: RE | Admit: 2023-12-08 | Discharge: 2023-12-08 | Disposition: A | Discharge status: Home / Self Care | Source: Ambulatory Visit | Attending: Primary Care | Admitting: Primary Care

## 2023-12-08 DIAGNOSIS — R519 Headache, unspecified: Secondary | ICD-10-CM | POA: Insufficient documentation

## 2023-12-08 DIAGNOSIS — G43709 Chronic migraine without aura, not intractable, without status migrainosus: Secondary | ICD-10-CM | POA: Insufficient documentation

## 2023-12-08 NOTE — Telephone Encounter (Signed)
 Please contact patient and let him know that MRI showed a very small round area over left mandible near the TMJ joint that could be a cyst versus a mass. Polly Brink will be back next week to be able to review this in detail with him. Kate's last note does not describe pain in this area so I am not sure it is connected with his headache pain.

## 2023-12-08 NOTE — Telephone Encounter (Signed)
 Called and spoke with patient, advised of Dr. Millie Alm message. Patient verbalized understanding and will await call next week to review in more detail.

## 2023-12-08 NOTE — Telephone Encounter (Signed)
 Received a call from [facility] imaging regarding results of MRI Brain for this patient.  Advised this be viewed by a provider as soon as possible.  Report is in chart she brought attention to # 3 under the Impression:  6 mm round T2 hyperintense lesion along the posteromedial aspect of the left temporomandibular joint. Differential considerations include a synovial cyst, small primary parotid neoplasm or nerve sheath tumor (such as a schwannoma). Consider post-contrast MR imaging for further characterization.  PCP out of office, routing to covering providers and PCP.

## 2023-12-11 NOTE — Telephone Encounter (Signed)
Noted, see result note  

## 2023-12-12 ENCOUNTER — Ambulatory Visit: Payer: Self-pay | Admitting: Primary Care

## 2023-12-12 DIAGNOSIS — G43709 Chronic migraine without aura, not intractable, without status migrainosus: Secondary | ICD-10-CM

## 2023-12-12 DIAGNOSIS — R9089 Other abnormal findings on diagnostic imaging of central nervous system: Secondary | ICD-10-CM

## 2023-12-25 NOTE — Telephone Encounter (Signed)
 Devon Jackson, should he just call the imaging center to schedule the MRI?

## 2023-12-27 ENCOUNTER — Other Ambulatory Visit: Payer: Self-pay | Admitting: Primary Care

## 2023-12-27 DIAGNOSIS — G43709 Chronic migraine without aura, not intractable, without status migrainosus: Secondary | ICD-10-CM

## 2024-01-02 MED ORDER — SUMATRIPTAN SUCCINATE 100 MG PO TABS: ORAL_TABLET | ORAL | 0 refills | Status: DC

## 2024-01-03 ENCOUNTER — Other Ambulatory Visit: Payer: Self-pay | Admitting: Primary Care

## 2024-01-03 DIAGNOSIS — E1165 Type 2 diabetes mellitus with hyperglycemia: Secondary | ICD-10-CM

## 2024-01-08 ENCOUNTER — Ambulatory Visit
Admission: RE | Admit: 2024-01-08 | Discharge: 2024-01-08 | Disposition: A | Discharge status: Home / Self Care | Source: Ambulatory Visit | Attending: Primary Care | Admitting: Primary Care

## 2024-01-08 DIAGNOSIS — R9089 Other abnormal findings on diagnostic imaging of central nervous system: Secondary | ICD-10-CM | POA: Insufficient documentation

## 2024-01-08 MED ORDER — GADOBUTROL 1 MMOL/ML IV SOLN
10.0000 mL | Freq: Once | INTRAVENOUS | Status: AC | PRN
  Administered 2024-01-08: 10 mL via INTRAVENOUS

## 2024-01-22 ENCOUNTER — Ambulatory Visit: Payer: Self-pay | Admitting: Primary Care

## 2024-01-29 ENCOUNTER — Other Ambulatory Visit: Payer: Self-pay | Admitting: Primary Care

## 2024-01-29 DIAGNOSIS — E1165 Type 2 diabetes mellitus with hyperglycemia: Secondary | ICD-10-CM

## 2024-02-19 ENCOUNTER — Other Ambulatory Visit: Payer: Self-pay | Admitting: Primary Care

## 2024-02-19 DIAGNOSIS — G43709 Chronic migraine without aura, not intractable, without status migrainosus: Secondary | ICD-10-CM

## 2024-02-19 DIAGNOSIS — R519 Headache, unspecified: Secondary | ICD-10-CM

## 2024-03-28 ENCOUNTER — Other Ambulatory Visit: Payer: Self-pay

## 2024-03-28 DIAGNOSIS — G43709 Chronic migraine without aura, not intractable, without status migrainosus: Secondary | ICD-10-CM

## 2024-03-28 MED ORDER — SUMATRIPTAN SUCCINATE 100 MG PO TABS: ORAL_TABLET | ORAL | 0 refills | Status: DC

## 2024-04-01 ENCOUNTER — Other Ambulatory Visit: Payer: Self-pay | Admitting: Primary Care

## 2024-04-01 DIAGNOSIS — E782 Mixed hyperlipidemia: Secondary | ICD-10-CM

## 2024-04-05 ENCOUNTER — Encounter: Payer: Self-pay | Admitting: Internal Medicine

## 2024-04-05 ENCOUNTER — Ambulatory Visit (INDEPENDENT_AMBULATORY_CARE_PROVIDER_SITE_OTHER): Admitting: Internal Medicine

## 2024-04-05 VITALS — BP 122/80 | HR 89 | Ht 69.0 in | Wt 230.8 lb

## 2024-04-05 DIAGNOSIS — E7849 Other hyperlipidemia: Secondary | ICD-10-CM

## 2024-04-05 DIAGNOSIS — E785 Hyperlipidemia, unspecified: Secondary | ICD-10-CM

## 2024-04-05 DIAGNOSIS — Z794 Long term (current) use of insulin: Secondary | ICD-10-CM

## 2024-04-05 DIAGNOSIS — Z7984 Long term (current) use of oral hypoglycemic drugs: Secondary | ICD-10-CM

## 2024-04-05 DIAGNOSIS — E1165 Type 2 diabetes mellitus with hyperglycemia: Secondary | ICD-10-CM

## 2024-04-05 DIAGNOSIS — E1159 Type 2 diabetes mellitus with other circulatory complications: Secondary | ICD-10-CM

## 2024-04-05 LAB — POCT GLYCOSYLATED HEMOGLOBIN (HGB A1C): Hemoglobin A1C: 6.9 % — AB (ref 4.0–5.6)

## 2024-04-05 MED ORDER — EMPAGLIFLOZIN 10 MG PO TABS: 10.0000 mg | ORAL_TABLET | Freq: Every day | ORAL | 3 refills | Status: AC

## 2024-04-05 MED ORDER — INSULIN GLARGINE 100 UNIT/ML SOLOSTAR PEN: 40.0000 [IU] | PEN_INJECTOR | Freq: Every day | SUBCUTANEOUS | 3 refills | Status: DC

## 2024-04-05 NOTE — Patient Instructions (Addendum)
 Please continue: - Metformin  1000 mg 2x a day, with meals - Trulicity  4.5 mg weekly  Change Levemir  to: - Lantus  40 units at bedtime  Add: - Jardiance  10 mg daily before b'fast  Please return in 3 months.  PATIENT INSTRUCTIONS FOR TYPE 2 DIABETES:  DIET AND EXERCISE Diet and exercise is an important part of diabetic treatment.  We recommended aerobic exercise in the form of brisk walking (working between 40-60% of maximal aerobic capacity, similar to brisk walking) for 150 minutes per week (such as 30 minutes five days per week) along with 3 times per week performing 'resistance' training (using various gauge rubber tubes with handles) 5-10 exercises involving the major muscle groups (upper body, lower body and core) performing 10-15 repetitions (or near fatigue) each exercise. Start at half the above goal but build slowly to reach the above goals. If limited by weight, joint pain, or disability, we recommend daily walking in a swimming pool with water up to waist to reduce pressure from joints while allow for adequate exercise.    BLOOD GLUCOSES Monitoring your blood glucoses is important for continued management of your diabetes. Please check your blood glucoses 2-4 times a day: fasting, before meals and at bedtime (you can rotate these measurements - e.g. one day check before the 3 meals, the next day check before 2 of the meals and before bedtime, etc.).   HYPOGLYCEMIA (low blood sugar) Hypoglycemia is usually a reaction to not eating, exercising, or taking too much insulin / other diabetes drugs.  Symptoms include tremors, sweating, hunger, confusion, headache, etc. Treat IMMEDIATELY with 15 grams of Carbs: 4 glucose tablets  cup regular juice/soda 2 tablespoons raisins 4 teaspoons sugar 1 tablespoon honey Recheck blood glucose in 15 mins and repeat above if still symptomatic/blood glucose <100.  RECOMMENDATIONS TO REDUCE YOUR RISK OF DIABETIC COMPLICATIONS: * Take your  prescribed MEDICATION(S) * Follow a DIABETIC diet: Complex carbs, fiber rich foods, (monounsaturated and polyunsaturated) fats * AVOID saturated/trans fats, high fat foods, >2,300 mg salt per day. * EXERCISE at least 5 times a week for 30 minutes or preferably daily.  * DO NOT SMOKE OR DRINK more than 1 drink a day. * Check your FEET every day. Do not wear tightfitting shoes. Contact us  if you develop an ulcer * See your EYE doctor once a year or more if needed * Get a FLU shot once a year * Get a PNEUMONIA vaccine once before and once after age 42 years  GOALS:  * Your Hemoglobin A1c of <7%  * fasting sugars need to be 80-130 * after meals sugars need to be <180 (2h after you start eating) * Your Systolic BP should be 130 or lower  * Your Diastolic BP should be 80 or lower  * Your HDL (Good Cholesterol) should be 40 or higher  * Your LDL (Bad Cholesterol) should be ideally <70. * Your Triglycerides should be 150 or lower  * Your Urine microalbumin (kidney function) should be <30 * Your Body Mass Index should be 25 or lower   Please consider the following ways to cut down carbs and fat and increase fiber and micronutrients in your diet: - substitute whole grain for white bread or pasta - substitute brown rice for white rice - substitute 90-calorie flat bread pieces for slices of bread when possible - substitute sweet potatoes or yams for white potatoes - substitute humus for margarine - substitute tofu for cheese when possible - substitute almond or rice milk  for regular milk (would not drink soy milk daily due to concern for soy estrogen influence on breast cancer risk) - substitute dark chocolate for other sweets when possible - substitute water - can add lemon or orange slices for taste - for diet sodas (artificial sweeteners will trick your body that you can eat sweets without getting calories and will lead you to overeating and weight gain in the long run) - do not skip breakfast  or other meals (this will slow down the metabolism and will result in more weight gain over time)  - can try smoothies made from fruit and almond/rice milk in am instead of regular breakfast - can also try old-fashioned (not instant) oatmeal made with almond/rice milk in am - order the dressing on the side when eating salad at a restaurant (pour less than half of the dressing on the salad) - eat as little meat as possible - can try juicing, but should not forget that juicing will get rid of the fiber, so would alternate with eating raw veg./fruits or drinking smoothies - use as little oil as possible, even when using olive oil - can dress a salad with a mix of balsamic vinegar and lemon juice, for e.g. - use agave nectar, stevia sugar, or regular sugar rather than artificial sweateners - steam or broil/roast veggies  - snack on veggies/fruit/nuts (unsalted, preferably) when possible, rather than processed foods - reduce or eliminate aspartame in diet (it is in diet sodas, chewing gum, etc) Read the labels!  Try to read Dr. Rosalynn Points book: Program for Reversing Diabetes for other ideas for healthy eating.

## 2024-04-05 NOTE — Addendum Note (Signed)
 Addended by: CLEOTILDE ROLIN RAMAN on: 04/05/2024 03:10 PM   Modules accepted: Orders

## 2024-04-05 NOTE — Progress Notes (Signed)
 Patient ID: Devon Jackson, male   DOB: [birth year], 58 y.o.   MRN: [id number]  HPI: Devon Jackson is a 58 y.o.-year-old male, referred by his PCP, Dr. Gretta, for management of DM2, dx in 2010, insulin -dependent since 2022, uncontrolled, with complications (elevated calcium  score, aortic root dilation, ED). He is here with his wife, Devon Jackson, who is also my patient.  She offers part of the history including diet, activity, and medications.  Reviewed HbA1c: Lab Results  Component Value Date   HGBA1C 7.4 (A) 11/28/2023   HGBA1C 6.6 (A) 09/06/2023   HGBA1C 6.5 05/30/2023   HGBA1C 6.2 (A) 01/10/2023   HGBA1C 6.1 (A) 10/03/2022   HGBA1C 7.0 (H) 05/24/2022   HGBA1C 7.2 (A) 02/11/2022   HGBA1C 7.2 (A) 11/12/2021   HGBA1C 7.6 (H) 05/20/2021   HGBA1C 7.3 (A) 11/17/2020   Pt is on a regimen of: - Metformin  1000 mg 2x a day, with meals - Levemir  50-60 units >> ...30 >> 35 >> 40 units at bedtime (tried to reduce the dose when starting Ozempic, but she had to increase it afterwards) - Trulicity  4.5 mg weekly - started 2023 He tried Bydureon >> did not work.  Pt checks his sugars 4x a day and they are:  Lowest sugar was 89; he has hypoglycemia awareness at 70.  Highest sugar was 289.  Glucometer: Freestyle lite  Pt's meals are: - Breakfast: fast food - 2 eggs with bacon - Lunch: chicken strips + green beans - Dinner: meat + barley; meat + beans, cornbread - Snacks:2 - chips, popcorn He is trying to decrease portion sizes, but still eats lunch out. He lost 265 >> 210 lbs on [employer].  - no CKD, last BUN/creatinine:  Lab Results  Component Value Date   BUN 12 05/30/2023   BUN 18 05/24/2022   CREATININE 1.11 05/30/2023   CREATININE 1.02 05/24/2022   No results found for: MICRALBCREAT  - + HL; last set of lipids: Lab Results  Component Value Date   CHOL 161 05/30/2023   HDL 35.20 (L) 05/30/2023   LDLCALC 106 (H) 05/30/2023   LDLDIRECT 114.0 11/17/2020   TRIG 99.0  05/30/2023   CHOLHDL 5 05/30/2023  He is on fenofibrate  145 mg daily.  He is statin intolerant (muscle aches - could barely walk; memory problems). She had an elevated calcium  score of 26.6 on 08/02/2023.  He sees cardiology.  He has aortic root dilation, frequent PVCs.  - last eye exam was in 04/13/2023. No DR.   - no numbness and tingling in his feet.  Last foot exam 11/28/2023.  Pt has no FH of DM.  He also has a history of OSA, GERD, migraines, MDD, insomnia. Cholecystectomy 2-3 years ago >> diarrhea.   ROS: Constitutional: no weight gain, no weight loss, + fatigue, + subjective hyperthermia, no subjective hypothermia, no nocturia Eyes: + Blurry vision, no xerophthalmia ENT: no dysphagia, no odynophagia, no hoarseness, + tinnitus, + hypoacusis Cardiovascular: no CP, + SOB, no palpitations, no leg swelling Respiratory: + cough, + SOB, + wheezing Gastrointestinal: + Acid reflux, + diarrhea, + nausea Musculoskeletal: no muscle, + joint aches Skin: no rash, no hair loss Neurological: no tremors, no numbness or tingling/no dizziness/+ headaches GU: + Difficulty with erections  Past Medical History:  Diagnosis Date   Allergic rhinitis    Frequent headaches    Gout    Hypertriglyceridemia    Sleep apnea    Type 2 diabetes mellitus (HCC)    Past Surgical  History:  Procedure Laterality Date   APPENDECTOMY     EXTRACORPOREAL SHOCK WAVE LITHOTRIPSY Left 09/02/2021   Procedure: LEFT EXTRACORPOREAL SHOCK WAVE LITHOTRIPSY (ESWL);  Surgeon: Gaston Hamilton, MD;  Location: [facility];  Service: Urology;  Laterality: Left;   LITHOTRIPSY  08/2021   POLYPECTOMY     ROTATOR CUFF REPAIR  07/26/2007   SEPTOPLASTY     Social History   Socioeconomic History   Marital status: Married    Spouse name: Not on file   Number of children: Not on file   Years of education: Not on file   Highest education level: Not on file  Occupational History   Not on file  Tobacco Use    Smoking status: Never   Smokeless tobacco: Never  Vaping Use   Vaping status: Never Used  Substance and Sexual Activity   Alcohol use: Not Currently    Comment: rarely   Drug use: No   Sexual activity: Yes    Birth control/protection: Condom  Other Topics Concern   Not on file  Social History Narrative   Married.   3 children.   Works as a Financial planner.   Enjoys relaxing, spending time outdoors.   Social Drivers of Corporate investment banker Strain: Low Risk  (09/04/2023)   Overall Financial Resource Strain (CARDIA)    Difficulty of Paying Living Expenses: Not hard at all  Food Insecurity: Patient Declined (09/04/2023)   Hunger Vital Sign    Worried About Running Out of Food in the Last Year: Patient declined    Ran Out of Food in the Last Year: Patient declined  Transportation Needs: Patient Declined (09/04/2023)   PRAPARE - Administrator, Civil Service (Medical): Patient declined    Lack of Transportation (Non-Medical): Patient declined  Physical Activity: Unknown (09/04/2023)   Exercise Vital Sign    Days of Exercise per Week: 0 days    Minutes of Exercise per Session: Not on file  Stress: No Stress Concern Present (09/04/2023)   [employer] of Occupational Health - Occupational Stress Questionnaire    Feeling of Stress : Not at all  Social Connections: Unknown (09/04/2023)   Social Connection and Isolation Panel    Frequency of Communication with Friends and Family: More than three times a week    Frequency of Social Gatherings with Friends and Family: Twice a week    Attends Religious Services: Patient declined    Database administrator or Organizations: Patient declined    Attends Engineer, structural: Not on file    Marital Status: Married  Catering manager Violence: Not on file   Current Outpatient Medications on File Prior to Visit  Medication Sig Dispense Refill   albuterol  (VENTOLIN  HFA) 108 (90 Base) MCG/ACT inhaler USE 2  INHALATIONS EVERY 6 HOURS AS NEEDED 8 g 0   BD PEN NEEDLE NANO U/F 32G X 4 MM MISC USE TO INJECT INSULIN  100 each 11   blood glucose meter kit and supplies KIT Dispense based on patient and insurance preference. Use up to 3 times daily as directed. (Dx is E11.9). 1 each 0   buPROPion  (WELLBUTRIN  XL) 300 MG 24 hr tablet TAKE 1 TABLET DAILY FOR DEPRESSION 90 tablet 3   clotrimazole-betamethasone (LOTRISONE) cream Apply topically.     cyclobenzaprine  (FLEXERIL ) 10 MG tablet Take 1 tablet by mouth once to twice daily as needed for severe headaches. 30 tablet 0   DEXILANT  60 MG capsule Take 1  capsule (60 mg total) by mouth daily. For heartburn. 90 capsule 0   Dulaglutide  (TRULICITY ) 4.5 MG/0.5ML SOAJ INJECT 4.5 MG ONCE WEEKLY AS DIRECTED FOR DIABETES 6 mL 1   EPINEPHrine  0.3 mg/0.3 mL IJ SOAJ injection Inject 0.3 mg into the muscle as needed for anaphylaxis. 2 each 0   fenofibrate  (TRICOR ) 145 MG tablet TAKE 1 TABLET DAILY FOR CHOLESTEROL 90 tablet 0   glucose blood (FREESTYLE LITE) test strip USE UP TO THREE TIMES A DAY AS DIRECTED 300 each 2   insulin  detemir (LEVEMIR  FLEXPEN) 100 UNIT/ML FlexPen Inject 30 Units into the skin daily. for diabetes. (Patient taking differently: Inject 35 Units into the skin daily. for diabetes.) 30 mL 1   Lancets (FREESTYLE) lancets USE TO TEST BLOOD SUGAR 2 TO 3 TIMES A DAY AND AS DIRECTED 300 each 1   metFORMIN  (GLUCOPHAGE ) 1000 MG tablet TAKE 1 TABLET TWICE A DAY WITH MEALS FOR DIABETES 180 tablet 1   propranolol  ER (INDERAL  LA) 80 MG 24 hr capsule TAKE 1 CAPSULE AT BEDTIME FOR HEADACHE PREVENTION 90 capsule 0   sildenafil (VIAGRA) 100 MG tablet      SUMAtriptan  (IMITREX ) 100 MG tablet TAKE 1 TABLET AT MIGRAINE ONSET, MAY REPEAT IN 2 HOURS IF HEADACHE PERISISTS OR RECURS 9 tablet 0   tadalafil  (CIALIS ) 20 MG tablet TAKE 1 TABLET 30 MINUTES PRIOR TO INTERCOURSE AS NEEDED 30 tablet 0   No current facility-administered medications on file prior to visit.   Allergies   Allergen Reactions   Bee Pollen Anaphylaxis   Bee Venom Anaphylaxis   Statins Other (See Comments)    Memory issues   Family History  Problem Relation Age of Onset   Alcohol abuse Mother    Diabetes Father    Breast cancer Paternal Grandmother    Lung cancer Paternal Grandfather    Heart disease Neg Hx    PE: BP 122/80   Pulse 89   Ht 5' 9 (1.753 m)   Wt 230 lb 12.8 oz (104.7 kg)   SpO2 96%   BMI 34.08 kg/m  Wt Readings from Last 3 Encounters:  11/28/23 230 lb (104.3 kg)  11/16/23 230 lb (104.3 kg)  09/08/23 231 lb 12.8 oz (105.1 kg)   Constitutional: overweight, in NAD Eyes:  EOMI, no exophthalmos ENT: no neck masses, no cervical lymphadenopathy Cardiovascular: RRR, No MRG Respiratory: CTA B Musculoskeletal: no deformities Skin:no rashes Neurological: no tremor with outstretched hands  ASSESSMENT: 1. DM2, non-insulin -dependent, uncontrolled, without long-term complications  2. HL  PLAN:  1. Patient with long-standing, uncontrolled diabetes, on oral antidiabetic regimen with metformin , and also long-acting basal insulin  and weekly GLP-1/receptor agonist, which became insufficient.  His most recent HbA1c from 11/2023 increased to 7.4%, from 6.6% previously. CGM interpretation: -At today's visit, we reviewed his CGM downloads: It appears that 77% of values are in target range (goal >70%), while 23% are higher than 180 (goal <25%), and 0% are lower than 70 (goal <4%).  The calculated average blood sugar is 157.  The projected HbA1c for the next 3 months (GMI) is 7.1%. -Reviewing the CGM trends, sugars appear to be fluctuating in the upper half of the target range, with hyperglycemic excursions after lunch and dinner.  Upon questioning, he is having a poor diet, eating fast food for breakfast and lunch and snacking on chips and popcorn.  We discussed about improving diet and I made several suggestions.  I also recommended a referral to nutrition, but patient declined for  now. -We discussed about options for treatment to include adding an SGLT2 inhibitor or switching from Trulicity  to a stronger GLP-1 or GIP receptor agonist.  For now, we decided to add Jardiance  and continue with Trulicity .  At next visit, we may need to switch to Mounjaro.  Will also need to switch from Levemir  to Lantus , since Levemir  is not manufactured anymore.  For now we will continue metformin . He does have diarrhea, but not very bothersome. He mentions this started after his cholecystectomy 2 to 3 years ago.  I recommended a probiotic. - I suggested to:  Patient Instructions  Please continue: - Metformin  1000 mg 2x a day, with meals - Trulicity  4.5 mg weekly  Change Levemir  to: - Lantus  40 units at bedtime  Add: - Jardiance  10 mg daily before b'fast  Please return in 3 months.  - continue to check sugars at different times of the day - check 4x a day, rotating - discussed about CBG targets for treatment: 80-130 mg/dL before meals and <819 mg/dL after meals; target YaJ8r <7%. - advised for yearly eye exams.  He is up-to-date. - will check an ACR today - Return to clinic in 3 mo   2. HL - Reviewed latest lipid panel  -LDL above target and HDL slightly low: Lab Results  Component Value Date   CHOL 161 05/30/2023   HDL 35.20 (L) 05/30/2023   LDLCALC 106 (H) 05/30/2023   LDLDIRECT 114.0 11/17/2020   TRIG 99.0 05/30/2023   CHOLHDL 5 05/30/2023  - Continues fenofibrate  145 mg daily without side effects.  He had memory problems on statins in the past. We discussed that we have other options to lower LDL including ezetimibe , bempedoic acid, PCSK9 inhibitors, inclisiran.  Will discuss at next visit.  He has an upcoming appointment with PCP soon and will have another Lipid  check at that time.   Orders Placed This Encounter  Procedures   Microalbumin / creatinine urine ratio   Lela Fendt, MD PhD [facility]

## 2024-04-06 ENCOUNTER — Ambulatory Visit: Payer: Self-pay | Admitting: Internal Medicine

## 2024-04-06 LAB — MICROALBUMIN / CREATININE URINE RATIO
Creatinine, Urine: 69 mg/dL (ref 20–320)
Microalb Creat Ratio: 9 mg/g{creat} (ref <30)
Microalb, Ur: 0.6 mg/dL

## 2024-04-23 ENCOUNTER — Telehealth: Payer: Self-pay | Admitting: Cardiology

## 2024-04-23 NOTE — Telephone Encounter (Signed)
 Called and spoke with patient. Patient states that he received a voice mail asking him to call back to schedule his echocardiogram. Will forward to scheduling.

## 2024-04-23 NOTE — Telephone Encounter (Signed)
 Patient stated he is returning a phone call about scheduling an appointment. I dont see any notes in regard to this. Patient wants to make sure we are not needing anything from him at this time. Please advise.

## 2024-05-02 ENCOUNTER — Other Ambulatory Visit: Payer: Self-pay | Admitting: Primary Care

## 2024-05-02 DIAGNOSIS — G43709 Chronic migraine without aura, not intractable, without status migrainosus: Secondary | ICD-10-CM

## 2024-05-02 DIAGNOSIS — R519 Headache, unspecified: Secondary | ICD-10-CM

## 2024-05-07 ENCOUNTER — Other Ambulatory Visit: Payer: Self-pay | Admitting: Primary Care

## 2024-05-07 DIAGNOSIS — F3342 Major depressive disorder, recurrent, in full remission: Secondary | ICD-10-CM

## 2024-05-31 ENCOUNTER — Ambulatory Visit: Admitting: Primary Care

## 2024-05-31 ENCOUNTER — Ambulatory Visit: Payer: Self-pay | Admitting: Primary Care

## 2024-05-31 ENCOUNTER — Other Ambulatory Visit: Payer: Self-pay | Admitting: *Deleted

## 2024-05-31 ENCOUNTER — Encounter: Payer: Self-pay | Admitting: Primary Care

## 2024-05-31 VITALS — BP 152/74 | HR 75 | Temp 97.9°F | Ht 68.5 in | Wt 220.2 lb

## 2024-05-31 DIAGNOSIS — G4733 Obstructive sleep apnea (adult) (pediatric): Secondary | ICD-10-CM | POA: Diagnosis not present

## 2024-05-31 DIAGNOSIS — E1165 Type 2 diabetes mellitus with hyperglycemia: Secondary | ICD-10-CM

## 2024-05-31 DIAGNOSIS — Z1211 Encounter for screening for malignant neoplasm of colon: Secondary | ICD-10-CM

## 2024-05-31 DIAGNOSIS — R519 Headache, unspecified: Secondary | ICD-10-CM

## 2024-05-31 DIAGNOSIS — F3342 Major depressive disorder, recurrent, in full remission: Secondary | ICD-10-CM

## 2024-05-31 DIAGNOSIS — R030 Elevated blood-pressure reading, without diagnosis of hypertension: Secondary | ICD-10-CM

## 2024-05-31 DIAGNOSIS — Z125 Encounter for screening for malignant neoplasm of prostate: Secondary | ICD-10-CM | POA: Diagnosis not present

## 2024-05-31 DIAGNOSIS — G43709 Chronic migraine without aura, not intractable, without status migrainosus: Secondary | ICD-10-CM | POA: Diagnosis not present

## 2024-05-31 DIAGNOSIS — E782 Mixed hyperlipidemia: Secondary | ICD-10-CM | POA: Diagnosis not present

## 2024-05-31 DIAGNOSIS — Z23 Encounter for immunization: Secondary | ICD-10-CM | POA: Diagnosis not present

## 2024-05-31 DIAGNOSIS — Z0000 Encounter for general adult medical examination without abnormal findings: Secondary | ICD-10-CM | POA: Diagnosis not present

## 2024-05-31 DIAGNOSIS — N529 Male erectile dysfunction, unspecified: Secondary | ICD-10-CM

## 2024-05-31 DIAGNOSIS — Z794 Long term (current) use of insulin: Secondary | ICD-10-CM

## 2024-05-31 DIAGNOSIS — K219 Gastro-esophageal reflux disease without esophagitis: Secondary | ICD-10-CM

## 2024-05-31 LAB — LIPID PANEL
Cholesterol: 171 mg/dL (ref 0–200)
HDL: 32 mg/dL — ABNORMAL LOW (ref >39.00)
LDL Cholesterol: 95 mg/dL (ref 0–99)
NonHDL: 138.82
Total CHOL/HDL Ratio: 5
Triglycerides: 217 mg/dL — ABNORMAL HIGH (ref 0.0–149.0)
VLDL: 43.4 mg/dL — ABNORMAL HIGH (ref 0.0–40.0)

## 2024-05-31 LAB — COMPREHENSIVE METABOLIC PANEL WITH GFR
ALT: 14 U/L (ref 0–53)
AST: 11 U/L (ref 0–37)
Albumin: 4.6 g/dL (ref 3.5–5.2)
Alkaline Phosphatase: 44 U/L (ref 39–117)
BUN: 19 mg/dL (ref 6–23)
CO2: 26 meq/L (ref 19–32)
Calcium: 9.1 mg/dL (ref 8.4–10.5)
Chloride: 105 meq/L (ref 96–112)
Creatinine, Ser: 1.02 mg/dL (ref 0.40–1.50)
GFR: 80.5 mL/min (ref >60.00)
Glucose, Bld: 112 mg/dL — ABNORMAL HIGH (ref 70–99)
Potassium: 4.3 meq/L (ref 3.5–5.1)
Sodium: 141 meq/L (ref 135–145)
Total Bilirubin: 0.6 mg/dL (ref 0.2–1.2)
Total Protein: 6.5 g/dL (ref 6.0–8.3)

## 2024-05-31 LAB — PSA: PSA: 1.19 ng/mL (ref 0.10–4.00)

## 2024-05-31 MED ORDER — SUMATRIPTAN SUCCINATE 100 MG PO TABS: ORAL_TABLET | ORAL | 0 refills | Status: DC

## 2024-05-31 NOTE — Patient Instructions (Addendum)
 Stop by the lab prior to leaving today. I will notify you of your results once received.   You will either be contacted via phone regarding your referral to GI, or you may receive a letter on your MyChart portal from our referral team with instructions for scheduling an appointment. Please let us  know if you have not been contacted by anyone within two weeks.  Start monitoring your blood pressure daily, around the same time of day, for the next 2-3 weeks.  Ensure that you have rested for 30 minutes prior to checking your blood pressure.   Record your readings and notify me if you see numbers consistently at or above 130 on top and/or 90 on bottom.   It was a pleasure to see you today!

## 2024-05-31 NOTE — Progress Notes (Signed)
 Subjective:    Patient ID: Devon Jackson, male    DOB: 1965/08/11, 58 y.o.   MRN: [id number]  Devon Jackson is a very pleasant 58 y.o. male who presents today for complete physical and follow up of chronic conditions.  Immunizations: -Tetanus: Completed in 2018 -Influenza: Influenza vaccine provided today.  -Shingles: Completed Shingrix  series -Pneumonia: Completed in 2017  Diet: Fair diet.  Exercise: No regular exercise.  Eye exam: Completes annually  Dental exam: Completes semi-annually    Colonoscopy: Completed in 2020, due 2025. He is aware.   PSA: Due   BP Readings from Last 3 Encounters:  05/31/24 (!) 152/74  04/05/24 122/80  11/28/23 132/74        Review of Systems  Constitutional:  Negative for unexpected weight change.  HENT:  Negative for rhinorrhea.   Respiratory:  Negative for cough and shortness of breath.   Cardiovascular:  Negative for chest pain.  Gastrointestinal:  Positive for diarrhea and nausea. Negative for constipation.  Genitourinary:  Negative for difficulty urinating.  Musculoskeletal:  Negative for arthralgias and myalgias.  Skin:  Negative for rash.  Allergic/Immunologic: Negative for environmental allergies.  Neurological:  Negative for dizziness and headaches.  Psychiatric/Behavioral:  The patient is not nervous/anxious.          Past Medical History:  Diagnosis Date   Allergic rhinitis    Allergy    Statins and bee stings   Frequent headaches    Gout    Hypertriglyceridemia    Sleep apnea    Cpap   Type 2 diabetes mellitus (HCC)     Social History   Socioeconomic History   Marital status: Married    Spouse name: Not on file   Number of children: Not on file   Years of education: Not on file   Highest education level: 12th grade  Occupational History   Not on file  Tobacco Use   Smoking status: Never   Smokeless tobacco: Never  Vaping Use   Vaping status: Never Used  Substance and Sexual Activity    Alcohol use: Not Currently    Comment: rarely   Drug use: No   Sexual activity: Yes    Birth control/protection: Condom  Other Topics Concern   Not on file  Social History Narrative   Married.   3 children.   Works as a financial planner.   Enjoys relaxing, spending time outdoors.   Social Drivers of Corporate Investment Banker Strain: Low Risk  (05/27/2024)   Overall Financial Resource Strain (CARDIA)    Difficulty of Paying Living Expenses: Not hard at all  Food Insecurity: No Food Insecurity (05/27/2024)   Hunger Vital Sign    Worried About Running Out of Food in the Last Year: Never true    Ran Out of Food in the Last Year: Never true  Transportation Needs: No Transportation Needs (05/27/2024)   PRAPARE - Administrator, Civil Service (Medical): No    Lack of Transportation (Non-Medical): No  Physical Activity: Insufficiently Active (05/27/2024)   Exercise Vital Sign    Days of Exercise per Week: 1 day    Minutes of Exercise per Session: 30 min  Stress: No Stress Concern Present (05/27/2024)   [employer] of Occupational Health - Occupational Stress Questionnaire    Feeling of Stress: Not at all  Social Connections: Socially Integrated (05/27/2024)   Social Connection and Isolation Panel    Frequency of Communication with Friends and Family: More  than three times a week    Frequency of Social Gatherings with Friends and Family: Once a week    Attends Religious Services: More than 4 times per year    Active Member of [employer] or Organizations: Yes    Attends Engineer, Structural: More than 4 times per year    Marital Status: Married  Catering Manager Violence: Not on file    Past Surgical History:  Procedure Laterality Date   APPENDECTOMY     CHOLECYSTECTOMY     EXTRACORPOREAL SHOCK WAVE LITHOTRIPSY Left 09/02/2021   Procedure: LEFT EXTRACORPOREAL SHOCK WAVE LITHOTRIPSY (ESWL);  Surgeon: Gaston Hamilton, MD;  Location: [facility];   Service: Urology;  Laterality: Left;   LITHOTRIPSY  08/2021   POLYPECTOMY     ROTATOR CUFF REPAIR  07/26/2007   SEPTOPLASTY      Family History  Problem Relation Age of Onset   Alcohol abuse Mother    Diabetes Father    Breast cancer Paternal Grandmother    Lung cancer Paternal Grandfather    Cancer Paternal Grandfather    Heart disease Neg Hx     Allergies  Allergen Reactions   Bee Pollen Anaphylaxis   Bee Venom Anaphylaxis   Statins Other (See Comments)    Memory issues    Current Outpatient Medications on File Prior to Visit  Medication Sig Dispense Refill   albuterol  (VENTOLIN  HFA) 108 (90 Base) MCG/ACT inhaler USE 2 INHALATIONS EVERY 6 HOURS AS NEEDED 8 g 0   BD PEN NEEDLE NANO U/F 32G X 4 MM MISC USE TO INJECT INSULIN  100 each 11   blood glucose meter kit and supplies KIT Dispense based on patient and insurance preference. Use up to 3 times daily as directed. (Dx is E11.9). 1 each 0   buPROPion  (WELLBUTRIN  XL) 300 MG 24 hr tablet TAKE 1 TABLET DAILY FOR DEPRESSION 90 tablet 0   clotrimazole-betamethasone (LOTRISONE) cream Apply topically.     cyclobenzaprine  (FLEXERIL ) 10 MG tablet Take 1 tablet by mouth once to twice daily as needed for severe headaches. 30 tablet 0   DEXILANT  60 MG capsule Take 1 capsule (60 mg total) by mouth daily. For heartburn. 90 capsule 0   Dulaglutide  (TRULICITY ) 4.5 MG/0.5ML SOAJ INJECT 4.5 MG ONCE WEEKLY AS DIRECTED FOR DIABETES 6 mL 1   empagliflozin  (JARDIANCE ) 10 MG TABS tablet Take 1 tablet (10 mg total) by mouth daily before breakfast. 90 tablet 3   EPINEPHrine  0.3 mg/0.3 mL IJ SOAJ injection Inject 0.3 mg into the muscle as needed for anaphylaxis. 2 each 0   fenofibrate  (TRICOR ) 145 MG tablet TAKE 1 TABLET DAILY FOR CHOLESTEROL 90 tablet 0   glucose blood (FREESTYLE LITE) test strip USE UP TO THREE TIMES A DAY AS DIRECTED 300 each 2   insulin  glargine (LANTUS ) 100 UNIT/ML Solostar Pen Inject 40 Units into the skin at bedtime. 30 mL 3    Lancets (FREESTYLE) lancets USE TO TEST BLOOD SUGAR 2 TO 3 TIMES A DAY AND AS DIRECTED 300 each 1   metFORMIN  (GLUCOPHAGE ) 1000 MG tablet TAKE 1 TABLET TWICE A DAY WITH MEALS FOR DIABETES 180 tablet 1   propranolol  ER (INDERAL  LA) 80 MG 24 hr capsule TAKE 1 CAPSULE AT BEDTIME FOR HEADACHE PREVENTION 90 capsule 0   sildenafil (VIAGRA) 100 MG tablet      SUMAtriptan  (IMITREX ) 100 MG tablet TAKE 1 TABLET AT MIGRAINE ONSET, MAY REPEAT IN 2 HOURS IF HEADACHE PERISISTS OR RECURS 9 tablet 0  tadalafil  (CIALIS ) 20 MG tablet TAKE 1 TABLET 30 MINUTES PRIOR TO INTERCOURSE AS NEEDED 30 tablet 0   No current facility-administered medications on file prior to visit.    BP (!) 152/74   Pulse 75   Temp 97.9 F (36.6 C) (Oral)   Ht 5' 8.5 (1.74 m)   Wt 220 lb 4 oz (99.9 kg)   SpO2 94%   BMI 33.00 kg/m  Objective:   Physical Exam HENT:     Right Ear: Tympanic membrane and ear canal normal.     Left Ear: Tympanic membrane and ear canal normal.  Eyes:     Pupils: Pupils are equal, round, and reactive to light.  Cardiovascular:     Rate and Rhythm: Normal rate and regular rhythm.  Pulmonary:     Effort: Pulmonary effort is normal.     Breath sounds: Normal breath sounds.  Abdominal:     General: Bowel sounds are normal.     Palpations: Abdomen is soft.     Tenderness: There is no abdominal tenderness.  Musculoskeletal:        General: Normal range of motion.     Cervical back: Neck supple.  Skin:    General: Skin is warm and dry.  Neurological:     Mental Status: He is alert and oriented to person, place, and time.     Cranial Nerves: No cranial nerve deficit.     Deep Tendon Reflexes:     Reflex Scores:      Patellar reflexes are 2+ on the right side and 2+ on the left side. Psychiatric:        Mood and Affect: Mood normal.     Physical Exam        Assessment & Plan:  Need for influenza vaccination -     Flu vaccine trivalent PF, 6mos and  older(Flulaval,Afluria,Fluarix,Fluzone)  Chronic migraine without aura without status migrainosus, not intractable Assessment & Plan: Controlled!  Continue propranolol  ER 80 mg daily. Continue sumatriptan  100 mg PRN.   OSA (obstructive sleep apnea) Assessment & Plan: Continue CPAP nightly   Gastroesophageal reflux disease, unspecified whether esophagitis present Assessment & Plan: Controlled.  Continue Dexilant  60 mg PRN.   Type 2 diabetes mellitus with hyperglycemia, with long-term current use of insulin  [facility]) Assessment & Plan: Following with endocrinology, office notes and labs reviewed from September 2025.  Continue Lantus  25 units daily, Jardiance  10 mg daily, metformin  1000 mg BID, Trulicity  4.5 mg weekly.   Screening for colon cancer -     Ambulatory referral to Gastroenterology  Screening for prostate cancer -     PSA  Mixed hyperlipidemia Assessment & Plan: Repeat lipid panel pending.   Continue fenofibrate  145 mg daily.  Orders: -     Lipid panel -     Comprehensive metabolic panel with GFR  Recurrent major depressive disorder, in full remission Assessment & Plan: Controlled.  Continue bupropion  XL 300 mg daily   Frequent headaches Assessment & Plan: Significant improvement!  Continue propranolol  ER 80 mg daily for prevention and sumatriptan  100 mg as needed   Erectile dysfunction, unspecified erectile dysfunction type Assessment & Plan: Stable.  Continue tadalafil  20 mg PRN   Preventative health care Assessment & Plan: Immunizations UTD. Influenza vaccine provided today.  Colonoscopy due, referral placed to GI PSA due and pending.  Discussed the importance of a healthy diet and regular exercise in order for weight loss, and to reduce the risk of further co-morbidity.  Exam stable.  Labs pending.  Follow up in 1 year for repeat physical. .   Elevated blood pressure reading in office without diagnosis of  hypertension Assessment & Plan: Above goal today, also on recheck. This is not typical for him so I asked that he start monitoring his blood pressure at home and report if readings are consistently at or below 130/90.     Assessment and Plan Assessment & Plan         Comer MARLA Gaskins, NP    History of Present Illness

## 2024-05-31 NOTE — Assessment & Plan Note (Signed)
Continue CPAP nightly. °

## 2024-05-31 NOTE — Assessment & Plan Note (Signed)
 Significant improvement!  Continue propranolol  ER 80 mg daily for prevention and sumatriptan  100 mg as needed

## 2024-05-31 NOTE — Assessment & Plan Note (Signed)
 Following with endocrinology, office notes and labs reviewed from September 2025.  Continue Lantus  25 units daily, Jardiance  10 mg daily, metformin  1000 mg BID, Trulicity  4.5 mg weekly.

## 2024-05-31 NOTE — Assessment & Plan Note (Signed)
 Controlled.  Continue Dexilant  60 mg PRN.

## 2024-05-31 NOTE — Assessment & Plan Note (Signed)
 Above goal today, also on recheck. This is not typical for him so I asked that he start monitoring his blood pressure at home and report if readings are consistently at or below 130/90.

## 2024-05-31 NOTE — Assessment & Plan Note (Signed)
 Controlled!  Continue propranolol  ER 80 mg daily. Continue sumatriptan  100 mg PRN.

## 2024-05-31 NOTE — Assessment & Plan Note (Signed)
Immunizations UTD. Influenza vaccine provided today.  Colonoscopy due, referral placed to GI PSA due and pending.  Discussed the importance of a healthy diet and regular exercise in order for weight loss, and to reduce the risk of further co-morbidity.  Exam stable. Labs pending.  Follow up in 1 year for repeat physical.

## 2024-05-31 NOTE — Assessment & Plan Note (Signed)
 Stable. Continue tadalafil 20 mg PRN.

## 2024-05-31 NOTE — Assessment & Plan Note (Signed)
Repeat lipid panel pending.  Continue fenofibrate 145 mg daily

## 2024-05-31 NOTE — Assessment & Plan Note (Signed)
Controlled.  Continue bupropion XL 300 mg daily.

## 2024-06-05 ENCOUNTER — Telehealth: Payer: Self-pay | Admitting: Cardiology

## 2024-06-05 ENCOUNTER — Ambulatory Visit

## 2024-06-05 NOTE — Telephone Encounter (Signed)
 Patient was scheduled for an echocardiogram this morning, and upon check in patient was unsure if he should be scheduled this early. Patient states he had an echo in 09/2023. Please advise if patient needs repeat echo earlier than 1 year . Patient scheduled echo for 09/2024

## 2024-06-19 ENCOUNTER — Other Ambulatory Visit: Payer: Self-pay | Admitting: Primary Care

## 2024-06-19 DIAGNOSIS — E1165 Type 2 diabetes mellitus with hyperglycemia: Secondary | ICD-10-CM

## 2024-07-01 ENCOUNTER — Other Ambulatory Visit: Payer: Self-pay | Admitting: Primary Care

## 2024-07-01 DIAGNOSIS — E782 Mixed hyperlipidemia: Secondary | ICD-10-CM

## 2024-07-11 ENCOUNTER — Encounter: Payer: Self-pay | Admitting: Internal Medicine

## 2024-07-11 ENCOUNTER — Ambulatory Visit: Admitting: Internal Medicine

## 2024-07-11 VITALS — BP 120/60 | HR 56 | Ht 68.5 in | Wt 226.2 lb

## 2024-07-11 DIAGNOSIS — E7849 Other hyperlipidemia: Secondary | ICD-10-CM

## 2024-07-11 DIAGNOSIS — Z794 Long term (current) use of insulin: Secondary | ICD-10-CM

## 2024-07-11 DIAGNOSIS — E1159 Type 2 diabetes mellitus with other circulatory complications: Secondary | ICD-10-CM

## 2024-07-11 DIAGNOSIS — Z7985 Long-term (current) use of injectable non-insulin antidiabetic drugs: Secondary | ICD-10-CM

## 2024-07-11 DIAGNOSIS — Z7984 Long term (current) use of oral hypoglycemic drugs: Secondary | ICD-10-CM

## 2024-07-11 DIAGNOSIS — E1165 Type 2 diabetes mellitus with hyperglycemia: Secondary | ICD-10-CM

## 2024-07-11 LAB — POCT GLYCOSYLATED HEMOGLOBIN (HGB A1C): Hemoglobin A1C: 6 % — AB (ref 4.0–5.6)

## 2024-07-11 MED ORDER — INSULIN GLARGINE 100 UNIT/ML SOLOSTAR PEN: 20.0000 [IU] | PEN_INJECTOR | Freq: Every day | SUBCUTANEOUS | Status: AC

## 2024-07-11 NOTE — Patient Instructions (Addendum)
 Please continue: - Metformin  1000 mg 2x a day, with meals - Jardiance  10 mg daily before b'fast - Trulicity  4.5 mg weekly  Reduce: - Lantus  25 units at bedtime (may need to decrease to 20 units if sugars remain controlled)  Please return in 4 months.

## 2024-07-11 NOTE — Progress Notes (Signed)
 Patient ID: [name], male   DOB: June 09, 1966, 58 y.o.   MRN: [id number]  HPI: Devon Jackson is a 58 y.o.-year-old male, initially referred by his PCP, Devon Comer POUR, NP, returning for follow-up for DM2, dx in 2010, insulin -dependent since 2022, uncontrolled, with complications (elevated calcium  score, aortic root dilation, ED). He is here with his wife, Devon Jackson, who is also my patient.  She offers part of the history including diet, activity, and medications.  Interim hx: No increased urination, blurry vision, nausea, chest pain.  He feels well, without complaints today.  Reviewed HbA1c: Lab Results  Component Value Date   HGBA1C 6.9 (A) 04/05/2024   HGBA1C 7.4 (A) 11/28/2023   HGBA1C 6.6 (A) 09/06/2023   HGBA1C 6.5 05/30/2023   HGBA1C 6.2 (A) 01/10/2023   HGBA1C 6.1 (A) 10/03/2022   HGBA1C 7.0 (H) 05/24/2022   HGBA1C 7.2 (A) 02/11/2022   HGBA1C 7.2 (A) 11/12/2021   HGBA1C 7.6 (H) 05/20/2021   Pt is on a regimen of: - Metformin  1000 mg 2x a day, with meals - Jardiance  10 mg before breakfast-added 03/2024 - Levemir  50-60 units >> ...30 >> 35 >> 40 >> Lantus  40 >> 35 >> 30 units daily - Trulicity  4.5 mg weekly - started 2023 He tried Bydureon >> did not work.  Pt checks his sugars 4x a day and they are:  Prev.:  Lowest sugar was 89 >> 55 (CGM); he has hypoglycemia awareness at 70.  Highest sugar was 289 >> 278.  Glucometer: Freestyle lite  Pt's meals are: - Breakfast: fast food - 2 eggs with bacon - Lunch: chicken strips + green beans - Dinner: meat + barley; meat + beans, cornbread - Snacks:2 - chips, popcorn He previously lost 265 >> 210 lbs on [employer].  - no CKD, last BUN/creatinine:  Lab Results  Component Value Date   BUN 19 05/31/2024   BUN 12 05/30/2023   CREATININE 1.02 05/31/2024   CREATININE 1.11 05/30/2023   Lab Results  Component Value Date   MICRALBCREAT 9 04/05/2024   - + HL; last set of lipids: Lab Results  Component Value  Date   CHOL 171 05/31/2024   HDL 32.00 (L) 05/31/2024   LDLCALC 95 05/31/2024   LDLDIRECT 114.0 11/17/2020   TRIG 217.0 (H) 05/31/2024   CHOLHDL 5 05/31/2024  He is on fenofibrate  145 mg daily.  He is statin intolerant (muscle aches - could barely walk; memory problems). She had an elevated calcium  score of 26.6 on 08/02/2023.  He sees cardiology.  He has aortic root dilation, frequent PVCs.  - last eye exam was in 03/2024. No DR retinopathy. Dr. Rudy.  - no numbness and tingling in his feet.  Last foot exam 11/28/2023.  Pt has no FH of DM.  He also has a history of OSA, GERD, migraines, MDD, insomnia. Cholecystectomy 2-3 years ago >> diarrhea.   ROS: + See HPI  Past Medical History:  Diagnosis Date   Allergic rhinitis    Allergy    Statins and bee stings   Frequent headaches    Gout    Hypertriglyceridemia    Sleep apnea    Cpap   Type 2 diabetes mellitus (HCC)    Past Surgical History:  Procedure Laterality Date   APPENDECTOMY     CHOLECYSTECTOMY     EXTRACORPOREAL SHOCK WAVE LITHOTRIPSY Left 09/02/2021   Procedure: LEFT EXTRACORPOREAL SHOCK WAVE LITHOTRIPSY (ESWL);  Surgeon: Gaston Hamilton, MD;  Location: [facility];  Service: Urology;  Laterality: Left;   LITHOTRIPSY  08/2021   POLYPECTOMY     ROTATOR CUFF REPAIR  07/26/2007   SEPTOPLASTY     Social History   Socioeconomic History   Marital status: Married    Spouse name: Not on file   Number of children: Not on file   Years of education: Not on file   Highest education level: 12th grade  Occupational History   Not on file  Tobacco Use   Smoking status: Never   Smokeless tobacco: Never  Vaping Use   Vaping status: Never Used  Substance and Sexual Activity   Alcohol use: Not Currently    Comment: rarely   Drug use: No   Sexual activity: Yes    Birth control/protection: Condom  Other Topics Concern   Not on file  Social History Narrative   Married.   3 children.   Works as a  financial planner.   Enjoys relaxing, spending time outdoors.   Social Drivers of Health   Tobacco Use: Low Risk (05/31/2024)   Patient History    Smoking Tobacco Use: Never    Smokeless Tobacco Use: Never    Passive Exposure: Not on file  Financial Resource Strain: Low Risk (05/27/2024)   Overall Financial Resource Strain (CARDIA)    Difficulty of Paying Living Expenses: Not hard at all  Food Insecurity: No Food Insecurity (05/27/2024)   Epic    Worried About Programme Researcher, Broadcasting/film/video in the Last Year: Never true    Ran Out of Food in the Last Year: Never true  Transportation Needs: No Transportation Needs (05/27/2024)   Epic    Lack of Transportation (Medical): No    Lack of Transportation (Non-Medical): No  Physical Activity: Insufficiently Active (05/27/2024)   Exercise Vital Sign    Days of Exercise per Week: 1 day    Minutes of Exercise per Session: 30 min  Stress: No Stress Concern Present (05/27/2024)   [employer] of Occupational Health - Occupational Stress Questionnaire    Feeling of Stress: Not at all  Social Connections: Socially Integrated (05/27/2024)   Social Connection and Isolation Panel    Frequency of Communication with Friends and Family: More than three times a week    Frequency of Social Gatherings with Friends and Family: Once a week    Attends Religious Services: More than 4 times per year    Active Member of Clubs or Organizations: Yes    Attends Banker Meetings: More than 4 times per year    Marital Status: Married  Catering Manager Violence: Not on file  Depression (PHQ2-9): Low Risk (05/31/2024)   Depression (PHQ2-9)    PHQ-2 Score: 0  Alcohol Screen: Low Risk (05/27/2024)   Alcohol Screen    Last Alcohol Screening Score (AUDIT): 0  Housing: Low Risk (05/27/2024)   Epic    Unable to Pay for Housing in the Last Year: No    Number of Times Moved in the Last Year: 0    Homeless in the Last Year: No  Utilities: Not on file  Health Literacy:  Not on file   Current Outpatient Medications on File Prior to Visit  Medication Sig Dispense Refill   albuterol  (VENTOLIN  HFA) 108 (90 Base) MCG/ACT inhaler USE 2 INHALATIONS EVERY 6 HOURS AS NEEDED 8 g 0   BD PEN NEEDLE NANO U/F 32G X 4 MM MISC USE TO INJECT INSULIN  100 each 11   blood glucose meter kit and supplies KIT  Dispense based on patient and insurance preference. Use up to 3 times daily as directed. (Dx is E11.9). 1 each 0   buPROPion  (WELLBUTRIN  XL) 300 MG 24 hr tablet TAKE 1 TABLET DAILY FOR DEPRESSION 90 tablet 0   clotrimazole-betamethasone (LOTRISONE) cream Apply topically.     cyclobenzaprine  (FLEXERIL ) 10 MG tablet Take 1 tablet by mouth once to twice daily as needed for severe headaches. 30 tablet 0   DEXILANT  60 MG capsule Take 1 capsule (60 mg total) by mouth daily. For heartburn. 90 capsule 0   empagliflozin  (JARDIANCE ) 10 MG TABS tablet Take 1 tablet (10 mg total) by mouth daily before breakfast. 90 tablet 3   EPINEPHrine  0.3 mg/0.3 mL IJ SOAJ injection Inject 0.3 mg into the muscle as needed for anaphylaxis. 2 each 0   fenofibrate  (TRICOR ) 145 MG tablet TAKE 1 TABLET DAILY FOR CHOLESTEROL 90 tablet 2   glucose blood (FREESTYLE LITE) test strip USE UP TO THREE TIMES A DAY AS DIRECTED 300 each 2   insulin  glargine (LANTUS ) 100 UNIT/ML Solostar Pen Inject 40 Units into the skin at bedtime. 30 mL 3   Lancets (FREESTYLE) lancets USE TO TEST BLOOD SUGAR 2 TO 3 TIMES A DAY AND AS DIRECTED 300 each 1   metFORMIN  (GLUCOPHAGE ) 1000 MG tablet TAKE 1 TABLET TWICE A DAY WITH MEALS FOR DIABETES 180 tablet 1   propranolol  ER (INDERAL  LA) 80 MG 24 hr capsule TAKE 1 CAPSULE AT BEDTIME FOR HEADACHE PREVENTION 90 capsule 0   sildenafil (VIAGRA) 100 MG tablet      SUMAtriptan  (IMITREX ) 100 MG tablet TAKE 1 TABLET AT MIGRAINE ONSET, MAY REPEAT IN 2 HOURS IF HEADACHE PERISISTS OR RECURS 9 tablet 0   tadalafil  (CIALIS ) 20 MG tablet TAKE 1 TABLET 30 MINUTES PRIOR TO INTERCOURSE AS NEEDED 30 tablet  0   TRULICITY  4.5 MG/0.5ML SOAJ INJECT 4.5 MG ONCE WEEKLY AS DIRECTED FOR DIABETES 6 mL 3   No current facility-administered medications on file prior to visit.   Allergies  Allergen Reactions   Bee Pollen Anaphylaxis   Bee Venom Anaphylaxis   Statins Other (See Comments)    Memory issues   Family History  Problem Relation Age of Onset   Alcohol abuse Mother    Diabetes Father    Breast cancer Paternal Grandmother    Lung cancer Paternal Grandfather    Cancer Paternal Grandfather    Heart disease Neg Hx    PE: BP 120/60   Pulse (!) 56   Ht 5' 8.5 (1.74 m)   Wt 226 lb 3.2 oz (102.6 kg)   SpO2 96%   BMI 33.89 kg/m  Wt Readings from Last 3 Encounters:  07/11/24 226 lb 3.2 oz (102.6 kg)  05/31/24 220 lb 4 oz (99.9 kg)  04/05/24 230 lb 12.8 oz (104.7 kg)   Constitutional: overweight, in NAD Eyes:  EOMI, no exophthalmos ENT: no neck masses, no cervical lymphadenopathy Cardiovascular: RRR, No MRG Respiratory: CTA B Musculoskeletal: no deformities Skin:no rashes Neurological: no tremor with outstretched hands  ASSESSMENT: 1. DM2, insulin -dependent, uncontrolled, without long-term complications  2. HL  PLAN:  1. Patient with long-standing, uncontrolled diabetes, on oral antidiabetic regimen with metformin , and also long-acting basal insulin  and weekly GLP-1/receptor agonist, whom I first saw 3 months ago.  At that time, HbA1c was lower, at 6.7%.  Reviewing the CGM trends, sugars are fluctuating within the upper half of the target range, with hyperglycemic excursions after lunch and dinner.  Upon questioning, he had  a poor diet, eating fast food for breakfast and lunch and snacking on chips and popcorn.  We discussed about improving diet and I made several suggestions.  He declined referral to nutrition at that time.  We also discussed about options for treatment to include an SGLT2 inhibitor or switching from Trulicity  to a stronger GLP-1 receptor agonist.  We decided to add  Jardiance  and continue Trulicity  at that time.  He was on Levemir  and we switched to Lantus  since Levemir  was taken out of the market.  He did have some diarrhea but not very bothersome.  He mentions that this started after his cholecystectomy 2 to 3 years prior.  I recommended a probiotic but he did not start. CGM interpretation: -At today's visit, we reviewed his CGM downloads: It appears that 94% of values are in target range (goal >70%), while 6% are higher than 180 (goal <25%), and 0% are lower than 70 (goal <4%).  The calculated average blood sugar is 125.  The projected HbA1c for the next 3 months (GMI) is 6.3%. -Reviewing the CGM trends, sugars appear to be fluctuating within the target range with only rare hyperglycemic exceptions.  There is a significant improvement in blood sugars since last visit when he had more hyperglycemic spikes.  He tells me that he had to decrease the dose of his Lantus  by 10 units since last visit due to lows.  At today's visit, we discussed that we can reduce the dose even more.  I am hoping that he could even come off in the future.  He is inquiring about side effects of and whether he should stop this, but I did not recommend this for now, especially as she tolerates it well.  He had diarrhea at last visit but he mentions that this is resolved. - I suggested to:  Patient Instructions  Please continue: - Metformin  1000 mg 2x a day, with meals - Jardiance  10 mg daily before b'fast - Trulicity  4.5 mg weekly  Reduce: - Lantus  25 units at bedtime (may need to decrease to 20 units if sugars remain controlled)  Please return in 4 months.  - we checked his HbA1c: 6% (lower) - advised to check sugars at different times of the day - 4x a day, rotating check times - advised for yearly eye exams >> he is UTD - return to clinic in 3-4 months  2. HL - Latest lipid panel was reviewed from last month: Triglycerides elevated, HDL low, LDL under 100: Lab Results   Component Value Date   CHOL 171 05/31/2024   HDL 32.00 (L) 05/31/2024   LDLCALC 95 05/31/2024   LDLDIRECT 114.0 11/17/2020   TRIG 217.0 (H) 05/31/2024   CHOLHDL 5 05/31/2024  - He is on fenofibrate  145 mg daily without side effects.  He had memory problems on statins in the past. We discussed that we have other options to lower LDL including ezetimibe , bempedoic acid, PCSK9 inhibitors, inclisiran.  However, the newest agents are approved only for secondary prevention.  Lela Fendt, MD PhD [facility] Endocrinology

## 2024-07-20 ENCOUNTER — Encounter: Payer: Self-pay | Admitting: Internal Medicine

## 2024-07-20 DIAGNOSIS — E1165 Type 2 diabetes mellitus with hyperglycemia: Secondary | ICD-10-CM

## 2024-07-22 MED ORDER — FREESTYLE LANCETS MISC: 1 refills | Status: DC

## 2024-07-22 MED ORDER — FREESTYLE LANCETS MISC: 1 refills | Status: AC

## 2024-07-22 MED ORDER — FREESTYLE LITE TEST VI STRP: ORAL_STRIP | 2 refills | Status: DC

## 2024-07-22 MED ORDER — FREESTYLE LITE TEST VI STRP: ORAL_STRIP | 2 refills | Status: AC

## 2024-07-22 NOTE — Addendum Note (Signed)
 Addended by: CLEOTILDE ROLIN RAMAN on: 07/22/2024 12:34 PM   Modules accepted: Orders

## 2024-07-26 ENCOUNTER — Other Ambulatory Visit: Payer: Self-pay | Admitting: Primary Care

## 2024-07-26 DIAGNOSIS — E1165 Type 2 diabetes mellitus with hyperglycemia: Secondary | ICD-10-CM

## 2024-08-05 ENCOUNTER — Other Ambulatory Visit: Payer: Self-pay | Admitting: Primary Care

## 2024-08-05 DIAGNOSIS — F3342 Major depressive disorder, recurrent, in full remission: Secondary | ICD-10-CM

## 2024-08-05 DIAGNOSIS — G43709 Chronic migraine without aura, not intractable, without status migrainosus: Secondary | ICD-10-CM

## 2024-08-05 MED ORDER — SUMATRIPTAN SUCCINATE 100 MG PO TABS: ORAL_TABLET | ORAL | 0 refills | Status: AC

## 2024-08-12 ENCOUNTER — Telehealth: Payer: Self-pay | Admitting: Internal Medicine

## 2024-08-12 NOTE — Telephone Encounter (Signed)
 Patient came in to office today and brought a [place].-Insulin -Treated Diabetes Mellitus Assessment Form to be completed.  The form is in Dr. Ara folder in the front office.

## 2024-08-21 DIAGNOSIS — B353 Tinea pedis: Secondary | ICD-10-CM

## 2024-08-22 MED ORDER — CLOTRIMAZOLE-BETAMETHASONE 1-0.05 % EX CREA: TOPICAL_CREAM | Freq: Two times a day (BID) | CUTANEOUS | 0 refills | Status: AC | PRN

## 2024-08-27 ENCOUNTER — Other Ambulatory Visit: Payer: Self-pay

## 2024-08-27 DIAGNOSIS — R519 Headache, unspecified: Secondary | ICD-10-CM

## 2024-08-27 DIAGNOSIS — G43709 Chronic migraine without aura, not intractable, without status migrainosus: Secondary | ICD-10-CM

## 2024-08-27 MED ORDER — PROPRANOLOL HCL ER 80 MG PO CP24: 80.0000 mg | ORAL_CAPSULE | Freq: Every day | ORAL | 2 refills | Status: AC

## 2024-10-01 ENCOUNTER — Ambulatory Visit

## 2024-11-11 ENCOUNTER — Ambulatory Visit: Admitting: Internal Medicine

## 2025-06-03 ENCOUNTER — Encounter: Admitting: Primary Care
